# Patient Record
Sex: Female | Born: 2000 | Hispanic: Yes | Marital: Married | Smoking: Never smoker
Health system: Southern US, Community
[De-identification: ages and names within clinical notes are randomized; demographics above are authoritative.]

## PROBLEM LIST (undated history)

## (undated) DIAGNOSIS — H814 Vertigo of central origin: Secondary | ICD-10-CM

## (undated) DIAGNOSIS — J45909 Unspecified asthma, uncomplicated: Secondary | ICD-10-CM

## (undated) DIAGNOSIS — R55 Syncope and collapse: Secondary | ICD-10-CM

## (undated) DIAGNOSIS — F419 Anxiety disorder, unspecified: Secondary | ICD-10-CM

## (undated) DIAGNOSIS — J329 Chronic sinusitis, unspecified: Secondary | ICD-10-CM

## (undated) DIAGNOSIS — G8929 Other chronic pain: Secondary | ICD-10-CM

## (undated) DIAGNOSIS — G43829 Menstrual migraine, not intractable, without status migrainosus: Secondary | ICD-10-CM

## (undated) DIAGNOSIS — F32A Depression, unspecified: Secondary | ICD-10-CM

## (undated) DIAGNOSIS — G4730 Sleep apnea, unspecified: Secondary | ICD-10-CM

## (undated) DIAGNOSIS — A64 Unspecified sexually transmitted disease: Secondary | ICD-10-CM

## (undated) DIAGNOSIS — M5430 Sciatica, unspecified side: Secondary | ICD-10-CM

## (undated) DIAGNOSIS — G43839 Menstrual migraine, intractable, without status migrainosus: Secondary | ICD-10-CM

## (undated) DIAGNOSIS — R519 Headache, unspecified: Secondary | ICD-10-CM

## (undated) DIAGNOSIS — F410 Panic disorder [episodic paroxysmal anxiety] without agoraphobia: Secondary | ICD-10-CM

## (undated) DIAGNOSIS — K2950 Unspecified chronic gastritis without bleeding: Secondary | ICD-10-CM

## (undated) DIAGNOSIS — T7840XA Allergy, unspecified, initial encounter: Secondary | ICD-10-CM

## (undated) HISTORY — DX: Menstrual migraine, intractable, without status migrainosus: G43.839

## (undated) HISTORY — DX: Unspecified sexually transmitted disease: A64

## (undated) HISTORY — DX: Sleep apnea, unspecified: G47.30

## (undated) HISTORY — DX: Anxiety disorder, unspecified: F41.9

## (undated) HISTORY — DX: Depression, unspecified: F32.A

## (undated) HISTORY — DX: Chronic sinusitis, unspecified: J32.9

## (undated) HISTORY — DX: Vertigo of central origin: H81.4

## (undated) HISTORY — DX: Sciatica, unspecified side: M54.30

## (undated) HISTORY — DX: Allergy, unspecified, initial encounter: T78.40XA

## (undated) HISTORY — DX: Syncope and collapse: R55

---
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
## 2018-01-07 ENCOUNTER — Encounter

## 2018-01-07 ENCOUNTER — Ambulatory Visit: Payer: Medicaid Other | Admitting: Neurology

## 2018-01-08 DIAGNOSIS — R569 Unspecified convulsions: Secondary | ICD-10-CM | POA: Insufficient documentation

## 2018-06-24 ENCOUNTER — Ambulatory Visit
Admission: EM | Admit: 2018-06-24 | Discharge: 2018-06-24 | Disposition: A | Discharge status: Home / Self Care | Payer: 59 | Attending: Family Medicine | Admitting: Family Medicine

## 2018-06-24 DIAGNOSIS — R110 Nausea: Secondary | ICD-10-CM | POA: Diagnosis not present

## 2018-06-24 MED ORDER — ONDANSETRON HCL 4 MG PO TABS: 4.0000 mg | ORAL_TABLET | Freq: Four times a day (QID) | ORAL | 0 refills | Status: DC

## 2018-06-24 NOTE — ED Provider Notes (Signed)
[facility]    CSN: [id number] Arrival date & time: 06/24/18  1623     History   Chief Complaint Chief Complaint  Patient presents with  . Emesis    HPI Shakeria Opie is a 18 y.o. female.   HPI  Patient states that she woke up and had breakfast this morning.  She felt well.  As the day went on she started having severe nausea.  She has not had any vomiting but does not feel like she could eat today.  She has been keeping down some water.  No fever chills.  No runny nose.  No cough.  No body aches or flu symptoms.  Past Medical History:  Diagnosis Date  . Asthma   . Menstrual migraine, intractable, without status migrainosus   . Panic attack   . Sinusitis   . Syncope and collapse   . Vertigo of central origin, unspecified ear     Patient Active Problem List   Diagnosis Date Noted  . 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 Asthma 10/27/2016    History reviewed. No pertinent surgical history.  OB History   No obstetric history on file.      Home Medications    Prior to Admission medications   Not on File    Family History Family History  Problem Relation Age of Onset  . Hypertension Mother   . Liver disease Mother   . Other Mother        GENETIC (INHERITED) DISORDER  . Hypertension Father   . Diabetes Father   . Cancer Paternal Grandmother   . Diabetes Paternal Grandmother   . Hypertension Paternal Grandmother   . High Cholesterol Paternal Grandmother   . Cancer Paternal Grandfather   . Diabetes Paternal Grandfather   . Hypertension Paternal Grandfather   . High Cholesterol Paternal Grandfather     Social History Social History   Tobacco Use  . Smoking status: Never Smoker  . Smokeless tobacco: Never Used  Substance Use Topics  . Alcohol use: No    Frequency: Never  . Drug use: No     Allergies   Patient has no known allergies.   Review of Systems Review of Systems  Constitutional: Negative for chills and  fever.  HENT: Negative for ear pain and sore throat.   Eyes: Negative for pain and visual disturbance.  Respiratory: Negative for cough and shortness of breath.   Cardiovascular: Negative for chest pain and palpitations.  Gastrointestinal: Positive for nausea. Negative for abdominal pain and vomiting.  Genitourinary: Negative for dysuria and hematuria.  Musculoskeletal: Negative for arthralgias and back pain.  Skin: Negative for color change and rash.  Neurological: Negative for seizures and syncope.  All other systems reviewed and are negative.    Physical Exam Triage Vital Signs ED Triage Vitals [06/24/18 1632]  Enc Vitals Group     BP 120/78     Pulse Rate 76     Resp 18     Temp 98.2 F (36.8 C)     Temp Source Oral     SpO2 98 %     Weight      Height      Head Circumference      Peak Flow      Pain Score 9     Pain Loc      Pain Edu?      Excl. in GC?    No data found.  Updated Vital Signs BP 120/78 (BP  Location: Right Arm)   Pulse 76   Temp 98.2 F (36.8 C) (Oral)   Resp 18   LMP 06/03/2018   SpO2 98%   Visual Acuity Right Eye Distance:   Left Eye Distance:   Bilateral Distance:    Right Eye Near:   Left Eye Near:    Bilateral Near:     Physical Exam Constitutional:      General: She is not in acute distress.    Appearance: She is well-developed.  HENT:     Head: Normocephalic and atraumatic.     Right Ear: Tympanic membrane and ear canal normal.     Left Ear: Tympanic membrane and ear canal normal.     Nose: Nose normal.     Mouth/Throat:     Mouth: Mucous membranes are moist.     Pharynx: No posterior oropharyngeal erythema.  Eyes:     Conjunctiva/sclera: Conjunctivae normal.     Pupils: Pupils are equal, round, and reactive to light.  Neck:     Musculoskeletal: Normal range of motion.  Cardiovascular:     Rate and Rhythm: Normal rate and regular rhythm.     Heart sounds: Normal heart sounds.  Pulmonary:     Effort: Pulmonary effort  is normal. No respiratory distress.     Breath sounds: Normal breath sounds.  Abdominal:     General: There is no distension.     Palpations: Abdomen is soft.  Musculoskeletal: Normal range of motion.  Skin:    General: Skin is warm and dry.  Neurological:     Mental Status: She is alert. Mental status is at baseline.      UC Treatments / Results  Labs (all labs ordered are listed, but only abnormal results are displayed) Labs Reviewed - No data to display  EKG None  Radiology No results found.  Procedures Procedures (including critical care time)  Medications Ordered in UC Medications - No data to display  Initial Impression / Assessment and Plan / UC Course  I have reviewed the triage vital signs and the nursing notes.  Pertinent labs & imaging results that were available during my care of the patient were reviewed by me and considered in my medical decision making (see chart for details).     I was described to the patient I believe she has a viral illness.  Reviewed clear liquids.  Advance to bland diet.  At that point she asked for a blood pregnancy test.  Apparently she had some unprotected sex in the last couple of weeks.  Her next period is not even due for 2 weeks.  I told her it was too soon to get a pregnancy test.  Recommended safe sex.  Pregnancy testing of her.  Does not come as expected Final Clinical Impressions(s) / UC Diagnoses   Final diagnoses:  Nausea without vomiting     Discharge Instructions     Drink plenty of fluids Take zofran as needed nausea Return if not better in a few days or call your PCP    ED Prescriptions    None     Controlled Substance Prescriptions [place] Controlled Substance Registry consulted? Not Applicable   Eustace MooreNelson, Chareese Sergent Sue, MD 06/24/18 [id number]

## 2018-06-24 NOTE — Discharge Instructions (Addendum)
Drink plenty of fluids Take zofran as needed nausea Return if not better in a few days or call your PCP

## 2018-06-24 NOTE — ED Triage Notes (Signed)
Pt c/o n/v x10hrs, able to keep only water down. C/o chest tightness when coughing

## 2018-07-21 ENCOUNTER — Encounter (HOSPITAL_COMMUNITY): Payer: Self-pay

## 2018-07-21 ENCOUNTER — Other Ambulatory Visit: Payer: Self-pay

## 2018-07-21 ENCOUNTER — Ambulatory Visit (HOSPITAL_COMMUNITY)
Admission: EM | Admit: 2018-07-21 | Discharge: 2018-07-21 | Disposition: A | Discharge status: Home / Self Care | Payer: 59 | Attending: Family Medicine | Admitting: Family Medicine

## 2018-07-21 DIAGNOSIS — R05 Cough: Secondary | ICD-10-CM

## 2018-07-21 DIAGNOSIS — Z3202 Encounter for pregnancy test, result negative: Secondary | ICD-10-CM | POA: Diagnosis not present

## 2018-07-21 DIAGNOSIS — B9789 Other viral agents as the cause of diseases classified elsewhere: Secondary | ICD-10-CM | POA: Diagnosis not present

## 2018-07-21 DIAGNOSIS — J029 Acute pharyngitis, unspecified: Secondary | ICD-10-CM | POA: Diagnosis not present

## 2018-07-21 DIAGNOSIS — R1030 Lower abdominal pain, unspecified: Secondary | ICD-10-CM | POA: Diagnosis not present

## 2018-07-21 DIAGNOSIS — R112 Nausea with vomiting, unspecified: Secondary | ICD-10-CM | POA: Insufficient documentation

## 2018-07-21 DIAGNOSIS — J069 Acute upper respiratory infection, unspecified: Secondary | ICD-10-CM | POA: Diagnosis not present

## 2018-07-21 LAB — POCT URINALYSIS DIP (DEVICE)
BILIRUBIN URINE: NEGATIVE
Glucose, UA: NEGATIVE mg/dL
HGB URINE DIPSTICK: NEGATIVE
KETONES UR: NEGATIVE mg/dL
Leukocytes,Ua: NEGATIVE
NITRITE: NEGATIVE
PH: 5.5 (ref 5.0–8.0)
Protein, ur: NEGATIVE mg/dL
SPECIFIC GRAVITY, URINE: 1.025 (ref 1.005–1.030)
Urobilinogen, UA: 0.2 mg/dL (ref 0.0–1.0)

## 2018-07-21 LAB — POCT PREGNANCY, URINE: Preg Test, Ur: NEGATIVE

## 2018-07-21 LAB — POCT RAPID STREP A: Streptococcus, Group A Screen (Direct): NEGATIVE

## 2018-07-21 MED ORDER — ONDANSETRON 4 MG PO TBDP: 4.0000 mg | ORAL_TABLET | Freq: Three times a day (TID) | ORAL | 0 refills | Status: DC | PRN

## 2018-07-21 MED ORDER — BENZONATATE 200 MG PO CAPS: 200.0000 mg | ORAL_CAPSULE | Freq: Three times a day (TID) | ORAL | 0 refills | Status: AC | PRN

## 2018-07-21 MED ORDER — CETIRIZINE HCL 10 MG PO CAPS: 10.0000 mg | ORAL_CAPSULE | Freq: Every day | ORAL | 0 refills | Status: DC

## 2018-07-21 NOTE — ED Triage Notes (Signed)
Pt presents to [facility] for cough and abdominal pain since yesterday 07/20/2018, pt also complains of pain in kidney area, nausea, vomiting and loss of appetite

## 2018-07-21 NOTE — Discharge Instructions (Signed)
Your urine was normal; strep test negative Your symptoms are most likely from a viral illness, symptoms should resolve on their own and next 5 to 7 days Please use Zofran, dissolved underneath time, use as needed for nausea and vomiting For your cough you may use Tessalon or over-the-counter Delsym, Robitussin, Mucinex DM Begin daily cetirizine to help with nasal congestion and drainage that may be contributing to sore throat Please push fluids, may use Pedialyte or Gatorade to supplement if not eating a lot of solid foods Please continue to monitor your abdominal pain, follow-up if pain worsening; follow-up with developing persistent vomiting, unable to eat or drink

## 2018-07-22 NOTE — ED Provider Notes (Signed)
[facility]    CSN: [id number] Arrival date & time: 07/21/18  1644     History   Chief Complaint Chief Complaint  Patient presents with  . Cough  . Abdominal Pain    HPI Rebecca Rocha is a 18 y.o. female history of asthma, presenting today for evaluation of cough, abdominal pain and nausea and vomiting.  Patient states that symptoms began yesterday.  She has had cough with mild rhinorrhea, does also endorse sore throat.  She has also had associated abdominal discomfort with nausea and vomiting.  One episode of vomiting yesterday, denies vomiting today.  She has had associated headaches.  Denies any urinary symptoms of increased frequency had one episode of dysuria yesterday but this is not been persistent.  Denies hematuria.  Denies history of kidney stones but does have family history of kidney stones.  Denies any vaginal discharge or pelvic pain.  Last menstrual period 2/27.  She is not currently on any form of birth control as she is transitioning from OCPs and wanting to have the Nexplanon.  She notes that she did have a fever of 100.8 earlier, but has not taken anything for the fever.  She has not taken any thing for symptoms.  HPI  Past Medical History:  Diagnosis Date  . Asthma   . Menstrual migraine, intractable, without status migrainosus   . Panic attack   . Sinusitis   . Syncope and collapse   . Vertigo of central origin, unspecified ear     Patient Active Problem List   Diagnosis Date Noted  . 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 Asthma 10/27/2016    History reviewed. No pertinent surgical history.  OB History   No obstetric history on file.      Home Medications    Prior to Admission medications   Medication Sig Start Date End Date Taking? Authorizing Provider  ondansetron (ZOFRAN) 4 MG tablet Take 1 tablet (4 mg total) by mouth every 6 (six) hours. 06/24/18  Yes Eustace Moore, MD  benzonatate (TESSALON) 200 MG capsule Take 1  capsule (200 mg total) by mouth 3 (three) times daily as needed for up to 7 days for cough. 07/21/18 07/28/18  Jordin Vicencio C, PA-C  Cetirizine HCl 10 MG CAPS Take 1 capsule (10 mg total) by mouth daily for 10 days. 07/21/18 07/31/18  Niley Helbig C, PA-C  ondansetron (ZOFRAN ODT) 4 MG disintegrating tablet Take 1 tablet (4 mg total) by mouth every 8 (eight) hours as needed for nausea or vomiting. 07/21/18   Baneen Wieseler, Junius Creamer, PA-C    Family History Family History  Problem Relation Age of Onset  . Hypertension Mother   . Liver disease Mother   . Other Mother        GENETIC (INHERITED) DISORDER  . Hypertension Father   . Diabetes Father   . Cancer Paternal Grandmother   . Diabetes Paternal Grandmother   . Hypertension Paternal Grandmother   . High Cholesterol Paternal Grandmother   . Cancer Paternal Grandfather   . Diabetes Paternal Grandfather   . Hypertension Paternal Grandfather   . High Cholesterol Paternal Grandfather     Social History Social History   Tobacco Use  . Smoking status: Never Smoker  . Smokeless tobacco: Never Used  Substance Use Topics  . Alcohol use: No    Frequency: Never  . Drug use: No     Allergies   Patient has no known allergies.   Review of Systems  Review of Systems  Constitutional: Positive for appetite change, fatigue and fever. Negative for activity change and chills.  HENT: Positive for congestion, rhinorrhea and sore throat. Negative for ear pain, sinus pressure and trouble swallowing.   Eyes: Negative for discharge and redness.  Respiratory: Positive for cough. Negative for chest tightness and shortness of breath.   Cardiovascular: Negative for chest pain.  Gastrointestinal: Positive for abdominal pain, nausea and vomiting. Negative for diarrhea.  Musculoskeletal: Negative for myalgias.  Skin: Negative for rash.  Neurological: Negative for dizziness, light-headedness and headaches.     Physical Exam Triage Vital Signs ED Triage  Vitals  Enc Vitals Group     BP 07/21/18 1731 113/72     Pulse Rate 07/21/18 1731 79     Resp 07/21/18 1731 17     Temp 07/21/18 1731 97.9 F (36.6 C)     Temp Source 07/21/18 1731 Oral     SpO2 07/21/18 1731 100 %     Weight --      Height --      Head Circumference --      Peak Flow --      Pain Score 07/21/18 1733 9     Pain Loc --      Pain Edu? --      Excl. in GC? --    No data found.  Updated Vital Signs BP 113/72 (BP Location: Left Arm)   Pulse 79   Temp 97.9 F (36.6 C) (Oral)   Resp 17   LMP 07/11/2018 (Exact Date)   SpO2 100%   Visual Acuity Right Eye Distance:   Left Eye Distance:   Bilateral Distance:    Right Eye Near:   Left Eye Near:    Bilateral Near:     Physical Exam Vitals signs and nursing note reviewed.  Constitutional:      General: She is not in acute distress.    Appearance: She is well-developed.  HENT:     Head: Normocephalic and atraumatic.     Ears:     Comments: Bilateral ears without tenderness to palpation of external auricle, tragus and mastoid, EAC's without erythema or swelling, TM's with good bony landmarks and cone of light. Non erythematous.     Mouth/Throat:     Comments: Oral mucosa pink and moist, no tonsillar enlargement or exudate. Posterior pharynx patent and nonerythematous, no uvula deviation or swelling. Normal phonation. Eyes:     Conjunctiva/sclera: Conjunctivae normal.  Neck:     Musculoskeletal: Neck supple.  Cardiovascular:     Rate and Rhythm: Normal rate and regular rhythm.     Heart sounds: No murmur.  Pulmonary:     Effort: Pulmonary effort is normal. No respiratory distress.     Breath sounds: Normal breath sounds.     Comments: Breathing comfortably at rest, CTABL, no wheezing, rales or other adventitious sounds auscultated Abdominal:     Palpations: Abdomen is soft.     Tenderness: There is abdominal tenderness.     Comments: Mild tenderness to bilateral lower quadrants of abdomen, negative  rebound, negative McBurney's, negative Rovsing  Skin:    General: Skin is warm and dry.  Neurological:     Mental Status: She is alert.      UC Treatments / Results  Labs (all labs ordered are listed, but only abnormal results are displayed) Labs Reviewed  CULTURE, GROUP A STREP [facility])  POCT URINALYSIS DIP (DEVICE)  POCT PREGNANCY, URINE  POCT RAPID STREP A  EKG None  Radiology No results found.  Procedures Procedures (including critical care time)  Medications Ordered in UC Medications - No data to display  Initial Impression / Assessment and Plan / UC Course  I have reviewed the triage vital signs and the nursing notes.  Pertinent labs & imaging results that were available during my care of the patient were reviewed by me and considered in my medical decision making (see chart for details).     UA negative.  Most likely viral URI, viral gastroenteritis.  She has not had persistent vomiting or diarrhea.  Possible influenza with constellation of symptoms but no fever in clinic today.  Abdominal exam negative for peritoneal signs.  Lungs clear.  Strep test negative.  At this time will treat symptomatically and supportively for likely viral illness.  Recommendations below, provided Zofran to use as needed for nausea, push fluids.Discussed strict return precautions. Patient verbalized understanding and is agreeable with plan.  Final Clinical Impressions(s) / UC Diagnoses   Final diagnoses:  Non-intractable vomiting with nausea, unspecified vomiting type  Lower abdominal pain  Viral URI with cough     Discharge Instructions     Your urine was normal; strep test negative Your symptoms are most likely from a viral illness, symptoms should resolve on their own and next 5 to 7 days Please use Zofran, dissolved underneath time, use as needed for nausea and vomiting For your cough you may use Tessalon or over-the-counter Delsym, Robitussin, Mucinex DM Begin daily  cetirizine to help with nasal congestion and drainage that may be contributing to sore throat Please push fluids, may use Pedialyte or Gatorade to supplement if not eating a lot of solid foods Please continue to monitor your abdominal pain, follow-up if pain worsening; follow-up with developing persistent vomiting, unable to eat or drink   ED Prescriptions    Medication Sig Dispense Auth. Provider   benzonatate (TESSALON) 200 MG capsule Take 1 capsule (200 mg total) by mouth 3 (three) times daily as needed for up to 7 days for cough. 28 capsule Brayden Betters C, PA-C   Cetirizine HCl 10 MG CAPS Take 1 capsule (10 mg total) by mouth daily for 10 days. 10 capsule Mirayah Wren C, PA-C   ondansetron (ZOFRAN ODT) 4 MG disintegrating tablet Take 1 tablet (4 mg total) by mouth every 8 (eight) hours as needed for nausea or vomiting. 20 tablet Nayellie Sanseverino, EddystoneHallie C, PA-C     Controlled Substance Prescriptions [place] Controlled Substance Registry consulted? Not Applicable   Lew DawesWieters, Suman Trivedi C, [place]PA-C 07/22/18 1051

## 2018-07-23 LAB — CULTURE, GROUP A STREP (THRC)

## 2018-07-24 ENCOUNTER — Telehealth (HOSPITAL_COMMUNITY): Payer: Self-pay | Admitting: Emergency Medicine

## 2018-07-24 NOTE — Telephone Encounter (Signed)
Culture is positive for non group A Strep germ.  This is a finding of uncertain significance; not the typical 'strep throat' germ.  Attempted to reach patient. No answer at this time.  

## 2018-08-13 ENCOUNTER — Ambulatory Visit: Payer: Self-pay | Admitting: Obstetrics & Gynecology

## 2018-08-13 ENCOUNTER — Other Ambulatory Visit: Payer: Self-pay

## 2018-08-15 ENCOUNTER — Ambulatory Visit (INDEPENDENT_AMBULATORY_CARE_PROVIDER_SITE_OTHER): Payer: 59 | Admitting: Obstetrics & Gynecology

## 2018-08-15 ENCOUNTER — Encounter: Payer: Self-pay | Admitting: Obstetrics & Gynecology

## 2018-08-15 ENCOUNTER — Other Ambulatory Visit: Payer: Self-pay

## 2018-08-15 VITALS — BP 112/70 | Ht 60.75 in | Wt 142.0 lb

## 2018-08-15 DIAGNOSIS — Z3009 Encounter for other general counseling and advice on contraception: Secondary | ICD-10-CM | POA: Diagnosis not present

## 2018-08-15 DIAGNOSIS — Z113 Encounter for screening for infections with a predominantly sexual mode of transmission: Secondary | ICD-10-CM

## 2018-08-15 DIAGNOSIS — Z01419 Encounter for gynecological examination (general) (routine) without abnormal findings: Secondary | ICD-10-CM

## 2018-08-15 NOTE — Progress Notes (Signed)
Rebecca Rocha 07-Aug-2000 [id number]   History:    18 y.o. G0 Single.  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  RP:  New patient presenting for annual gyn exam   HPI: Normal menstrual periods every 3 to 5 weeks with normal flow.  No breakthrough bleeding.  No pelvic pain.  No pain with intercourse.  Strict condom use.  Would like to have contraception, but difficult compliance with birth control pills in the past, thinking of Nexplanon.  Breast normal.  Urine and bowel movements normal.  Body mass index 27.05.  Physically active.  Past medical history,surgical history, family history and social history were all reviewed and documented in the EPIC chart.  Gynecologic History Patient's last menstrual period was 07/28/2018. Contraception: condoms Last Pap: Never Last mammogram: Never Bone Density: Never Colonoscopy: Never  Obstetric History OB History  Gravida Para Term Preterm AB Living  0 0 0 0 0 0  SAB TAB Ectopic Multiple Live Births  0 0 0 0 0     ROS: A ROS was performed and pertinent positives and negatives are included in the history.  GENERAL: No fevers or chills. HEENT: No change in vision, no earache, sore throat or sinus congestion. NECK: No pain or stiffness. CARDIOVASCULAR: No chest pain or pressure. No palpitations. PULMONARY: No shortness of breath, cough or wheeze. GASTROINTESTINAL: No abdominal pain, nausea, vomiting or diarrhea, melena or bright red blood per rectum. GENITOURINARY: No urinary frequency, urgency, hesitancy or dysuria. MUSCULOSKELETAL: No joint or muscle pain, no back pain, no recent trauma. DERMATOLOGIC: No rash, no itching, no lesions. ENDOCRINE: No polyuria, polydipsia, no heat or cold intolerance. No recent change in weight. HEMATOLOGICAL: No anemia or easy bruising or bleeding. NEUROLOGIC: No headache, seizures, numbness, tingling or weakness. PSYCHIATRIC: No depression, no loss of interest in normal activity or change in sleep  pattern.     Exam:   BP 112/70   Ht 5' 0.75" (1.543 m)   Wt 142 lb (64.4 kg)   LMP 07/28/2018   BMI 27.05 kg/m   Body mass index is 27.05 kg/m.  General appearance : Well developed well nourished female. No acute distress HEENT: Eyes: no retinal hemorrhage or exudates,  Neck supple, trachea midline, no carotid bruits, no thyroidmegaly Lungs: Clear to auscultation, no rhonchi or wheezes, or rib retractions  Heart: Regular rate and rhythm, no murmurs or gallops Breast:Examined in sitting and supine position were symmetrical in appearance, no palpable masses or tenderness,  no skin retraction, no nipple inversion, no nipple discharge, no skin discoloration, no axillary or supraclavicular lymphadenopathy Abdomen: no palpable masses or tenderness, no rebound or guarding Extremities: no edema or skin discoloration or tenderness  Pelvic: Vulva: Normal             Vagina: No gross lesions or discharge  Cervix: No gross lesions or discharge.  Gono-Chlam done.  Uterus  AV, normal size, shape and consistency, non-tender and mobile  Adnexa  Without masses or tenderness  Anus: Normal   Assessment/Plan:  18 y.o. female for annual exam   1. Well female exam with routine gynecological exam Normal gynecologic exam.  Will start Pap test at age 58.  Breast exam normal.  HPV immunization discussed and pamphlet given, if not done previously patient will follow-up for first injection.  2. Encounter for other general counseling or advice on contraception Contraceptive counseling done.  Decision to proceed with Nexplanon.  Strict condom use until then and patient will  follow-up on next menstrual period.  Usage, insertion, risks and benefits of Nexplanon reviewed.  Nexplanon pamphlet given.  3. Screen for STD (sexually transmitted disease) Strict condom use recommended. - HIV antibody (with reflex) - RPR - Hepatitis C Antibody - Hepatitis B Surface AntiGEN - C. trachomatis/N. gonorrhoeae RNA   [name] MD, 9:35 AM 08/15/2018

## 2018-08-15 NOTE — Patient Instructions (Signed)
1. Well female exam with routine gynecological exam Normal gynecologic exam.  Will start Pap test at age 18.  Breast exam normal.  HPV immunization discussed and pamphlet given, if not done previously patient will follow-up for first injection.  2. Encounter for other general counseling or advice on contraception Contraceptive counseling done.  Decision to proceed with Nexplanon.  Strict condom use until then and patient will follow-up on next menstrual period.  Usage, insertion, risks and benefits of Nexplanon reviewed.  Nexplanon pamphlet given.  3. Screen for STD (sexually transmitted disease) Strict condom use recommended. - HIV antibody (with reflex) - RPR - Hepatitis C Antibody - Hepatitis B Surface AntiGEN - C. trachomatis/N. gonorrhoeae RNA  Rebecca Rocha, it was a pleasure seeing you today!  I will inform you of your results as soon as they are available.

## 2018-08-16 LAB — RPR: RPR Ser Ql: NONREACTIVE

## 2018-08-16 LAB — C. TRACHOMATIS/N. GONORRHOEAE RNA
C. trachomatis RNA, TMA: NOT DETECTED
N. gonorrhoeae RNA, TMA: NOT DETECTED

## 2018-08-16 LAB — HEPATITIS C ANTIBODY
Hepatitis C Ab: NONREACTIVE
SIGNAL TO CUT-OFF: 0.02 (ref <1.00)

## 2018-08-16 LAB — HIV ANTIBODY (ROUTINE TESTING W REFLEX): HIV 1&2 Ab, 4th Generation: NONREACTIVE

## 2018-08-16 LAB — HEPATITIS B SURFACE ANTIGEN: Hepatitis B Surface Ag: NONREACTIVE

## 2018-08-28 ENCOUNTER — Other Ambulatory Visit: Payer: Self-pay

## 2018-08-30 ENCOUNTER — Other Ambulatory Visit: Payer: Self-pay

## 2018-08-30 ENCOUNTER — Encounter: Payer: Self-pay | Admitting: Obstetrics & Gynecology

## 2018-08-30 ENCOUNTER — Ambulatory Visit (INDEPENDENT_AMBULATORY_CARE_PROVIDER_SITE_OTHER): Payer: 59 | Admitting: Obstetrics & Gynecology

## 2018-08-30 DIAGNOSIS — Z30017 Encounter for initial prescription of implantable subdermal contraceptive: Secondary | ICD-10-CM | POA: Diagnosis not present

## 2018-08-30 NOTE — Patient Instructions (Signed)
1. Nexplanon insertion Contraception with Nexplanon decided with counseling at previous visit.  Patient on her normal menstrual period currently.  Strict condom use.  Nexplanon insertion procedure reviewed with patient.  Easy insertion of Nexplanon without complication.  Well-tolerated by patient.  Post Nexplanon insertion precautions discussed.  Rebecca Rocha, it was a pleasure seeing you today!

## 2018-08-30 NOTE — Progress Notes (Signed)
° ° °  Rebecca Rocha 2000/06/11 [id number]        18 y.o.  G0 Single.  Boyfriend x 1 year  RP: Nexplanon insertion  HPI: LMP 08/26/2018, heavier flow x yesterday.  No pelvic pain.  No vaginal discharge.  No change x Annual/Gyn exam 08/15/2018.   OB History  Gravida Para Term Preterm AB Living  0 0 0 0 0 0  SAB TAB Ectopic Multiple Live Births  0 0 0 0 0    Past medical history,surgical history, problem list, medications, allergies, family history and social history were all reviewed and documented in the EPIC chart.   Directed ROS with pertinent positives and negatives documented in the history of present illness/assessment and plan.  Exam:  There were no vitals filed for this visit. General appearance:  Normal                                              Nexplanon Procedure Note (insertion)   The patient was laying on her back with her nondominant arm flexed at the elbow and externally rotated. The insertion site was identified as the underside of the nondominant upper arm approximately 8 cm from the medial epicondyle of the humerus.  A mark was made with a sterile marker at the spot where the Nexplanon implant will be inserted. The area was cleansed with Betadine solution. The area was anesthetized with 1% lidocaine  (1 cc)  at the area the injection site and underneath the skin along the planned insertion tunnel. The preloaded disposable Nexplanon was removed from its sterile casing.  The applicator was held above the needle at the textured surface area. The transparent protector was removed. With a freehand, the skin was stretched around the insertion site with a thumb and index finger. The skin was then punctured with the tip of the needle angled at 30. The Nexplanon applicator was lowered to a horizontal position. While lifting the skin with the tip of the needle the needle was then slid to its full length. The applicator was kept in this position with the needle inserted to  its full length. The purple slider was unlocked by pushing it slightly downward. The slider was fully moved back until it stopped. This allowed the implant to be in the final subdermal position and the needle to be locked inside the body of the applicator. The applicator was then removed. 3 Steri-Strips were applied over the incision, a band-aid and a bandage was placed which the patient is to remove tomorrow. No complications, the patient tolerated procedure well and was released home with instructions.   Assessment/Plan:  18 y.o. G0  1. Nexplanon insertion Contraception with Nexplanon decided with counseling at previous visit.  Patient on her normal menstrual period currently.  Strict condom use.  Nexplanon insertion procedure reviewed with patient.  Easy insertion of Nexplanon without complication.  Well-tolerated by patient.  Post Nexplanon insertion precautions discussed.  [name] MD, 12:26 PM 08/30/2018

## 2018-09-02 ENCOUNTER — Encounter: Payer: Self-pay | Admitting: Anesthesiology

## 2018-09-16 ENCOUNTER — Other Ambulatory Visit: Payer: Self-pay

## 2018-09-16 ENCOUNTER — Emergency Department (HOSPITAL_COMMUNITY): Payer: 59

## 2018-09-16 ENCOUNTER — Encounter (HOSPITAL_COMMUNITY): Payer: Self-pay | Admitting: *Deleted

## 2018-09-16 ENCOUNTER — Emergency Department (HOSPITAL_COMMUNITY)
Admission: EM | Admit: 2018-09-16 | Discharge: 2018-09-16 | Disposition: A | Discharge status: Home / Self Care | Payer: 59 | Attending: Emergency Medicine | Admitting: Emergency Medicine

## 2018-09-16 DIAGNOSIS — J069 Acute upper respiratory infection, unspecified: Secondary | ICD-10-CM

## 2018-09-16 DIAGNOSIS — J45909 Unspecified asthma, uncomplicated: Secondary | ICD-10-CM | POA: Insufficient documentation

## 2018-09-16 DIAGNOSIS — R05 Cough: Secondary | ICD-10-CM | POA: Diagnosis present

## 2018-09-16 NOTE — Discharge Instructions (Addendum)
Your chest x-ray is normal. Home to quarantine for 7 days after symptoms onset and at least 72 hours fever free.  Tylenol as needed as directed for fever.  Recheck with your doctor, return to ER for new or worsening symptoms.

## 2018-09-16 NOTE — ED Provider Notes (Signed)
[facility] Note   CSN: [id number] Arrival date & time: 09/16/18  1641    History   Chief Complaint No chief complaint on file.   HPI Rebecca Rocha is a 18 y.o. female.     18 year old female presents with complaint of nonproductive cough x2 days, patient states that her chest feels sore when she coughs.  Patient has a history of asthma, is a non-smoker.  Patient reports fever of 101 today, taking Motrin.  No known sick contacts, denies wheezing or SHOB. No other complaints or concerns.  Rebecca Rocha was evaluated in Emergency Department on 09/16/2018 for the symptoms described in the history of present illness. She was evaluated in the context of the global COVID-19 pandemic, which necessitated consideration that the patient might be at risk for infection with the SARS-CoV-2 virus that causes COVID-19. Institutional protocols and algorithms that pertain to the evaluation of patients at risk for COVID-19 are in a state of rapid change based on information released by regulatory bodies including the CDC and federal and state organizations. These policies and algorithms were followed during the patient's care in the ED.      Past Medical History:  Diagnosis Date  . Asthma   . Menstrual migraine, intractable, without status migrainosus   . Panic attack   . Sinusitis   . Syncope and collapse   . Vertigo of central origin, unspecified ear     Patient Active Problem List   Diagnosis Date Noted  . 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 Asthma 10/27/2016    History reviewed. No pertinent surgical history.   OB History    Gravida  0   Para  0   Term  0   Preterm  0   AB  0   Living  0     SAB  0   TAB  0   Ectopic  0   Multiple  0   Live Births  0            Home Medications    Prior to Admission medications   Not on File    Family History Family History  Problem  Relation Age of Onset  . Hypertension Mother   . Liver disease Mother   . Other Mother        GENETIC (INHERITED) DISORDER  . Hypertension Father   . Diabetes Father   . Cancer Paternal Grandmother   . Diabetes Paternal Grandmother   . Hypertension Paternal Grandmother   . High Cholesterol Paternal Grandmother   . Cancer Paternal Grandfather   . Diabetes Paternal Grandfather   . Hypertension Paternal Grandfather   . High Cholesterol Paternal Grandfather     Social History Social History   Tobacco Use  . Smoking status: Never Smoker  . Smokeless tobacco: Never Used  Substance Use Topics  . Alcohol use: No    Frequency: Never  . Drug use: No     Allergies   Patient has no known allergies.   Review of Systems Review of Systems  Constitutional: Positive for fever.  HENT: Negative for congestion, sneezing and sore throat.   Respiratory: Positive for cough. Negative for shortness of breath and wheezing.   Cardiovascular: Positive for chest pain.  Gastrointestinal: Negative for nausea and vomiting.  Musculoskeletal: Negative for arthralgias and myalgias.  Skin: Negative for rash and wound.  Allergic/Immunologic: Negative for immunocompromised state.  Hematological: Negative for adenopathy.  Psychiatric/Behavioral: Negative for confusion.  All other systems reviewed and are negative.    Physical Exam Updated Vital Signs BP 106/72 (BP Location: Right Arm)   Pulse 84   Temp 99.6 F (37.6 C) (Oral)   Resp 18   LMP 08/17/2018   SpO2 100%   Physical Exam Vitals signs and nursing note reviewed.  Constitutional:      General: She is not in acute distress.    Appearance: She is well-developed. She is not diaphoretic.  HENT:     Head: Normocephalic and atraumatic.  Cardiovascular:     Rate and Rhythm: Normal rate and regular rhythm.     Pulses: Normal pulses.     Heart sounds: Normal heart sounds.  Pulmonary:     Effort: Pulmonary effort is normal.     Breath  sounds: Normal breath sounds. No wheezing.  Chest:     Chest wall: Tenderness present. No crepitus.    Skin:    General: Skin is warm and dry.  Neurological:     Mental Status: She is alert and oriented to person, place, and time.  Psychiatric:        Behavior: Behavior normal.      ED Treatments / Results  Labs (all labs ordered are listed, but only abnormal results are displayed) Labs Reviewed - No data to display  EKG None  Radiology Dg Chest [facility] 1 View  Result Date: 09/16/2018 CLINICAL DATA:  Cough, fever EXAM: PORTABLE CHEST 1 VIEW COMPARISON:  09/08/2017 FINDINGS: Heart and mediastinal contours are within normal limits. No focal opacities or effusions. No acute bony abnormality. IMPRESSION: No active disease. Electronically Signed   By: Charlett Nose M.D.   On: 09/16/2018 22:35    Procedures Procedures (including critical care time)  Medications Ordered in ED Medications - No data to display   Initial Impression / Assessment and Plan / ED Course  I have reviewed the triage vital signs and the nursing notes.  Pertinent labs & imaging results that were available during my care of the patient were reviewed by me and considered in my medical decision making (see chart for details).  Clinical Course as of Sep 16 2310  Mon Sep 16, 2018  2633 18 year old female presents with complaint of nonproductive cough with fever x2 days.  Patient is well-appearing with normal vital signs, lung sounds clear.  Chest x-ray is unremarkable.  Patient advised to go home and quarantine until fever free x72 hours and symptoms x1 week.  Recheck with PCP as needed, return to ER for worsening or concerning symptoms.   [LM]    Clinical Course User Index [LM] Jeannie Fend, PA-C      Final Clinical Impressions(s) / ED Diagnoses   Final diagnoses:  Upper respiratory infection, viral    ED Discharge Orders    None       Alden Hipp 09/16/18 2312    Gerhard Munch,  MD 09/17/18 0002

## 2018-09-16 NOTE — ED Triage Notes (Signed)
Pt in c/o migraine with hx of migraine, onset x 2 days, pt reports productive cough with white sputum, pt A&O x4, afebrile in triage

## 2019-06-10 ENCOUNTER — Other Ambulatory Visit: Payer: Self-pay | Admitting: Family Medicine

## 2019-06-10 DIAGNOSIS — N6002 Solitary cyst of left breast: Secondary | ICD-10-CM

## 2019-06-10 DIAGNOSIS — N6310 Unspecified lump in the right breast, unspecified quadrant: Secondary | ICD-10-CM

## 2019-06-12 ENCOUNTER — Other Ambulatory Visit: Payer: Self-pay

## 2019-06-12 ENCOUNTER — Ambulatory Visit
Admission: RE | Admit: 2019-06-12 | Discharge: 2019-06-12 | Disposition: A | Discharge status: Home / Self Care | Payer: 59 | Source: Ambulatory Visit | Attending: Family Medicine | Admitting: Family Medicine

## 2019-06-12 ENCOUNTER — Other Ambulatory Visit: Payer: Self-pay | Admitting: Family Medicine

## 2019-06-12 DIAGNOSIS — N6310 Unspecified lump in the right breast, unspecified quadrant: Secondary | ICD-10-CM

## 2019-06-12 DIAGNOSIS — N6002 Solitary cyst of left breast: Secondary | ICD-10-CM

## 2019-06-17 ENCOUNTER — Other Ambulatory Visit: Payer: Self-pay

## 2019-06-17 ENCOUNTER — Ambulatory Visit
Admission: EM | Admit: 2019-06-17 | Discharge: 2019-06-17 | Disposition: A | Discharge status: Home / Self Care | Payer: No Typology Code available for payment source | Attending: Physician Assistant | Admitting: Physician Assistant

## 2019-06-17 DIAGNOSIS — L03113 Cellulitis of right upper limb: Secondary | ICD-10-CM | POA: Diagnosis not present

## 2019-06-17 DIAGNOSIS — L089 Local infection of the skin and subcutaneous tissue, unspecified: Secondary | ICD-10-CM | POA: Diagnosis not present

## 2019-06-17 DIAGNOSIS — B9689 Other specified bacterial agents as the cause of diseases classified elsewhere: Secondary | ICD-10-CM | POA: Diagnosis not present

## 2019-06-17 DIAGNOSIS — T148XXA Other injury of unspecified body region, initial encounter: Secondary | ICD-10-CM

## 2019-06-17 MED ORDER — TETANUS-DIPHTH-ACELL PERTUSSIS 5-2.5-18.5 LF-MCG/0.5 IM SUSP
0.5000 mL | Freq: Once | INTRAMUSCULAR | Status: AC
  Administered 2019-06-17: 0.5 mL via INTRAMUSCULAR

## 2019-06-17 MED ORDER — CEPHALEXIN 500 MG PO CAPS: 500.0000 mg | ORAL_CAPSULE | Freq: Four times a day (QID) | ORAL | 0 refills | Status: DC

## 2019-06-17 MED ORDER — MUPIROCIN 2 % EX OINT: 1.0000 "application " | TOPICAL_OINTMENT | Freq: Two times a day (BID) | CUTANEOUS | 0 refills | Status: DC

## 2019-06-17 NOTE — ED Triage Notes (Signed)
Pt states cut her rt wrist with a mirror on Sunday and is still having bleeding. States has changed the bandage 5 times since Sunday d/t bleeding. States has a small abrasion to rt posterior forearm.

## 2019-06-17 NOTE — ED Provider Notes (Signed)
[facility]    CSN: [id number] Arrival date & time: 06/17/19  1623      History   Chief Complaint Chief Complaint  Patient presents with  . Laceration    HPI Rebecca Rocha is a 19 y.o. female.   19 year old female comes in for right wrist laceration she sustained 3 days ago. States was cut by a mirror. She cleaned area with water and has been dressing with adhesive bandages. She changes the bandage every 8 hours, more frequently if wound is bleeding. She cleans with water between dressing. Given wound is overlaying wrist joint, she notices bleeding will restart with flexion or extension of the wrist. She denies any drainage to the wound. Has noticed surrounding redness that is spreading. Denies fever, chills, body aches. Unknown last tetanus.     Past Medical History:  Diagnosis Date  . Asthma   . Menstrual migraine, intractable, without status migrainosus   . Panic attack   . Sinusitis   . Syncope and collapse   . Vertigo of central origin, unspecified ear     Patient Active Problem List   Diagnosis Date Noted  . 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 Asthma 10/27/2016    History reviewed. No pertinent surgical history.  OB History    Gravida  0   Para  0   Term  0   Preterm  0   AB  0   Living  0     SAB  0   TAB  0   Ectopic  0   Multiple  0   Live Births  0            Home Medications    Prior to Admission medications   Medication Sig Start Date End Date Taking? Authorizing Provider  cephALEXin (KEFLEX) 500 MG capsule Take 1 capsule (500 mg total) by mouth 4 (four) times daily. 06/17/19   Cathie Hoops, Andrew Soria V, PA-C  mupirocin ointment (BACTROBAN) 2 % Apply 1 application topically 2 (two) times daily. 06/17/19   Belinda Fisher, PA-C    Family History Family History  Problem Relation Age of Onset  . Hypertension Mother   . Liver disease Mother   . Other Mother        GENETIC (INHERITED) DISORDER  . Hypertension  Father   . Diabetes Father   . Cancer Paternal Grandmother   . Diabetes Paternal Grandmother   . Hypertension Paternal Grandmother   . High Cholesterol Paternal Grandmother   . Cancer Paternal Grandfather   . Diabetes Paternal Grandfather   . Hypertension Paternal Grandfather   . High Cholesterol Paternal Grandfather     Social History Social History   Tobacco Use  . Smoking status: Never Smoker  . Smokeless tobacco: Never Used  Substance Use Topics  . Alcohol use: No  . Drug use: No     Allergies   Patient has no known allergies.   Review of Systems Review of Systems  Reason unable to perform ROS: See HPI as above.     Physical Exam Triage Vital Signs ED Triage Vitals [06/17/19 1635]  Enc Vitals Group     BP 114/73     Pulse Rate 71     Resp 16     Temp 98.8 F (37.1 C)     Temp Source Oral     SpO2 98 %     Weight      Height      Head  Circumference      Peak Flow      Pain Score 7     Pain Loc      Pain Edu?      Excl. in [place]?    No data found.  Updated Vital Signs BP 114/73 (BP Location: Left Arm)   Pulse 71   Temp 98.8 F (37.1 C) (Oral)   Resp 16   SpO2 98%   Physical Exam Constitutional:      General: She is not in acute distress.    Appearance: Normal appearance. She is well-developed. She is not toxic-appearing or diaphoretic.  HENT:     Head: Normocephalic and atraumatic.  Eyes:     Conjunctiva/sclera: Conjunctivae normal.     Pupils: Pupils are equal, round, and reactive to light.  Pulmonary:     Effort: Pulmonary effort is normal. No respiratory distress.     Comments: Speaking in full sentences without difficulty Musculoskeletal:     Cervical back: Normal range of motion and neck supple.     Comments: See picture below. approx 1cm laceration to the right flexor wrist. Bleeding controlled. Surrounding erythema with streaking up the arm. Tenderness to palpation without obvious warmth. Full ROM of the wrist. NVI  Skin:     General: Skin is warm and dry.  Neurological:     Mental Status: She is alert and oriented to person, place, and time.         UC Treatments / Results  Labs (all labs ordered are listed, but only abnormal results are displayed) Labs Reviewed - No data to display  EKG   Radiology No results found.  Procedures Procedures (including critical care time)  Medications Ordered in UC Medications  Tdap (BOOSTRIX) injection 0.5 mL (has no administration in time range)    Initial Impression / Assessment and Plan / UC Course  I have reviewed the triage vital signs and the nursing notes.  Pertinent labs & imaging results that were available during my care of the patient were reviewed by me and considered in my medical decision making (see chart for details).    Tetanus updated. Start keflex for cellulitis with streaking. Wound care instructions given. Will have patient dress wound using ace wrap to prevent restarting bleeding. Return precautions given. Patient expresses understanding and agrees to plan.   Final Clinical Impressions(s) / UC Diagnoses   Final diagnoses:  Wound infection  Cellulitis of right upper extremity   ED Prescriptions    Medication Sig Dispense Auth. Provider   cephALEXin (KEFLEX) 500 MG capsule Take 1 capsule (500 mg total) by mouth 4 (four) times daily. 28 capsule Jamori Biggar V, PA-C   mupirocin ointment (BACTROBAN) 2 % Apply 1 application topically 2 (two) times daily. 22 g Ok Edwards, PA-C     PDMP not reviewed this encounter.   Ok Edwards, PA-C 06/17/19 1702

## 2019-06-17 NOTE — Discharge Instructions (Addendum)
Tetanus updated. Start keflex as directed. You can remove current dressing in 24 hours. Keep wound clean and dry. Clean gently with soap and water. Please change your dressing at least once a day. If wound looks moist (skin is white and wrinkled), please allow some times without dressing to let the wound "breath". Use acre wrap/coban to prevent any tape/adhesive on the skin due to current skin infection. Do not soak area in water. Monitor for spreading redness, increased warmth, increased swelling, fever, follow up for reevaluation needed.

## 2019-07-12 ENCOUNTER — Other Ambulatory Visit: Payer: Self-pay

## 2019-07-12 ENCOUNTER — Ambulatory Visit
Admission: EM | Admit: 2019-07-12 | Discharge: 2019-07-12 | Disposition: A | Discharge status: Home / Self Care | Payer: 59 | Attending: Emergency Medicine | Admitting: Emergency Medicine

## 2019-07-12 DIAGNOSIS — Z20822 Contact with and (suspected) exposure to covid-19: Secondary | ICD-10-CM

## 2019-07-12 DIAGNOSIS — M791 Myalgia, unspecified site: Secondary | ICD-10-CM

## 2019-07-12 DIAGNOSIS — G43109 Migraine with aura, not intractable, without status migrainosus: Secondary | ICD-10-CM

## 2019-07-12 LAB — POCT INFLUENZA A/B
Influenza A, POC: NEGATIVE
Influenza B, POC: NEGATIVE

## 2019-07-12 MED ORDER — DEXAMETHASONE SODIUM PHOSPHATE 10 MG/ML IJ SOLN
10.0000 mg | Freq: Once | INTRAMUSCULAR | Status: AC
  Administered 2019-07-12: 10 mg via INTRAMUSCULAR

## 2019-07-12 MED ORDER — KETOROLAC TROMETHAMINE 60 MG/2ML IM SOLN
60.0000 mg | Freq: Once | INTRAMUSCULAR | Status: AC
  Administered 2019-07-12: 60 mg via INTRAMUSCULAR

## 2019-07-12 MED ORDER — ONDANSETRON 4 MG PO TBDP
4.0000 mg | ORAL_TABLET | Freq: Once | ORAL | Status: AC
  Administered 2019-07-12: 4 mg via ORAL

## 2019-07-12 NOTE — ED Triage Notes (Addendum)
Patient reports having a headache and body aches upon waking this morning. She took dual action Advil/Tylenol at 0800 without relief.  She denies cold symptoms or known COVID exposure.  Denies nausea or vomiting, light or sound sensitivities.

## 2019-07-12 NOTE — ED Provider Notes (Signed)
[facility]    CSN: [id number] Arrival date & time: 07/12/19  1057      History   Chief Complaint Chief Complaint  Patient presents with  . Migraine    HPI Rebecca Rocha is a 19 y.o. female history of migraine, asthma presenting for migraine and myalgias upon waking this morning.  Patient states current symptoms are consistent with a severe migraine which she has not had "for years ".  Patient denies known sick exposure, fever, cough, shortness of breath, chest pain, palpitations, lightheadedness, dizziness, photophobia, photophobia, nausea, vomiting, abdominal pain.  No thunderclap headache, personal or family history of aneurysm.  Patient does endorse aura: Like a black spots prior to headache which is consistent for her migraines.  Tried taking Tylenol and Advil at 8:00 this morning without significant relief.  States his medications usually help.   Past Medical History:  Diagnosis Date  . Asthma   . Menstrual migraine, intractable, without status migrainosus   . Panic attack   . Sinusitis   . Syncope and collapse   . Vertigo of central origin, unspecified ear     Patient Active Problem List   Diagnosis Date Noted  . 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 Asthma 10/27/2016    History reviewed. No pertinent surgical history.  OB History    Gravida  0   Para  0   Term  0   Preterm  0   AB  0   Living  0     SAB  0   TAB  0   Ectopic  0   Multiple  0   Live Births  0            Home Medications    Prior to Admission medications   Not on File    Family History Family History  Problem Relation Age of Onset  . Hypertension Mother   . Liver disease Mother   . Other Mother        GENETIC (INHERITED) DISORDER  . Hypertension Father   . Diabetes Father   . Cancer Paternal Grandmother   . Diabetes Paternal Grandmother   . Hypertension Paternal Grandmother   . High Cholesterol Paternal Grandmother   .  Cancer Paternal Grandfather   . Diabetes Paternal Grandfather   . Hypertension Paternal Grandfather   . High Cholesterol Paternal Grandfather     Social History Social History   Tobacco Use  . Smoking status: Never Smoker  . Smokeless tobacco: Never Used  Substance Use Topics  . Alcohol use: No  . Drug use: No     Allergies   Patient has no known allergies.   Review of Systems As per HPI  Physical Exam Triage Vital Signs ED Triage Vitals  Enc Vitals Group     BP      Pulse      Resp      Temp      Temp src      SpO2      Weight      Height      Head Circumference      Peak Flow      Pain Score      Pain Loc      Pain Edu?      Excl. in GC?    No data found.  Updated Vital Signs BP 132/79   Pulse 74   Temp 98.2 F (36.8 C) (Oral)   Resp 16  SpO2 98%   Visual Acuity Right Eye Distance:   Left Eye Distance:   Bilateral Distance:    Right Eye Near:   Left Eye Near:    Bilateral Near:     Physical Exam Constitutional:      General: She is not in acute distress.    Appearance: She is not toxic-appearing or diaphoretic.  HENT:     Head: Normocephalic and atraumatic.     Right Ear: Tympanic membrane, ear canal and external ear normal.     Left Ear: Tympanic membrane, ear canal and external ear normal.     Mouth/Throat:     Mouth: Mucous membranes are moist.     Pharynx: Oropharynx is clear.  Eyes:     General: No scleral icterus.    Extraocular Movements: Extraocular movements intact.     Conjunctiva/sclera: Conjunctivae normal.     Pupils: Pupils are equal, round, and reactive to light.  Cardiovascular:     Rate and Rhythm: Normal rate and regular rhythm.     Heart sounds: No murmur. No gallop.   Pulmonary:     Effort: Pulmonary effort is normal. No respiratory distress.     Breath sounds: No wheezing or rales.  Musculoskeletal:        General: No deformity. Normal range of motion.     Cervical back: Normal range of motion. No rigidity  or tenderness.  Lymphadenopathy:     Cervical: No cervical adenopathy.  Skin:    General: Skin is warm.     Capillary Refill: Capillary refill takes less than 2 seconds.     Coloration: Skin is not jaundiced.     Findings: No bruising or rash.  Neurological:     Mental Status: She is alert.     Cranial Nerves: Cranial nerves are intact.     Sensory: Sensation is intact.     Motor: Motor function is intact.     Coordination: Coordination is intact.     Gait: Gait is intact.  Psychiatric:        Mood and Affect: Mood normal.        Behavior: Behavior normal.      UC Treatments / Results  Labs (all labs ordered are listed, but only abnormal results are displayed) Labs Reviewed  POCT INFLUENZA A/B - Normal  NOVEL CORONAVIRUS, NAA    EKG   Radiology No results found.  Procedures Procedures (including critical care time)  Medications Ordered in UC Medications  ketorolac (TORADOL) injection 60 mg (60 mg Intramuscular Given 07/12/19 1145)  dexamethasone (DECADRON) injection 10 mg (10 mg Intramuscular Given 07/12/19 1145)  ondansetron (ZOFRAN-ODT) disintegrating tablet 4 mg (4 mg Oral Given 07/12/19 1145)    Initial Impression / Assessment and Plan / UC Course  I have reviewed the triage vital signs and the nursing notes.  Pertinent labs & imaging results that were available during my care of the patient were reviewed by me and considered in my medical decision making (see chart for details).     Patient afebrile, nontoxic in office today.  No neurocognitive deficits on exam.  Rapid flu performed: Negative.  Covid PCR pending.  Patient given headache cocktail in office which he tolerated well.  Reporting improvement of migraine at time of discharge.  Will have patient monitor symptoms, treat supportively, and follow-up with PCP Monday.  ER return precautions discussed, patient verbalized understanding and is agreeable to plan. Final Clinical Impressions(s) / UC Diagnoses    Final diagnoses:  Myalgia  Migraine with aura and without status migrainosus, not intractable     Discharge Instructions     Flu test negative in office today. Covid test pending: Important to stay home until your test results are back. Given medications for migraine: Please monitor symptoms closely, keep a log and follow-up with your PCP Monday. Go to ER for worsening head pain, change in vision or hearing, numbness or tingling, difficulty speaking or breathing.    ED Prescriptions    None     PDMP not reviewed this encounter.   Hall[place]Potvin, [place], [place] 07/12/19 1158

## 2019-07-12 NOTE — Discharge Instructions (Signed)
Flu test negative in office today. Covid test pending: Important to stay home until your test results are back. Given medications for migraine: Please monitor symptoms closely, keep a log and follow-up with your PCP Monday. Go to ER for worsening head pain, change in vision or hearing, numbness or tingling, difficulty speaking or breathing.

## 2019-07-13 LAB — NOVEL CORONAVIRUS, NAA: SARS-CoV-2, NAA: NOT DETECTED

## 2019-08-18 ENCOUNTER — Encounter: Payer: 59 | Admitting: Obstetrics & Gynecology

## 2019-09-17 ENCOUNTER — Other Ambulatory Visit: Payer: Self-pay

## 2019-09-18 ENCOUNTER — Encounter: Payer: Self-pay | Admitting: Nurse Practitioner

## 2019-09-18 ENCOUNTER — Ambulatory Visit (INDEPENDENT_AMBULATORY_CARE_PROVIDER_SITE_OTHER): Payer: 59 | Admitting: Nurse Practitioner

## 2019-09-18 ENCOUNTER — Ambulatory Visit: Payer: 59 | Admitting: Nurse Practitioner

## 2019-09-18 VITALS — BP 110/60 | HR 75 | Temp 97.1°F | Ht 61.0 in | Wt 142.4 lb

## 2019-09-18 DIAGNOSIS — L738 Other specified follicular disorders: Secondary | ICD-10-CM | POA: Diagnosis not present

## 2019-09-18 DIAGNOSIS — N62 Hypertrophy of breast: Secondary | ICD-10-CM

## 2019-09-18 DIAGNOSIS — M549 Dorsalgia, unspecified: Secondary | ICD-10-CM

## 2019-09-18 DIAGNOSIS — G8929 Other chronic pain: Secondary | ICD-10-CM | POA: Insufficient documentation

## 2019-09-18 DIAGNOSIS — R569 Unspecified convulsions: Secondary | ICD-10-CM | POA: Diagnosis not present

## 2019-09-18 MED ORDER — MUPIROCIN 2 % EX OINT: 1.0000 "application " | TOPICAL_OINTMENT | Freq: Two times a day (BID) | CUTANEOUS | 0 refills | Status: AC

## 2019-09-18 MED ORDER — CEPHALEXIN 500 MG PO CAPS: 500.0000 mg | ORAL_CAPSULE | Freq: Three times a day (TID) | ORAL | 0 refills | Status: DC

## 2019-09-18 NOTE — Assessment & Plan Note (Signed)
Chronic, worsening, due to large breasts No improvement with changing bras size, use of back brace and posture exercise and completion PT 71months ago Minimal improvement with tylenol or NSAIDs Current bras size 32EE. Denies any UE muscle weakness or paresthesia or headaches BMI of 26 Wt Readings from Last 3 Encounters:  09/18/19 142 lb 6.4 oz (64.6 kg)  08/15/18 142 lb (64.4 kg) (71 %, Z= 0.57)*  11/20/17 138 lb (62.6 kg) (69 %, Z= 0.48)*   * Growth percentiles are based on CDC (Girls, 2-20 Years) data.   She is contemplating breast reduction, but has not found plastic surgery.

## 2019-09-18 NOTE — Assessment & Plan Note (Signed)
Reviewed records from neurology with [facility]: Dr. Malvin Johns, last OV 01/2018 MRI done 09/2017: normal EEG done 12/2017: normal  HEADACHES Patient states that she has been having headaches for 4 years. Headaches are constant. Located whole head. Describes pain as pressure, ache, and sharp. Patient notes that severity of headache episodes vary. Patient has taken Excedrin migraine in the past for symptoms. Taking fioricet as rescue medication with no relief. Positive for photophobia. No nausea or vomiting. States she has floaters and double vision.  DIZZINESS Patient states she has been having dizziness for 3-4 months. Occurs at random. Feels like lightheadedness and a sense of movement. Taking meclizine and that doesn't help. Patient notes that she has 2 or more episodes per week. Patient notes that she has appointment for cardiac monitor/echogram to evaluate heart.  Use of palmelor and effexor in past. Today she denies any reoccurrence of headache or dizziness

## 2019-09-18 NOTE — Progress Notes (Signed)
Subjective:  Patient ID: Rebecca Rocha, female    DOB: June 19, 2000  Age: 19 y.o. MRN: [id number]  CC: Establish Care (new pt-rash on stomach//back pain-pt stated upper back pain maybe because of new bra)  Back Pain This is a chronic problem. The current episode started more than 1 year ago. The problem occurs constantly. The problem has been gradually worsening since onset. The pain is present in the thoracic spine. The quality of the pain is described as aching. The pain does not radiate. The pain is the same all the time. Exacerbated by: increase in breast size. Pertinent negatives include no chest pain, fever, headaches, numbness, paresis, paresthesias, tingling or weight loss. Risk factors include poor posture. She has tried home exercises and NSAIDs for the symptoms. The treatment provided mild relief.  Rash This is a new problem. The current episode started in the past 7 days. The problem is unchanged. The affected locations include the abdomen. The rash is characterized by itchiness and redness. Associated with: shaving with blade. Pertinent negatives include no fatigue or fever. Past treatments include moisturizer. The treatment provided no relief.  changed bras size, completed PT 39months ago Current bras size 32EE She is contemplating breast reduction, but has not found plastic surgery.  Reviewed past Medical, Social and Family history today.  Outpatient Medications Prior to Visit  Medication Sig Dispense Refill  . ALBUTEROL SULFATE HFA IN     . meclizine (ANTIVERT) 25 MG tablet Take by mouth.    . nortriptyline (PAMELOR) 10 MG capsule Take by mouth.     No facility-administered medications prior to visit.    ROS See HPI  Objective:  BP 110/60   Pulse 75   Temp (!) 97.1 F (36.2 C) (Tympanic)   Ht 5\' 1"  (1.549 m)   Wt 142 lb 6.4 oz (64.6 kg)   SpO2 99%   BMI 26.91 kg/m   BP Readings from Last 3 Encounters:  09/18/19 110/60  07/12/19 132/79  06/17/19  114/73    Wt Readings from Last 3 Encounters:  09/18/19 142 lb 6.4 oz (64.6 kg)  08/15/18 142 lb (64.4 kg) (71 %, Z= 0.57)*  11/20/17 138 lb (62.6 kg) (69 %, Z= 0.48)*   * Growth percentiles are based on CDC (Girls, 2-20 Years) data.   Physical Exam Vitals reviewed.  Neck:     Thyroid: No thyroid mass, thyromegaly or thyroid tenderness.  Cardiovascular:     Rate and Rhythm: Normal rate and regular rhythm.     Pulses: Normal pulses.     Heart sounds: Normal heart sounds.  Pulmonary:     Effort: Pulmonary effort is normal.     Breath sounds: Normal breath sounds.  Musculoskeletal:        General: Tenderness present. No swelling or deformity. Normal range of motion.     Right shoulder: Normal.     Left shoulder: Normal.     Cervical back: Normal.     Thoracic back: Tenderness present. No deformity, spasms or bony tenderness. Normal range of motion. No scoliosis.     Lumbar back: Normal.       Back:     Right lower leg: No edema.     Left lower leg: No edema.     Comments: No indentation or hyperpigmentation noted on shoulders  Lymphadenopathy:     Cervical: No cervical adenopathy.  Skin:    General: Skin is warm and dry.     Findings: Erythema and rash present.  Neurological:  Mental Status: She is alert and oriented to person, place, and time.  Psychiatric:        Mood and Affect: Mood normal.        Behavior: Behavior normal.        Thought Content: Thought content normal.     Lab Results  Component Value Date   WBC 10.9 (H) 10/04/2017   HGB 12.6 10/04/2017   HCT 40.3 10/04/2017   PLT 411 (H) 10/04/2017   GLUCOSE 88 10/04/2017   ALT 16 11/22/2014   AST 22 11/22/2014   NA 138 10/04/2017   K 3.6 10/04/2017   CL 105 10/04/2017   CREATININE 0.79 10/04/2017   BUN 8 10/04/2017   CO2 25 10/04/2017    No results found.  Assessment & Plan:  This visit occurred during the SARS-CoV-2 public health emergency.  Safety protocols were in place, including  screening questions prior to the visit, additional usage of staff PPE, and extensive cleaning of exam room while observing appropriate contact time as indicated for disinfecting solutions.   Antasia was seen today for establish care.  Diagnoses and all orders for this visit:  Gigantomastia  Folliculitis barbae -     Discontinue: cephALEXin (KEFLEX) 500 MG capsule; Take 1 capsule (500 mg total) by mouth 3 (three) times daily. -     mupirocin ointment (BACTROBAN) 2 %; Place 1 application into the nose 2 (two) times daily for 4 days. -     cephALEXin (KEFLEX) 500 MG capsule; Take 1 capsule (500 mg total) by mouth 3 (three) times daily.  Chronic upper back pain   I am having Toniyah Tolentino-Gonzalez start on mupirocin ointment. I am also having her maintain her ALBUTEROL SULFATE HFA IN, meclizine, nortriptyline, and cephALEXin.  Meds ordered this encounter  Medications  . DISCONTD: cephALEXin (KEFLEX) 500 MG capsule    Sig: Take 1 capsule (500 mg total) by mouth 3 (three) times daily.    Dispense:  21 capsule    Refill:  0    Order Specific Question:   Supervising Provider    Answer:   Ronnald Nian [[id number]]  . mupirocin ointment (BACTROBAN) 2 %    Sig: Place 1 application into the nose 2 (two) times daily for 4 days.    Dispense:  8 g    Refill:  0    Order Specific Question:   Supervising Provider    Answer:   Ronnald Nian [[id number]]  . cephALEXin (KEFLEX) 500 MG capsule    Sig: Take 1 capsule (500 mg total) by mouth 3 (three) times daily.    Dispense:  21 capsule    Refill:  0    Order Specific Question:   Supervising Provider    Answer:   Ronnald Nian [[id number]]    Problem List Items Addressed This Visit      Other   Chronic upper back pain   Relevant Medications   nortriptyline (PAMELOR) 10 MG capsule   Gigantomastia - Primary    Other Visit Diagnoses    Folliculitis barbae       Relevant Medications   mupirocin ointment (BACTROBAN) 2 %    cephALEXin (KEFLEX) 500 MG capsule      Follow-up: Return if symptoms worsen or fail to improve.  Wilfred Lacy, NP

## 2019-09-18 NOTE — Assessment & Plan Note (Signed)
Chronic, worsening, due to large breasts No improvement with changing bras size, use of back brace and posture exercise and completion PT 58months ago Minimal improvement with tylenol or NSAIDs Current bras size 32EE. Denies any UE muscle weakness or paresthesia or headaches BMI of 26 Wt Readings from Last 3 Encounters:  09/18/19 142 lb 6.4 oz (64.6 kg)  08/15/18 142 lb (64.4 kg) (71 %, Z= 0.57)*  11/20/17 138 lb (62.6 kg) (69 %, Z= 0.48)*   * Growth percentiles are based on CDC (Girls, 2-20 Years) data.

## 2019-09-18 NOTE — Patient Instructions (Addendum)
Thank you for choosing [facility] for your health needs.  Sign medical release to get records from previous pcp. Will also need records from previous PT sessions completed.  Continue use of tylenol or ibuprofen for back pain Use bras with wide strap to provide adequate support.  Folliculitis  Folliculitis is inflammation of the hair follicles. Folliculitis most commonly occurs on the scalp, thighs, legs, back, and buttocks. However, it can occur anywhere on the body. What are the causes? This condition may be caused by:  A bacterial infection (common).  A fungal infection.  A viral infection.  Contact with certain chemicals, especially oils and tars.  Shaving or waxing.  Greasy ointments or creams applied to the skin. Long-lasting folliculitis and folliculitis that keeps coming back may be caused by bacteria. This bacteria can live anywhere on your skin and is often found in the nostrils. What increases the risk? You are more likely to develop this condition if you have:  A weakened immune system.  Diabetes.  Obesity. What are the signs or symptoms? Symptoms of this condition include:  Redness.  Soreness.  Swelling.  Itching.  Small white or yellow, pus-filled, itchy spots (pustules) that appear over a reddened area. If there is an infection that goes deep into the follicle, these may develop into a boil (furuncle).  A group of closely packed boils (carbuncle). These tend to form in hairy, sweaty areas of the body. How is this diagnosed? This condition is diagnosed with a skin exam. To find what is causing the condition, your health care provider may take a sample of one of the pustules or boils for testing in a lab. How is this treated? This condition may be treated by:  Applying warm compresses to the affected areas.  Taking an antibiotic medicine or applying an antibiotic medicine to the skin.  Applying or bathing with an antiseptic  solution.  Taking an over-the-counter medicine to help with itching.  Having a procedure to drain any pustules or boils. This may be done if a pustule or boil contains a lot of pus or fluid.  Having laser hair removal. This may be done to treat long-lasting folliculitis. Follow these instructions at home: Managing pain and swelling   If directed, apply heat to the affected area as often as told by your health care provider. Use the heat source that your health care provider recommends, such as a moist heat pack or a heating pad. ? Place a towel between your skin and the heat source. ? Leave the heat on for 20-30 minutes. ? Remove the heat if your skin turns bright red. This is especially important if you are unable to feel pain, heat, or cold. You may have a greater risk of getting burned. General instructions  If you were prescribed an antibiotic medicine, take it or apply it as told by your health care provider. Do not stop using the antibiotic even if your condition improves.  Check the irritated area every day for signs of infection. Check for: ? Redness, swelling, or pain. ? Fluid or blood. ? Warmth. ? Pus or a bad smell.  Do not shave irritated skin.  Take over-the-counter and prescription medicines only as told by your health care provider.  Keep all follow-up visits as told by your health care provider. This is important. Get help right away if:  You have more redness, swelling, or pain in the affected area.  Red streaks are spreading from the affected area.  You  have a fever. Summary  Folliculitis is inflammation of the hair follicles. Folliculitis most commonly occurs on the scalp, thighs, legs, back, and buttocks.  This condition may be treated by taking an antibiotic medicine or applying an antibiotic medicine to the skin, and applying or bathing with an antiseptic solution.  If you were prescribed an antibiotic medicine, take it or apply it as told by your  health care provider. Do not stop using the antibiotic even if your condition improves.  Get help right away if you have new or worsening symptoms.  Keep all follow-up visits as told by your health care provider. This is important. This information is not intended to replace advice given to you by your health care provider. Make sure you discuss any questions you have with your health care provider. Document Revised: 12/08/2017 Document Reviewed: 12/08/2017 Elsevier Patient Education  2020 [employer].

## 2019-09-19 ENCOUNTER — Encounter: Payer: Self-pay | Admitting: Nurse Practitioner

## 2019-09-29 ENCOUNTER — Other Ambulatory Visit: Payer: Self-pay

## 2019-09-29 ENCOUNTER — Encounter: Payer: Self-pay | Admitting: Emergency Medicine

## 2019-09-29 ENCOUNTER — Ambulatory Visit
Admission: EM | Admit: 2019-09-29 | Discharge: 2019-09-29 | Disposition: A | Discharge status: Home / Self Care | Payer: 59 | Attending: Emergency Medicine | Admitting: Emergency Medicine

## 2019-09-29 DIAGNOSIS — Z3202 Encounter for pregnancy test, result negative: Secondary | ICD-10-CM

## 2019-09-29 LAB — POCT URINALYSIS DIP (MANUAL ENTRY)
Bilirubin, UA: NEGATIVE
Blood, UA: NEGATIVE
Glucose, UA: NEGATIVE mg/dL
Ketones, POC UA: NEGATIVE mg/dL
Leukocytes, UA: NEGATIVE
Nitrite, UA: NEGATIVE
Protein Ur, POC: NEGATIVE mg/dL
Spec Grav, UA: 1.02 (ref 1.010–1.025)
Urobilinogen, UA: 0.2 E.U./dL
pH, UA: 7 (ref 5.0–8.0)

## 2019-09-29 LAB — POCT URINE PREGNANCY: Preg Test, Ur: NEGATIVE

## 2019-09-29 NOTE — ED Triage Notes (Signed)
Se by provider

## 2019-09-29 NOTE — ED Provider Notes (Signed)
[facility]    CSN: [id number] Arrival date & time: 09/29/19  1555      History   Chief Complaint Chief Complaint  Patient presents with  . Possible Pregnancy    HPI Rebecca Rocha is a 19 y.o. female presenting for pregnancy test.  LMP 2 years ago: When Nexplanon was placed.  Currently sexually active with 1 female partner, not routinely using condoms.  Patient does endorse week long course of urinary frequency: Denying urgency, dysuria, hematuria.  Patient does have some lower abdominal cramping.  No change in bowel habit, history of pyelonephritis, renal calculi.  No fever.   Past Medical History:  Diagnosis Date  . Asthma   . Menstrual migraine, intractable, without status migrainosus   . Panic attack   . Sinusitis   . Syncope and collapse   . Vertigo of central origin, unspecified ear     Patient Active Problem List   Diagnosis Date Noted  . 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 Chronic upper back pain 09/18/2019  . Seizure-like activity (HCC) 01/08/2018  . 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 Asthma 10/27/2016    History reviewed. No pertinent surgical history.  OB History    Gravida  0   Para  0   Term  0   Preterm  0   AB  0   Living  0     SAB  0   TAB  0   Ectopic  0   Multiple  0   Live Births  0            Home Medications    Prior to Admission medications   Medication Sig Start Date End Date Taking? Authorizing Provider  ALBUTEROL SULFATE HFA IN  09/17/16   [provider]  cephALEXin (KEFLEX) 500 MG capsule Take 1 capsule (500 mg total) by mouth 3 (three) times daily. 09/18/19   Nche, Bonna Gains, NP  meclizine (ANTIVERT) 25 MG tablet Take by mouth.    [provider]  nortriptyline (PAMELOR) 10 MG capsule Take by mouth. 12/31/17   [provider]    Family History Family History  Problem Relation Age of Onset  . Hypertension Mother   . Liver disease Mother   . Other  Mother        GENETIC (INHERITED) DISORDER  . Cancer - Other Mother        breast mass-possible cancer?  Marland Kitchen Hypertension Father   . Diabetes Father   . Cancer Paternal Grandmother   . Diabetes Paternal Grandmother   . Hypertension Paternal Grandmother   . High Cholesterol Paternal Grandmother   . Cancer Paternal Grandfather   . Diabetes Paternal Grandfather   . Hypertension Paternal Grandfather   . High Cholesterol Paternal Grandfather   . Anxiety disorder Sister   . Cancer Maternal Aunt     Social History Social History   Tobacco Use  . Smoking status: Never Smoker  . Smokeless tobacco: Never Used  Substance Use Topics  . Alcohol use: No  . Drug use: No     Allergies   Patient has no known allergies.   Review of Systems As per HPI   Physical Exam Triage Vital Signs ED Triage Vitals  Enc Vitals Group     BP      Pulse      Resp      Temp      Temp src      SpO2  Weight      Height      Head Circumference      Peak Flow      Pain Score      Pain Loc      Pain Edu?      Excl. in [place]?    No data found.  Updated Vital Signs BP 105/69 (BP Location: Left Arm)   Pulse 70   Temp 98.5 F (36.9 C) (Oral)   Resp 18   SpO2 98%   Visual Acuity Right Eye Distance:   Left Eye Distance:   Bilateral Distance:    Right Eye Near:   Left Eye Near:    Bilateral Near:     Physical Exam Constitutional:      General: She is not in acute distress. HENT:     Head: Normocephalic and atraumatic.  Eyes:     General: No scleral icterus.    Pupils: Pupils are equal, round, and reactive to light.  Cardiovascular:     Rate and Rhythm: Normal rate.  Pulmonary:     Effort: Pulmonary effort is normal.  Abdominal:     General: Bowel sounds are normal.     Palpations: Abdomen is soft.     Tenderness: There is no abdominal tenderness. There is no right CVA tenderness, left CVA tenderness or guarding.  Skin:    Coloration: Skin is not jaundiced or pale.    Neurological:     Mental Status: She is alert and oriented to person, place, and time.      UC Treatments / Results  Labs (all labs ordered are listed, but only abnormal results are displayed) Labs Reviewed  POCT URINE PREGNANCY - Normal  POCT URINALYSIS DIP (MANUAL ENTRY) - Normal    EKG   Radiology No results found.  Procedures Procedures (including critical care time)  Medications Ordered in UC Medications - No data to display  Initial Impression / Assessment and Plan / UC Course  I have reviewed the triage vital signs and the nursing notes.  Pertinent labs & imaging results that were available during my care of the patient were reviewed by me and considered in my medical decision making (see chart for details).     Patient appears well in office today.  Urine pregnancy negative.  Urine dipstick unremarkable.  Patient declined STI testing at this time.  Return precautions discussed, patient verbalized understanding and is agreeable to plan. Final Clinical Impressions(s) / UC Diagnoses   Final diagnoses:  Encounter for pregnancy test, result negative     Discharge Instructions     Pregnancy test today was negative. Urine does not show sign of infection. Recommend following up with your PCP in a few weeks for repeat testing if needed. Go to ER for severe lower abdominal or pelvic pain.    ED Prescriptions    None     PDMP not reviewed this encounter.   Hall-Potvin, [place], [place] 09/29/19 1648

## 2019-09-29 NOTE — Discharge Instructions (Signed)
Pregnancy test today was negative. Urine does not show sign of infection. Recommend following up with your PCP in a few weeks for repeat testing if needed. Go to ER for severe lower abdominal or pelvic pain.

## 2019-10-08 ENCOUNTER — Other Ambulatory Visit: Payer: Self-pay

## 2019-10-09 ENCOUNTER — Ambulatory Visit (INDEPENDENT_AMBULATORY_CARE_PROVIDER_SITE_OTHER): Payer: No Typology Code available for payment source | Admitting: Obstetrics & Gynecology

## 2019-10-09 ENCOUNTER — Encounter: Payer: Self-pay | Admitting: Obstetrics & Gynecology

## 2019-10-09 VITALS — BP 116/78 | Ht 60.75 in | Wt 141.0 lb

## 2019-10-09 DIAGNOSIS — Z3046 Encounter for surveillance of implantable subdermal contraceptive: Secondary | ICD-10-CM | POA: Diagnosis not present

## 2019-10-09 DIAGNOSIS — Z01419 Encounter for gynecological examination (general) (routine) without abnormal findings: Secondary | ICD-10-CM | POA: Diagnosis not present

## 2019-10-09 NOTE — Addendum Note (Signed)
Addended by: Berna Spare A on: 10/09/2019 09:08 AM   Modules accepted: Orders

## 2019-10-09 NOTE — Progress Notes (Signed)
    Rebecca Rocha November 09, 2000 [id number]   History:    19 y.o. G0 Single.  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  RP: Established patient presenting for annual gyn exam   HPI: Well on Nexplanon x 08/2018.  No breakthrough bleeding. Did a HPT for nausea which was a false positive as the sPT was negative the next day.  No pelvic pain.  No pain with intercourse. Breast normal.  Urine and bowel movements normal.  Body mass index 26.86.  Physically active.   Past medical history,surgical history, family history and social history were all reviewed and documented in the EPIC chart.  Gynecologic History No LMP recorded. Patient has had an implant.  Obstetric History OB History  Gravida Para Term Preterm AB Living  0 0 0 0 0 0  SAB TAB Ectopic Multiple Live Births  0 0 0 0 0     ROS: A ROS was performed and pertinent positives and negatives are included in the history.  GENERAL: No fevers or chills. HEENT: No change in vision, no earache, sore throat or sinus congestion. NECK: No pain or stiffness. CARDIOVASCULAR: No chest pain or pressure. No palpitations. PULMONARY: No shortness of breath, cough or wheeze. GASTROINTESTINAL: No abdominal pain, nausea, vomiting or diarrhea, melena or bright red blood per rectum. GENITOURINARY: No urinary frequency, urgency, hesitancy or dysuria. MUSCULOSKELETAL: No joint or muscle pain, no back pain, no recent trauma. DERMATOLOGIC: No rash, no itching, no lesions. ENDOCRINE: No polyuria, polydipsia, no heat or cold intolerance. No recent change in weight. HEMATOLOGICAL: No anemia or easy bruising or bleeding. NEUROLOGIC: No headache, seizures, numbness, tingling or weakness. PSYCHIATRIC: No depression, no loss of interest in normal activity or change in sleep pattern.     Exam:   BP 116/78   Ht 5' 0.75" (1.543 m)   Wt 141 lb (64 kg)   BMI 26.86 kg/m   Body mass index is 26.86 kg/m.  General appearance : Well developed  well nourished female. No acute distress HEENT: Eyes: no retinal hemorrhage or exudates,  Neck supple, trachea midline, no carotid bruits, no thyroidmegaly Lungs: Clear to auscultation, no rhonchi or wheezes, or rib retractions  Heart: Regular rate and rhythm, no murmurs or gallops Breast:Examined in sitting and supine position were symmetrical in appearance, no palpable masses or tenderness,  no skin retraction, no nipple inversion, no nipple discharge, no skin discoloration, no axillary or supraclavicular lymphadenopathy Abdomen: no palpable masses or tenderness, no rebound or guarding Extremities: no edema or skin discoloration or tenderness.  Nexplanon in good location at the left arm.  Pelvic: Vulva: Normal             Vagina: No gross lesions or discharge  Cervix: No gross lesions or discharge.  Pap reflex done.  Uterus  AV, normal size, shape and consistency, non-tender and mobile  Adnexa  Without masses or tenderness  Anus: Normal   Assessment/Plan:  19 y.o. female for annual exam   1. Encounter for routine gynecological examination with Papanicolaou smear of cervix Normal gynecologic exam.  Pap reflex done.  Breast exam normal.  Body mass index improved time last year at 26.86.  Continue with fitness and healthy nutrition.  Health labs with family physician.  2. Encounter for surveillance of implantable subdermal contraceptive Well on Nexplanon since April 2020.  [name] MD, 8:38 AM 10/09/2019

## 2019-10-09 NOTE — Patient Instructions (Signed)
1. Encounter for routine gynecological examination with Papanicolaou smear of cervix Normal gynecologic exam.  Pap reflex done.  Breast exam normal.  Body mass index improved time last year at 26.86.  Continue with fitness and healthy nutrition.  Health labs with family physician.  2. Encounter for surveillance of implantable subdermal contraceptive Well on Nexplanon since April 2020.  Rebecca Rocha, it was a pleasure seeing you today!  I will inform you of your results as soon as they are available.

## 2019-10-14 LAB — HM PAP SMEAR

## 2019-10-14 LAB — PAP IG W/ RFLX HPV ASCU

## 2019-10-29 ENCOUNTER — Ambulatory Visit
Admission: EM | Admit: 2019-10-29 | Discharge: 2019-10-29 | Disposition: A | Discharge status: Home / Self Care | Payer: No Typology Code available for payment source | Attending: Physician Assistant | Admitting: Physician Assistant

## 2019-10-29 DIAGNOSIS — Z5189 Encounter for other specified aftercare: Secondary | ICD-10-CM | POA: Diagnosis not present

## 2019-10-29 DIAGNOSIS — L559 Sunburn, unspecified: Secondary | ICD-10-CM

## 2019-10-29 MED ORDER — SILVER SULFADIAZINE 1 % EX CREA: 1.0000 "application " | TOPICAL_CREAM | Freq: Every day | CUTANEOUS | 0 refills | Status: DC

## 2019-10-29 NOTE — ED Provider Notes (Signed)
[facility]    CSN: [id number] Arrival date & time: 10/29/19  1526      History   Chief Complaint Chief Complaint  Patient presents with  . Sunburn    HPI Rebecca Rocha is a 19 y.o. female.   19 year old female comes in for facial wound/blisters after sunburn 2 days ago. States started having blistering yesterday, and due to work, was wearing a mask, and mask stuck to the face, erupting the blisters. Denies purulent drainage. Denies spreading erythema, warmth. Has been using topical medications without relief.      Past Medical History:  Diagnosis Date  . Asthma   . Menstrual migraine, intractable, without status migrainosus   . Panic attack   . Sinusitis   . Syncope and collapse   . Vertigo of central origin, unspecified ear     Patient Active Problem List   Diagnosis Date Noted  . 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 Chronic upper back pain 09/18/2019  . Seizure-like activity (HCC) 01/08/2018  . 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 Asthma 10/27/2016    History reviewed. No pertinent surgical history.  OB History    Gravida  0   Para  0   Term  0   Preterm  0   AB  0   Living  0     SAB  0   TAB  0   Ectopic  0   Multiple  0   Live Births  0            Home Medications    Prior to Admission medications   Medication Sig Start Date End Date Taking? Authorizing Provider  silver sulfADIAZINE (SILVADENE) 1 % cream Apply 1 application topically daily. 10/29/19   Belinda Fisher, PA-C    Family History Family History  Problem Relation Age of Onset  . Hypertension Mother   . Liver disease Mother   . Other Mother        GENETIC (INHERITED) DISORDER  . Cancer - Other Mother        breast mass-possible cancer?  Marland Kitchen Hypertension Father   . Diabetes Father   . Cancer Paternal Grandmother   . Diabetes Paternal Grandmother   . Hypertension Paternal Grandmother   . High Cholesterol Paternal Grandmother   . Cancer  Paternal Grandfather   . Diabetes Paternal Grandfather   . Hypertension Paternal Grandfather   . High Cholesterol Paternal Grandfather   . Anxiety disorder Sister   . Cancer Maternal Aunt     Social History Social History   Tobacco Use  . Smoking status: Never Smoker  . Smokeless tobacco: Never Used  Vaping Use  . Vaping Use: Never used  Substance Use Topics  . Alcohol use: Yes    Comment: RARE  . Drug use: No     Allergies   Patient has no known allergies.   Review of Systems Review of Systems  Reason unable to perform ROS: See HPI as above.     Physical Exam Triage Vital Signs ED Triage Vitals  Enc Vitals Group     BP 10/29/19 1546 129/82     Pulse Rate 10/29/19 1546 75     Resp 10/29/19 1546 18     Temp 10/29/19 1546 98.4 F (36.9 C)     Temp Source 10/29/19 1546 Oral     SpO2 10/29/19 1546 100 %     Weight --      Height --  Head Circumference --      Peak Flow --      Pain Score 10/29/19 1600 10     Pain Loc --      Pain Edu? --      Excl. in [place]? --    No data found.  Updated Vital Signs BP 129/82 (BP Location: Right Arm)   Pulse 75   Temp 98.4 F (36.9 C) (Oral)   Resp 18   SpO2 100%   Physical Exam Constitutional:      General: She is not in acute distress.    Appearance: Normal appearance. She is well-developed. She is not toxic-appearing or diaphoretic.  HENT:     Head: Normocephalic and atraumatic.  Eyes:     Conjunctiva/sclera: Conjunctivae normal.     Pupils: Pupils are equal, round, and reactive to light.  Pulmonary:     Effort: Pulmonary effort is normal. No respiratory distress.     Comments: Speaking in full sentences without difficulty Musculoskeletal:     Cervical back: Normal range of motion and neck supple.  Skin:    General: Skin is warm and dry.     Comments: Diffuse vesicular/maculopapular rash along bilateral chin, right forehead. Most vesicles have erupted without current drainage. Amedeo Plenty is clean and dry.  Surrounding erythema without warmth, purulent drainage, induration, fluctuance.   Neurological:     Mental Status: She is alert and oriented to person, place, and time.      UC Treatments / Results  Labs (all labs ordered are listed, but only abnormal results are displayed) Labs Reviewed - No data to display  EKG   Radiology No results found.  Procedures Procedures (including critical care time)  Medications Ordered in UC Medications - No data to display  Initial Impression / Assessment and Plan / UC Course  I have reviewed the triage vital signs and the nursing notes.  Pertinent labs & imaging results that were available during my care of the patient were reviewed by me and considered in my medical decision making (see chart for details).    No signs of infection at this time. Wound care instructions given. Return precautions given. Patient expresses understanding and agrees to plan.  Final Clinical Impressions(s) / UC Diagnoses   Final diagnoses:  Visit for wound check  Sunburn   ED Prescriptions    Medication Sig Dispense Auth. Provider   silver sulfADIAZINE (SILVADENE) 1 % cream Apply 1 application topically daily. 50 g Ok Edwards, PA-C     PDMP not reviewed this encounter.   Ok Edwards, PA-C 10/29/19 1620

## 2019-10-29 NOTE — ED Triage Notes (Signed)
Pt states got sunburn to face on Monday. States face blistered up last night and today after wearing her mask for 8hrs her mask was stuck to her face. Redness and blisters noted to cheek areas. States had a fever of 101.5 last night.

## 2019-10-29 NOTE — Discharge Instructions (Signed)
No signs of infection. Keep area clean and dry. Avoid heat/hot water. Dress with silvadene as directed. Monitor for spreading redness, warmth, swelling, pus like drainage, follow up for reevaluation.

## 2019-11-24 ENCOUNTER — Other Ambulatory Visit: Payer: Self-pay

## 2019-11-24 ENCOUNTER — Ambulatory Visit
Admission: RE | Admit: 2019-11-24 | Discharge: 2019-11-24 | Disposition: A | Discharge status: Home / Self Care | Payer: No Typology Code available for payment source | Source: Ambulatory Visit | Attending: Emergency Medicine | Admitting: Emergency Medicine

## 2019-11-24 VITALS — BP 127/80 | HR 66 | Temp 98.2°F | Resp 16

## 2019-11-24 DIAGNOSIS — R1084 Generalized abdominal pain: Secondary | ICD-10-CM

## 2019-11-24 DIAGNOSIS — Z3202 Encounter for pregnancy test, result negative: Secondary | ICD-10-CM | POA: Diagnosis not present

## 2019-11-24 DIAGNOSIS — G43109 Migraine with aura, not intractable, without status migrainosus: Secondary | ICD-10-CM

## 2019-11-24 LAB — POCT URINALYSIS DIP (MANUAL ENTRY)
Bilirubin, UA: NEGATIVE
Blood, UA: NEGATIVE
Glucose, UA: NEGATIVE mg/dL
Ketones, POC UA: NEGATIVE mg/dL
Leukocytes, UA: NEGATIVE
Nitrite, UA: NEGATIVE
Protein Ur, POC: NEGATIVE mg/dL
Spec Grav, UA: 1.025 (ref 1.010–1.025)
Urobilinogen, UA: 0.2 E.U./dL
pH, UA: 5.5 (ref 5.0–8.0)

## 2019-11-24 LAB — POCT URINE PREGNANCY: Preg Test, Ur: NEGATIVE

## 2019-11-24 MED ORDER — ONDANSETRON 4 MG PO TBDP
4.0000 mg | ORAL_TABLET | Freq: Once | ORAL | Status: AC
  Administered 2019-11-24: 4 mg via ORAL

## 2019-11-24 MED ORDER — KETOROLAC TROMETHAMINE 30 MG/ML IJ SOLN
30.0000 mg | Freq: Once | INTRAMUSCULAR | Status: AC
  Administered 2019-11-24: 30 mg via INTRAMUSCULAR

## 2019-11-24 MED ORDER — DEXAMETHASONE SODIUM PHOSPHATE 10 MG/ML IJ SOLN
10.0000 mg | Freq: Once | INTRAMUSCULAR | Status: AC
  Administered 2019-11-24: 10 mg via INTRAMUSCULAR

## 2019-11-24 NOTE — ED Triage Notes (Signed)
Pt presents to [facility] for assessment of abdominal pain since Friday night, generalized in nature, cramping.  Patient states she has had some urinary frequency, and also looser stools.  States her symptoms have been giving her migraines with black spots and aura.  Patient states she has not been able to go to work due to the symptoms.

## 2019-11-24 NOTE — ED Provider Notes (Signed)
[facility]    CSN: [id number] Arrival date & time: 11/24/19  0847      History   Chief Complaint Chief Complaint  Patient presents with  . Abdominal Pain  . Headache    HPI Rebecca Rocha is a 19 y.o. female with history of migraines, asthma presenting for generalized abdominal pain since Friday night.  Patient states it is not localized.  Feels cramping, nonradiating.  Did have single episode of emesis on Friday, none since.  Patient denying nausea, trauma, change in diet or medications.  Last bowel movement this morning: Looser than normal without blood or melena.  No fever, arthralgias, myalgias.  Patient states that she is also developed a migraine since.  Does endorse aura: Black spots.  No visual changes, hearing changes, worst headache of life, thunderclap headache, numbness or weakness.  Patient denies chest pain, difficulty breathing.   Past Medical History:  Diagnosis Date  . Asthma   . Menstrual migraine, intractable, without status migrainosus   . Panic attack   . Sinusitis   . Syncope and collapse   . Vertigo of central origin, unspecified ear     Patient Active Problem List   Diagnosis Date Noted  . 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 Chronic upper back pain 09/18/2019  . Seizure-like activity (HCC) 01/08/2018  . 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 Asthma 10/27/2016    History reviewed. No pertinent surgical history.  OB History    Gravida  0   Para  0   Term  0   Preterm  0   AB  0   Living  0     SAB  0   TAB  0   Ectopic  0   Multiple  0   Live Births  0            Home Medications    Prior to Admission medications   Medication Sig Start Date End Date Taking? Authorizing Provider  albuterol (VENTOLIN HFA) 108 (90 Base) MCG/ACT inhaler Inhale into the lungs every 6 (six) hours as needed for wheezing or shortness of breath.   Yes [provider]    Family History Family History    Problem Relation Age of Onset  . Hypertension Mother   . Liver disease Mother   . Other Mother        GENETIC (INHERITED) DISORDER  . Cancer - Other Mother        breast mass-possible cancer?  Marland Kitchen Hypertension Father   . Diabetes Father   . Cancer Paternal Grandmother   . Diabetes Paternal Grandmother   . Hypertension Paternal Grandmother   . High Cholesterol Paternal Grandmother   . Cancer Paternal Grandfather   . Diabetes Paternal Grandfather   . Hypertension Paternal Grandfather   . High Cholesterol Paternal Grandfather   . Anxiety disorder Sister   . Cancer Maternal Aunt     Social History Social History   Tobacco Use  . Smoking status: Never Smoker  . Smokeless tobacco: Never Used  Vaping Use  . Vaping Use: Never used  Substance Use Topics  . Alcohol use: Yes    Comment: RARE  . Drug use: No     Allergies   Patient has no known allergies.   Review of Systems As per HPI   Physical Exam Triage Vital Signs ED Triage Vitals  Enc Vitals Group     BP      Pulse      Resp  Temp      Temp src      SpO2      Weight      Height      Head Circumference      Peak Flow      Pain Score      Pain Loc      Pain Edu?      Excl. in [place]?    No data found.  Updated Vital Signs BP 127/80 (BP Location: Right Arm)   Pulse 66   Temp 98.2 F (36.8 C) (Oral)   Resp 16   SpO2 100%   Visual Acuity Right Eye Distance:   Left Eye Distance:   Bilateral Distance:    Right Eye Near:   Left Eye Near:    Bilateral Near:     Physical Exam Constitutional:      General: She is not in acute distress.    Appearance: She is not ill-appearing.     Comments: Sitting calmly  HENT:     Head: Normocephalic and atraumatic.     Right Ear: Tympanic membrane, ear canal and external ear normal.     Left Ear: Tympanic membrane, ear canal and external ear normal.     Mouth/Throat:     Mouth: Mucous membranes are moist.     Pharynx: Oropharynx is clear.  Eyes:      General: No scleral icterus.    Extraocular Movements: Extraocular movements intact.     Conjunctiva/sclera: Conjunctivae normal.     Pupils: Pupils are equal, round, and reactive to light.  Cardiovascular:     Rate and Rhythm: Normal rate.  Pulmonary:     Effort: Pulmonary effort is normal. No respiratory distress.     Breath sounds: No wheezing.  Abdominal:     General: Bowel sounds are normal. There is no distension. There are no signs of injury.     Palpations: Abdomen is soft. There is no hepatomegaly, splenomegaly or pulsatile mass.     Tenderness: There is generalized abdominal tenderness. There is no right CVA tenderness, left CVA tenderness or guarding. Negative signs include Murphy's sign, Rovsing's sign and McBurney's sign.     Hernia: No hernia is present.     Comments: mild  Musculoskeletal:        General: No deformity. Normal range of motion.     Cervical back: Normal range of motion. No rigidity or tenderness.  Lymphadenopathy:     Cervical: No cervical adenopathy.  Skin:    Capillary Refill: Capillary refill takes less than 2 seconds.     Coloration: Skin is not jaundiced or pale.     Findings: No bruising or rash.  Neurological:     Mental Status: She is alert and oriented to person, place, and time.     Cranial Nerves: Cranial nerves are intact.     Sensory: Sensation is intact.     Motor: Motor function is intact.     Coordination: Coordination is intact.     Gait: Gait is intact.  Psychiatric:        Mood and Affect: Mood normal.        Behavior: Behavior normal.      UC Treatments / Results  Labs (all labs ordered are listed, but only abnormal results are displayed) Labs Reviewed  POCT URINE PREGNANCY - Normal  POCT URINALYSIS DIP (MANUAL ENTRY) - Normal    EKG   Radiology No results found.  Procedures Procedures (including critical care time)  Medications Ordered in UC Medications  ketorolac (TORADOL) 30 MG/ML injection 30 mg (has no  administration in time range)  dexamethasone (DECADRON) injection 10 mg (has no administration in time range)  ondansetron (ZOFRAN-ODT) disintegrating tablet 4 mg (has no administration in time range)    Initial Impression / Assessment and Plan / UC Course  I have reviewed the triage vital signs and the nursing notes.  Pertinent labs & imaging results that were available during my care of the patient were reviewed by me and considered in my medical decision making (see chart for details).     Patient febrile, nontoxic in office today.  Urine dipstick and urine pregnancy both negative.  Provided headache cocktail as outlined above and will have patient monitor abdominal pain symptoms.  Patient has never had abdominopelvic surgery: Given reassuring exam and calm demeanor, low concern for acute process at this time.  Return precautions discussed, patient verbalized understanding and is agreeable to plan. Final Clinical Impressions(s) / UC Diagnoses   Final diagnoses:  Migraine with aura and without status migrainosus, not intractable  Generalized abdominal pain     Discharge Instructions     You were given medications today for your headache. Important to keep a log of your headaches: When they start, what alleviates them, pain on a scale of 1-10. Bring your headache log to your primary care for further evaluation as you may need to be on medications to help prevent headaches. Go to ER for worse headache of life, loss/change of vision, vomiting, fever, ear ringing, dizziness, weakness, facial droop/slurred speech, severe abdominal pain.    ED Prescriptions    None     PDMP not reviewed this encounter.   Hall-Potvin, [place], [place] 11/24/19 0945

## 2019-11-24 NOTE — Discharge Instructions (Addendum)
You were given medications today for your headache. Important to keep a log of your headaches: When they start, what alleviates them, pain on a scale of 1-10. Bring your headache log to your primary care for further evaluation as you may need to be on medications to help prevent headaches. Go to ER for worse headache of life, loss/change of vision, vomiting, fever, ear ringing, dizziness, weakness, facial droop/slurred speech, severe abdominal pain. 

## 2019-11-25 ENCOUNTER — Other Ambulatory Visit: Payer: Self-pay

## 2019-11-25 ENCOUNTER — Encounter (HOSPITAL_BASED_OUTPATIENT_CLINIC_OR_DEPARTMENT_OTHER): Payer: Self-pay | Admitting: *Deleted

## 2019-11-25 ENCOUNTER — Emergency Department (HOSPITAL_BASED_OUTPATIENT_CLINIC_OR_DEPARTMENT_OTHER): Payer: No Typology Code available for payment source

## 2019-11-25 ENCOUNTER — Observation Stay (HOSPITAL_BASED_OUTPATIENT_CLINIC_OR_DEPARTMENT_OTHER)
Admission: EM | Admit: 2019-11-25 | Discharge: 2019-11-27 | Disposition: A | Discharge status: Home / Self Care | Payer: No Typology Code available for payment source | Attending: Emergency Medicine | Admitting: Emergency Medicine

## 2019-11-25 ENCOUNTER — Emergency Department (HOSPITAL_COMMUNITY)
Admission: EM | Admit: 2019-11-25 | Discharge: 2019-11-25 | Disposition: A | Discharge status: Home / Self Care | Payer: No Typology Code available for payment source | Source: Home / Self Care

## 2019-11-25 ENCOUNTER — Encounter (HOSPITAL_COMMUNITY): Payer: Self-pay

## 2019-11-25 DIAGNOSIS — K8000 Calculus of gallbladder with acute cholecystitis without obstruction: Secondary | ICD-10-CM | POA: Diagnosis present

## 2019-11-25 DIAGNOSIS — G43839 Menstrual migraine, intractable, without status migrainosus: Secondary | ICD-10-CM | POA: Diagnosis not present

## 2019-11-25 DIAGNOSIS — K810 Acute cholecystitis: Principal | ICD-10-CM | POA: Diagnosis present

## 2019-11-25 DIAGNOSIS — R55 Syncope and collapse: Secondary | ICD-10-CM | POA: Insufficient documentation

## 2019-11-25 DIAGNOSIS — J45909 Unspecified asthma, uncomplicated: Secondary | ICD-10-CM | POA: Insufficient documentation

## 2019-11-25 DIAGNOSIS — Y999 Unspecified external cause status: Secondary | ICD-10-CM | POA: Insufficient documentation

## 2019-11-25 DIAGNOSIS — F410 Panic disorder [episodic paroxysmal anxiety] without agoraphobia: Secondary | ICD-10-CM | POA: Diagnosis not present

## 2019-11-25 DIAGNOSIS — W228XXA Striking against or struck by other objects, initial encounter: Secondary | ICD-10-CM | POA: Insufficient documentation

## 2019-11-25 DIAGNOSIS — N943 Premenstrual tension syndrome: Secondary | ICD-10-CM | POA: Insufficient documentation

## 2019-11-25 DIAGNOSIS — R569 Unspecified convulsions: Secondary | ICD-10-CM | POA: Insufficient documentation

## 2019-11-25 DIAGNOSIS — K819 Cholecystitis, unspecified: Secondary | ICD-10-CM | POA: Insufficient documentation

## 2019-11-25 DIAGNOSIS — G8929 Other chronic pain: Secondary | ICD-10-CM | POA: Insufficient documentation

## 2019-11-25 DIAGNOSIS — Y929 Unspecified place or not applicable: Secondary | ICD-10-CM | POA: Insufficient documentation

## 2019-11-25 DIAGNOSIS — R109 Unspecified abdominal pain: Secondary | ICD-10-CM | POA: Insufficient documentation

## 2019-11-25 DIAGNOSIS — R1030 Lower abdominal pain, unspecified: Secondary | ICD-10-CM | POA: Diagnosis present

## 2019-11-25 DIAGNOSIS — Z20822 Contact with and (suspected) exposure to covid-19: Secondary | ICD-10-CM | POA: Insufficient documentation

## 2019-11-25 DIAGNOSIS — Y939 Activity, unspecified: Secondary | ICD-10-CM | POA: Insufficient documentation

## 2019-11-25 DIAGNOSIS — M549 Dorsalgia, unspecified: Secondary | ICD-10-CM | POA: Insufficient documentation

## 2019-11-25 DIAGNOSIS — M546 Pain in thoracic spine: Secondary | ICD-10-CM | POA: Diagnosis not present

## 2019-11-25 DIAGNOSIS — R197 Diarrhea, unspecified: Secondary | ICD-10-CM | POA: Insufficient documentation

## 2019-11-25 DIAGNOSIS — Z5321 Procedure and treatment not carried out due to patient leaving prior to being seen by health care provider: Secondary | ICD-10-CM | POA: Insufficient documentation

## 2019-11-25 LAB — COMPREHENSIVE METABOLIC PANEL
ALT: 19 U/L (ref 0–44)
AST: 19 U/L (ref 15–41)
Albumin: 4.6 g/dL (ref 3.5–5.0)
Alkaline Phosphatase: 75 U/L (ref 38–126)
Anion gap: 12 (ref 5–15)
BUN: 12 mg/dL (ref 6–20)
CO2: 23 mmol/L (ref 22–32)
Calcium: 9.8 mg/dL (ref 8.9–10.3)
Chloride: 104 mmol/L (ref 98–111)
Creatinine, Ser: 0.73 mg/dL (ref 0.44–1.00)
GFR calc Af Amer: >60 mL/min (ref >60)
GFR calc non Af Amer: >60 mL/min (ref >60)
Glucose, Bld: 127 mg/dL — ABNORMAL HIGH (ref 70–99)
Potassium: 4.1 mmol/L (ref 3.5–5.1)
Sodium: 139 mmol/L (ref 135–145)
Total Bilirubin: 0.4 mg/dL (ref 0.3–1.2)
Total Protein: 8.3 g/dL — ABNORMAL HIGH (ref 6.5–8.1)

## 2019-11-25 LAB — LIPASE, BLOOD: Lipase: 23 U/L (ref 11–51)

## 2019-11-25 LAB — CBC
HCT: 38.5 % (ref 36.0–46.0)
Hemoglobin: 12.5 g/dL (ref 12.0–15.0)
MCH: 31.1 pg (ref 26.0–34.0)
MCHC: 32.5 g/dL (ref 30.0–36.0)
MCV: 95.8 fL (ref 80.0–100.0)
Platelets: 431 10*3/uL — ABNORMAL HIGH (ref 150–400)
RBC: 4.02 MIL/uL (ref 3.87–5.11)
RDW: 11.4 % — ABNORMAL LOW (ref 11.5–15.5)
WBC: 19.9 10*3/uL — ABNORMAL HIGH (ref 4.0–10.5)
nRBC: 0 % (ref 0.0–0.2)

## 2019-11-25 LAB — SARS CORONAVIRUS 2 BY RT PCR (HOSPITAL ORDER, PERFORMED IN ~~LOC~~ HOSPITAL LAB): SARS Coronavirus 2: NEGATIVE

## 2019-11-25 MED ORDER — SODIUM CHLORIDE 0.9 % IV BOLUS
1000.0000 mL | Freq: Once | INTRAVENOUS | Status: AC
  Administered 2019-11-25: 1000 mL via INTRAVENOUS

## 2019-11-25 MED ORDER — KETOROLAC TROMETHAMINE 30 MG/ML IJ SOLN
15.0000 mg | Freq: Once | INTRAMUSCULAR | Status: AC
  Administered 2019-11-25: 15 mg via INTRAVENOUS
  Filled 2019-11-25: qty 1

## 2019-11-25 MED ORDER — MORPHINE SULFATE (PF) 4 MG/ML IV SOLN
4.0000 mg | Freq: Once | INTRAVENOUS | Status: AC
  Administered 2019-11-25: 4 mg via INTRAVENOUS
  Filled 2019-11-25: qty 1

## 2019-11-25 MED ORDER — SODIUM CHLORIDE 0.9 % IV SOLN
2.0000 g | Freq: Once | INTRAVENOUS | Status: AC
  Administered 2019-11-25: 2 g via INTRAVENOUS
  Filled 2019-11-25: qty 20

## 2019-11-25 MED ORDER — IOHEXOL 300 MG/ML  SOLN
100.0000 mL | Freq: Once | INTRAMUSCULAR | Status: AC | PRN
  Administered 2019-11-25: 100 mL via INTRAVENOUS

## 2019-11-25 MED ORDER — ONDANSETRON HCL 4 MG/2ML IJ SOLN
4.0000 mg | Freq: Once | INTRAMUSCULAR | Status: AC
  Administered 2019-11-25: 4 mg via INTRAVENOUS
  Filled 2019-11-25: qty 2

## 2019-11-25 MED ORDER — SODIUM CHLORIDE 0.9% FLUSH: 3.0000 mL | Freq: Once | INTRAVENOUS | Status: DC

## 2019-11-25 NOTE — ED Provider Notes (Signed)
19 y/o F presenting from [facility] where she was seen for eval of abd pain. Labs, RUQ [place], and CT abd/pelvis completed which demonstrated acute cholecystitis.  Gen surg consulted and planned for ED to ED transfer and admission to surgery service on arrival.   7:13 PM CONSULT with Dr. Cliffton Asters with general surgery who accepts patient for admission.    Rayne Du 11/25/19 1913    Lorre Nick, MD 11/26/19 [phone]

## 2019-11-25 NOTE — ED Provider Notes (Signed)
[facility] EMERGENCY DEPARTMENT Provider Note   CSN: [id number] Arrival date & time: 11/25/19  1257     History Chief Complaint  Patient presents with  . Abdominal Pain    Rebecca Rocha is a 19 y.o. female presenting for evaluation of headache, fever, body aches, nausea, vomiting, abdominal pain, diarrhea.  Patient states her symptoms began 4 days ago.  It began with abdominal pain.  She then developed a headache and generalized body aches.  She then developed a fever, tmax 102. She states due to the abdominal pain, she got dizzy and fell, hit her head.  She did not lose consciousness.  She is not on blood thinners.  She has a history of migraines, states her headache feels similar to that.  She reports current nausea, however no vomiting since yesterday.  She reports frequent bowel movements, approximately 6 a day.  No blood in her stool.  She denies sick contacts.  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  She denies nasal congestion, sore throat, chest pain, shortness of breath.  Abdominal pain is periumbilical/epigastric.  Has been taking Pepto-Bismol and ibuprofen without improvement of symptoms.  She has not had a period for 2 years due to birth control implant.  She was seen yesterday urgent care, treated for migraine headache without improvement of symptoms.  He was at [facility] earlier today, but left without being seen.  Additional history obtained from chart review.  Reviewed urgent care note from yesterday as well as lab work obtained at [facility] today.  HPI     Past Medical History:  Diagnosis Date  . Asthma   . Menstrual migraine, intractable, without status migrainosus   . Panic attack   . Sinusitis   . Syncope and collapse   . Vertigo of central origin, unspecified ear     Patient Active Problem List   Diagnosis Date Noted  . 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 Chronic upper back pain 09/18/2019  .  Seizure-like activity (HCC) 01/08/2018  . 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 Asthma 10/27/2016    History reviewed. No pertinent surgical history.   OB History    Gravida  0   Para  0   Term  0   Preterm  0   AB  0   Living  0     SAB  0   TAB  0   Ectopic  0   Multiple  0   Live Births  0           Family History  Problem Relation Age of Onset  . Hypertension Mother   . Liver disease Mother   . Other Mother        GENETIC (INHERITED) DISORDER  . Cancer - Other Mother        breast mass-possible cancer?  Marland Kitchen Hypertension Father   . Diabetes Father   . Cancer Paternal Grandmother   . Diabetes Paternal Grandmother   . Hypertension Paternal Grandmother   . High Cholesterol Paternal Grandmother   . Cancer Paternal Grandfather   . Diabetes Paternal Grandfather   . Hypertension Paternal Grandfather   . High Cholesterol Paternal Grandfather   . Anxiety disorder Sister   . Cancer Maternal Aunt     Social History   Tobacco Use  . Smoking status: Never Smoker  . Smokeless tobacco: Never Used  Vaping Use  . Vaping Use: Never used  Substance Use Topics  .  Alcohol use: Yes    Comment: RARE  . Drug use: No    Home Medications Prior to Admission medications   Medication Sig Start Date End Date Taking? Authorizing Provider  albuterol (VENTOLIN HFA) 108 (90 Base) MCG/ACT inhaler Inhale into the lungs every 6 (six) hours as needed for wheezing or shortness of breath.    [provider]    Allergies    Patient has no known allergies.  Review of Systems   Review of Systems  Constitutional: Positive for fever.  Gastrointestinal: Positive for abdominal pain, diarrhea, nausea and vomiting.  Musculoskeletal: Positive for myalgias.  Neurological: Positive for headaches.  All other systems reviewed and are negative.   Physical Exam Updated Vital Signs BP 125/70   Pulse 84   Temp 98.2 F (36.8 C) (Oral)   Resp 16   Ht 5\' 1"   (1.549 m)   Wt 65.8 kg   SpO2 100%   BMI 27.41 kg/m   Physical Exam Vitals and nursing note reviewed.  Constitutional:      General: She is not in acute distress.    Appearance: She is well-developed.     Comments: Appears nontoxic  HENT:     Head: Normocephalic and atraumatic.     Comments: OP mildly erythematous.  TMs nonerythematous nonbulging bilaterally.  No nasal mucosal edema or congestion. Eyes:     Extraocular Movements: Extraocular movements intact.     Conjunctiva/sclera: Conjunctivae normal.     Pupils: Pupils are equal, round, and reactive to light.  Neck:     Comments: No neck stiffness or signs of meningismus Cardiovascular:     Rate and Rhythm: Normal rate and regular rhythm.     Pulses: Normal pulses.  Pulmonary:     Effort: Pulmonary effort is normal. No respiratory distress.     Breath sounds: Normal breath sounds. No wheezing.     Comments: Clear lung sounds in all fields Abdominal:     General: There is no distension.     Palpations: Abdomen is soft. There is no mass.     Tenderness: There is abdominal tenderness. There is no guarding or rebound.     Comments: ttp of epigastric abd left lower quadrant abdomen. No ttp elsewhere in the abd. rigidity, guarding, distention.  Negative rebound.  No signs of peritonitis.  Musculoskeletal:        General: Normal range of motion.     Cervical back: Normal range of motion and neck supple. No rigidity.  Skin:    General: Skin is warm and dry.     Capillary Refill: Capillary refill takes less than 2 seconds.  Neurological:     Mental Status: She is alert and oriented to person, place, and time.     ED Results / Procedures / Treatments   Labs (all labs ordered are listed, but only abnormal results are displayed) Labs Reviewed  SARS CORONAVIRUS 2 BY RT PCR (HOSPITAL ORDER[facility])    EKG None  Radiology CT ABDOMEN PELVIS W CONTRAST  Result Date: 11/25/2019 CLINICAL DATA:   Nausea and vomiting.  Back pain. EXAM: CT ABDOMEN AND PELVIS WITH CONTRAST TECHNIQUE: Multidetector CT imaging of the abdomen and pelvis was performed using the standard protocol following bolus administration of intravenous contrast. CONTRAST:  100mL OMNIPAQUE IOHEXOL 300 MG/ML  SOLN COMPARISON:  None. FINDINGS: Lower chest: Unremarkable Hepatobiliary: Mild gallbladder wall thickening. No biliary dilatation. Heterogeneous hepatic enhancement likely related to early contrast bolus, the hepatic  veins are not opacified. No focal liver lesion. Pancreas: Unremarkable Spleen: Heterogeneous enhancement attributed to arterial phase of contrast. Adrenals/Urinary Tract: Unremarkable Stomach/Bowel: Prominent stool throughout the colon favors constipation. The terminal ileum and appendix appear unremarkable. No dilated small bowel. Vascular/Lymphatic: Unremarkable Reproductive: Unremarkable Other: No supplemental non-categorized findings. Musculoskeletal: Unremarkable IMPRESSION: 1. Prominent stool throughout the colon favors constipation. 2. Mild gallbladder wall thickening, nonspecific, but cholecystitis is not excluded. Correlate with any localized tenderness in the right upper quadrant. 3. Electronically Signed   By: Gaylyn Rong M.D.   On: 11/25/2019 15:30   US Abdomen Limited RUQ  Result Date: 11/25/2019 CLINICAL DATA:  Abdominal pain for 5 days EXAM: ULTRASOUND ABDOMEN LIMITED RIGHT UPPER QUADRANT COMPARISON:  None. FINDINGS: Gallbladder: There is diffuse edematous hypervascular gallbladder wall thickening measuring up to 11.6 mm. There is a positive sonographic Murphy sign. However no definite layering echogenic stones are seen. Common bile duct: Diameter: 2.8 mm Liver: No focal lesion identified. Within normal limits in parenchymal echogenicity. Portal vein is patent on color Doppler imaging with normal direction of blood flow towards the liver. Other: None. IMPRESSION: Findings which could be suggestive  acute acalculous cholecystitis. Electronically Signed   By: Jonna Clark M.D.   On: 11/25/2019 16:43    Procedures Procedures (including critical care time)  Medications Ordered in ED Medications  cefTRIAXone (ROCEPHIN) 2 g in sodium chloride 0.9 % 100 mL IVPB (has no administration in time range)  sodium chloride 0.9 % bolus 1,000 mL (1,000 mLs Intravenous New Bag/Given 11/25/19 1427)  ondansetron (ZOFRAN) injection 4 mg (4 mg Intravenous Given 11/25/19 1428)  ketorolac (TORADOL) 30 MG/ML injection 15 mg (15 mg Intravenous Given 11/25/19 1428)  iohexol (OMNIPAQUE) 300 MG/ML solution 100 mL (100 mLs Intravenous Contrast Given 11/25/19 1501)  morphine 4 MG/ML injection 4 mg (4 mg Intravenous Given 11/25/19 1636)    ED Course  I have reviewed the triage vital signs and the nursing notes.  Pertinent labs & imaging results that were available during my care of the patient were reviewed by me and considered in my medical decision making (see chart for details).    MDM Rules/Calculators/A&P                          Patient presenting for evaluation of headache, fever, body aches, and abdominal symptoms.  On exam, patient appears nontoxic.  She does have epigastric and left lower quadrant abdominal pain.  Considering multisystem involvement, likely viral illness.  Consider Covid.  However also consider diverticulitis with left lower quadrant abdominal pain.  Less likely appendicitis or pancreatitis, especially in the setting of a normal lipase at [facility] earlier today.  Patient does have leukocytosis of 19, this may be due to her recent steroid shot.  However in the setting of fever and other infectious symptoms, will obtain CT abdomen to ensure no intra-abdominal infection.  She had negative pregnancy test yesterday, we will not repeat.  She is not having any cough or respiratory symptoms, will hold on respiratory imaging.  Will treat symptomatically with fluids, toradol, and Zofran and  reassess.  On reassessment, patient reports improvement of headache, however she still has both of abdominal pain.  Covid test is negative.  CT abdomen pelvis shows thickened gallbladder wall consistent with possible cholecystitis.  Will obtain ultrasound for further evaluation.  Additionally, CT does show moderate stool retention consistent with constipation.  Discussed findings with patient plan for ultrasound, she is agreeable.  Ultrasound consistent with a calculus cholecystitis.  Patient continues to have upper abdominal tenderness, in the setting of elevated white count and fever, she will likely need surgery.  Will consult with general surgery.  Discussed with Dr. Sheliah Hatch from general surgery who recommends transfer to [facility] ED.  Requests general surgery team be notified upon patient's arrival.  General surgery to admit. Case discussed with attending, Dr. Silverio Lay evaluated the pt.   Discussed with Dr. Rhunette Croft from the ED at [facility], who accepts pt for transfer.   Final Clinical Impression(s) / ED Diagnoses Final diagnoses:  Abdominal pain  Acalculous cholecystitis    Rx / DC Orders ED Discharge Orders    None       Alveria Apley, PA-C 11/25/19 1704    Charlynne Pander, MD 11/28/19 1555

## 2019-11-25 NOTE — H&P (Signed)
CC/Reason for consult: Acute cholecystitis  Requesting provider: Leonia Corona, PA-C  HPI: Rebecca Rocha is an 19 y.o. female with hx of panic attacks, vertigo, intractable menstrual migraines and syncope presented to ED first here then [facility] with RUQ pain that began 7/9. Also radiates to MEG. States pain has been unremitting.  The pain is described as sharp and severe.  She reports associated chills.  She also has had nausea.  She denies any changes in her bowel habits.  She reports that she has had intermittent symptoms in her right upper quadrant with regards to pains that have been going on for the last 5 months.  She first noticed these during her pregnancy.  This episode however has been the most severe.  PSH: Denies any prior abdominal or pelvic surgery  SHx: Denies tobacco; occasional EtOH use; denies drug use. She is a Software engineer at [employer].  Past Medical History:  Diagnosis Date  . Asthma   . Menstrual migraine, intractable, without status migrainosus   . Panic attack   . Sinusitis   . Syncope and collapse   . Vertigo of central origin, unspecified ear     History reviewed. No pertinent surgical history.  Family History  Problem Relation Age of Onset  . Hypertension Mother   . Liver disease Mother   . Other Mother        GENETIC (INHERITED) DISORDER  . Cancer - Other Mother        breast mass-possible cancer?  Marland Kitchen Hypertension Father   . Diabetes Father   . Cancer Paternal Grandmother   . Diabetes Paternal Grandmother   . Hypertension Paternal Grandmother   . High Cholesterol Paternal Grandmother   . Cancer Paternal Grandfather   . Diabetes Paternal Grandfather   . Hypertension Paternal Grandfather   . High Cholesterol Paternal Grandfather   . Anxiety disorder Sister   . Cancer Maternal Aunt     Social:  reports that she has never smoked. She has never used smokeless tobacco. She reports current alcohol use.  She reports that she does not use drugs.  Allergies: No Known Allergies  Medications: I have reviewed the patient's current medications.  Results for orders placed or performed during the hospital encounter of 11/25/19 (from the past 48 hour(s))  SARS Coronavirus 2 by RT PCR (hospital order, performed in [facility] hospital lab) Nasopharyngeal Nasopharyngeal Swab     Status: None   Collection Time: 11/25/19  2:32 PM   Specimen: Nasopharyngeal Swab  Result Value Ref Range   SARS Coronavirus 2 NEGATIVE NEGATIVE    Comment: (NOTE) SARS-CoV-2 target nucleic acids are NOT DETECTED.  The SARS-CoV-2 RNA is generally detectable in upper and lower respiratory specimens during the acute phase of infection. The lowest concentration of SARS-CoV-2 viral copies this assay can detect is 250 copies / mL. A negative result does not preclude SARS-CoV-2 infection and should not be used as the sole basis for treatment or other patient management decisions.  A negative result may occur with improper specimen collection / handling, submission of specimen other than nasopharyngeal swab, presence of viral mutation(s) within the areas targeted by this assay, and inadequate number of viral copies (<250 copies / mL). A negative result must be combined with clinical observations, patient history, and epidemiological information.  Fact Sheet for Patients:   BoilerBrush.com.cy  Fact Sheet for Healthcare Providers: https://pope.com/  This test is not yet approved or  cleared by the [place] FDA  and has been authorized for detection and/or diagnosis of SARS-CoV-2 by FDA under an Emergency Use Authorization (EUA).  This EUA will remain in effect (meaning this test can be used) for the duration of the COVID-19 declaration under Section 564(b)(1) of the Act, 21 U.S.C. section 360bbb-3(b)(1), unless the authorization is terminated or revoked  sooner.  Performed at [facility] [place], [address] [place], [place] [address]     CT ABDOMEN PELVIS W CONTRAST  Result Date: 11/25/2019 CLINICAL DATA:  Nausea and vomiting.  Back pain. EXAM: CT ABDOMEN AND PELVIS WITH CONTRAST TECHNIQUE: Multidetector CT imaging of the abdomen and pelvis was performed using the standard protocol following bolus administration of intravenous contrast. CONTRAST:  100mL OMNIPAQUE IOHEXOL 300 MG/ML  SOLN COMPARISON:  None. FINDINGS: Lower chest: Unremarkable Hepatobiliary: Mild gallbladder wall thickening. No biliary dilatation. Heterogeneous hepatic enhancement likely related to early contrast bolus, the hepatic veins are not opacified. No focal liver lesion. Pancreas: Unremarkable Spleen: Heterogeneous enhancement attributed to arterial phase of contrast. Adrenals/Urinary Tract: Unremarkable Stomach/Bowel: Prominent stool throughout the colon favors constipation. The terminal ileum and appendix appear unremarkable. No dilated small bowel. Vascular/Lymphatic: Unremarkable Reproductive: Unremarkable Other: No supplemental non-categorized findings. Musculoskeletal: Unremarkable IMPRESSION: 1. Prominent stool throughout the colon favors constipation. 2. Mild gallbladder wall thickening, nonspecific, but cholecystitis is not excluded. Correlate with any localized tenderness in the right upper quadrant. 3. Electronically Signed   By: Gaylyn RongWalter  Liebkemann M.D.   On: 11/25/2019 15:30   US Abdomen Limited RUQ  Result Date: 11/25/2019 CLINICAL DATA:  Abdominal pain for 5 days EXAM: ULTRASOUND ABDOMEN LIMITED RIGHT UPPER QUADRANT COMPARISON:  None. FINDINGS: Gallbladder: There is diffuse edematous hypervascular gallbladder wall thickening measuring up to 11.6 mm. There is a positive sonographic Murphy sign. However no definite layering echogenic stones are seen. Common bile duct: Diameter: 2.8 mm Liver: No focal lesion identified. Within normal limits in parenchymal  echogenicity. Portal vein is patent on color Doppler imaging with normal direction of blood flow towards the liver. Other: None. IMPRESSION: Findings which could be suggestive acute acalculous cholecystitis. Electronically Signed   By: Jonna ClarkBindu  Avutu M.D.   On: 11/25/2019 16:43    ROS - all of the below systems have been reviewed with the patient and positives are indicated with bold text General: chills, fever or night sweats Eyes: blurry vision or double vision ENT: epistaxis or sore throat Allergy/Immunology: itchy/watery eyes or nasal congestion Hematologic/Lymphatic: bleeding problems, blood clots or swollen lymph nodes Endocrine: temperature intolerance or unexpected weight changes Breast: new or changing breast lumps or nipple discharge Resp: cough, shortness of breath, or wheezing CV: chest pain or dyspnea on exertion GI: as per HPI GU: dysuria, trouble voiding, or hematuria MSK: joint pain or joint stiffness Neuro: TIA or stroke symptoms Derm: pruritus and skin lesion changes Psych: anxiety and depression  PE Blood pressure 132/73, pulse 68, temperature 98.3 F (36.8 C), temperature source Oral, resp. rate 18, height 5\' 1"  (1.549 m), weight 65.8 kg, SpO2 100 %. Constitutional: Uncomfortable, standing in room; conversant; no deformities Eyes: Moist conjunctiva; no lid lag; anicteric; pupils equal and round Neck: Trachea midline; no thyromegaly Lungs: Normal respiratory effort; no tactile fremitus CV: RRR; no palpable thrills; no pitting edema GI: Abd soft, focally ttp in RUQ; nondistended; no palpable hepatosplenomegaly; supraumbilical piercing. MSK: Normal range of motion of extremities; no clubbing/cyanosis. Tattoo L leg Psychiatric: Appropriate affect; alert and oriented x3 Lymphatic: No palpable cervical or axillary lymphadenopathy  Results for orders placed  or performed during the hospital encounter of 11/25/19 (from the past 48 hour(s))  SARS Coronavirus 2 by RT PCR  (hospital order, performed in [facility] hospital lab) Nasopharyngeal Nasopharyngeal Swab     Status: None   Collection Time: 11/25/19  2:32 PM   Specimen: Nasopharyngeal Swab  Result Value Ref Range   SARS Coronavirus 2 NEGATIVE NEGATIVE    Comment: (NOTE) SARS-CoV-2 target nucleic acids are NOT DETECTED.  The SARS-CoV-2 RNA is generally detectable in upper and lower respiratory specimens during the acute phase of infection. The lowest concentration of SARS-CoV-2 viral copies this assay can detect is 250 copies / mL. A negative result does not preclude SARS-CoV-2 infection and should not be used as the sole basis for treatment or other patient management decisions.  A negative result may occur with improper specimen collection / handling, submission of specimen other than nasopharyngeal swab, presence of viral mutation(s) within the areas targeted by this assay, and inadequate number of viral copies (<250 copies / mL). A negative result must be combined with clinical observations, patient history, and epidemiological information.  Fact Sheet for Patients:   BoilerBrush.com.cy  Fact Sheet for Healthcare Providers: https://pope.com/  This test is not yet approved or  cleared by the [place] FDA and has been authorized for detection and/or diagnosis of SARS-CoV-2 by FDA under an Emergency Use Authorization (EUA).  This EUA will remain in effect (meaning this test can be used) for the duration of the COVID-19 declaration under Section 564(b)(1) of the Act, 21 U.S.C. section 360bbb-3(b)(1), unless the authorization is terminated or revoked sooner.  Performed at [facility], [address] [place], [place] [address]     CT ABDOMEN PELVIS W CONTRAST  Result Date: 11/25/2019 CLINICAL DATA:  Nausea and vomiting.  Back pain. EXAM: CT ABDOMEN AND PELVIS WITH CONTRAST TECHNIQUE: Multidetector CT imaging of the abdomen and  pelvis was performed using the standard protocol following bolus administration of intravenous contrast. CONTRAST:  OMNIPAQUE IOHEXOL 300 MG/ML  SOLN COMPARISON:  None. FINDINGS: Lower chest: Unremarkable Hepatobiliary: Mild gallbladder wall thickening. No biliary dilatation. Heterogeneous hepatic enhancement likely related to early contrast bolus, the hepatic veins are not opacified. No focal liver lesion. Pancreas: Unremarkable Spleen: Heterogeneous enhancement attributed to arterial phase of contrast. Adrenals/Urinary Tract: Unremarkable Stomach/Bowel: Prominent stool throughout the colon favors constipation. The terminal ileum and appendix appear unremarkable. No dilated small bowel. Vascular/Lymphatic: Unremarkable Reproductive: Unremarkable Other: No supplemental non-categorized findings. Musculoskeletal: Unremarkable IMPRESSION: 1. Prominent stool throughout the colon favors constipation. 2. Mild gallbladder wall thickening, nonspecific, but cholecystitis is not excluded. Correlate with any localized tenderness in the right upper quadrant. 3. Electronically Signed   By: Gaylyn Rong M.D.   On: 11/25/2019 15:30   US Abdomen Limited RUQ  Result Date: 11/25/2019 CLINICAL DATA:  Abdominal pain for 5 days EXAM: ULTRASOUND ABDOMEN LIMITED RIGHT UPPER QUADRANT COMPARISON:  None. FINDINGS: Gallbladder: There is diffuse edematous hypervascular gallbladder wall thickening measuring up to 11.6 mm. There is a positive sonographic Murphy sign. However no definite layering echogenic stones are seen. Common bile duct: Diameter: 2.8 mm Liver: No focal lesion identified. Within normal limits in parenchymal echogenicity. Portal vein is patent on color Doppler imaging with normal direction of blood flow towards the liver. Other: None. IMPRESSION: Findings which could be suggestive acute acalculous cholecystitis. Electronically Signed   By: Jonna Clark M.D.   On: 11/25/2019 16:43     A/P: Rebecca  Rocha is an 19  y.o. female with acute cholecystitis  -Admit to surgery -NPO after midnight -MIVF -IV Abx -The anatomy and physiology of the hepatobiliary system was discussed at length with the patient. The pathophysiology of gallbladder disease was discussed at length as well. -The options for treatment were discussed including antibiotics, percutaneous drains and surgery - cholecystectomy - laparoscopic and potential open techniques with this being the most definitive option. -Cholecystectomy with possible intraoperative cholangiogram was reviewed, material risks (including, but not limited to, pain, bleeding, infection, scarring, need for blood transfusion, damage to surrounding structures- blood vessels/nerves/viscus/organs, damage to bile duct, bile leak, need for additional procedures, hernia, worsening of pre-existing medical conditions, failure to resolve all abdominal symptoms, pancreatitis, pneumonia, heart attack, stroke, death) benefits and alternatives to surgery were discussed at length. I noted a good probability that the procedure would help improve her symptoms. The patient's questions were answered to her satisfaction, she voiced understanding and after considering her options, has elected to proceed with surgery. Additionally, we discussed typical postoperative expectations and the recovery process. -We have reivewed the procedure would be subject to OR availability tomorrow and scenarios where surgery may be delayed for more urgent procedures. She is aware this will be likely done with my partner, Dr. Sheliah Hatch, and will meet him ahead of time as well  Stephanie Coup. Cliffton Asters, M.D. [facility] Use [email or web address] to contact on call provider

## 2019-11-25 NOTE — ED Triage Notes (Addendum)
She went to [facility] this am for abdominal pain, headache, diarrhea and back pain. She left before being seen. She did have lab work but does not have the results. Here today stating symptoms are no better.

## 2019-11-25 NOTE — ED Notes (Signed)
Pt to go POV to [facility] ED. Pt made aware of directions and to report immediately without making any stops. Pt made aware to remain NPO and not to tamper with IV. Pt verbalizes understanding.

## 2019-11-25 NOTE — ED Notes (Signed)
Patient given sandwich, italian ice, water, and cheese stick.

## 2019-11-25 NOTE — Discharge Instructions (Addendum)
[facility] LAPAROSCOPIC SURGERY: POST OP INSTRUCTIONS Always review your discharge instruction sheet given to you by the facility where your surgery was performed. IF YOU HAVE DISABILITY OR FAMILY LEAVE FORMS, YOU MUST BRING THEM TO THE OFFICE FOR PROCESSING.   DO NOT GIVE THEM TO YOUR DOCTOR.  PAIN CONTROL  1. First take acetaminophen (Tylenol) AND/or ibuprofen (Advil) to control your pain after surgery.  Follow directions on package.  Taking acetaminophen (Tylenol) and/or ibuprofen (Advil) regularly after surgery will help to control your pain and lower the amount of prescription pain medication you may need.  You should not take more than 3,000 mg (3 grams) of acetaminophen (Tylenol) in 24 hours.  You should not take ibuprofen (Advil), aleve, motrin, naprosyn or other NSAIDS if you have a history of stomach ulcers or chronic kidney disease.  2. A prescription for pain medication may be given to you upon discharge.  Take your pain medication as prescribed, if you still have uncontrolled pain after taking acetaminophen (Tylenol) or ibuprofen (Advil). 3. Use ice packs to help control pain. 4. If you need a refill on your pain medication, please contact your pharmacy.  They will contact our office to request authorization. Prescriptions will not be filled after 5pm or on week-ends.  HOME MEDICATIONS 5. Take your usually prescribed medications unless otherwise directed.  DIET 6. You should follow a light diet the first few days after arrival home.  Be sure to include lots of fluids daily. Avoid fatty, fried foods.   CONSTIPATION 7. It is common to experience some constipation after surgery and if you are taking pain medication.  Increasing fluid intake and taking a stool softener (such as Colace) will usually help or prevent this problem from occurring.  A mild laxative (Milk of Magnesia or Miralax) should be taken according to package instructions if there are no bowel  movements after 48 hours.  WOUND/INCISION CARE 8. Most patients will experience some swelling and bruising in the area of the incisions.  Ice packs will help.  Swelling and bruising can take several days to resolve.  9. Unless discharge instructions indicate otherwise, follow guidelines below  a. STERI-STRIPS - you may remove your outer bandages 48 hours after surgery, and you may shower at that time.  You have steri-strips (small skin tapes) in place directly over the incision.  These strips should be left on the skin for 7-10 days.   b. DERMABOND/SKIN GLUE - you may shower in 24 hours.  The glue will flake off over the next 2-3 weeks. 10. Any sutures or staples will be removed at the office during your follow-up visit.  ACTIVITIES 11. You may resume regular (light) daily activities beginning the next day--such as daily self-care, walking, climbing stairs--gradually increasing activities as tolerated.  You may have sexual intercourse when it is comfortable.  Refrain from any heavy lifting or straining until approved by your doctor. a. You may drive when you are no longer taking prescription pain medication, you can comfortably wear a seatbelt, and you can safely maneuver your car and apply brakes.  FOLLOW-UP 12. You should see your doctor in the office for a follow-up appointment approximately 2-3 weeks after your surgery.  You should have been given your post-op/follow-up appointment when your surgery was scheduled.  If you did not receive a post-op/follow-up appointment, make sure that you call for this appointment within a day or two after you arrive home to insure a convenient appointment time.  WHEN   TO CALL YOUR DOCTOR: 1. Fever over 101.0 2. Inability to urinate 3. Continued bleeding from incision. 4. Increased pain, redness, or drainage from the incision. 5. Increasing abdominal pain  The clinic staff is available to answer your questions during regular business hours.  Please don't  hesitate to call and ask to speak to one of the nurses for clinical concerns.  If you have a medical emergency, go to the nearest emergency room or call 911.  A surgeon from [facility] is always on call at the hospital. [address], [address], [place], [place]  [address] ? [address] [address], [place], [place]   [address] [phone] ? [phone] ? FAX [phone] Web site: [email or web address]  

## 2019-11-25 NOTE — ED Triage Notes (Addendum)
Arrived POV from home. Patient seen at [facility] urgent Care yesterday for same (abdominal pain, diarrhea, and back pain). Patient reports she was told to follow up in ED if symptoms got worse. Patient reports these symptoms started on Friday. Patient also reports 2 episodes of loss of consciousness on Friday night and that she hit her head on the concrete. Patient reports pregnancy test was negative and negative for UTI at urgent care yesterday

## 2019-11-26 ENCOUNTER — Inpatient Hospital Stay (HOSPITAL_COMMUNITY): Payer: No Typology Code available for payment source | Admitting: Anesthesiology

## 2019-11-26 ENCOUNTER — Encounter (HOSPITAL_COMMUNITY): Admission: EM | Disposition: A | Discharge status: Home / Self Care | Payer: Self-pay | Source: Home / Self Care

## 2019-11-26 DIAGNOSIS — K8000 Calculus of gallbladder with acute cholecystitis without obstruction: Secondary | ICD-10-CM

## 2019-11-26 DIAGNOSIS — K810 Acute cholecystitis: Secondary | ICD-10-CM | POA: Diagnosis present

## 2019-11-26 HISTORY — DX: Calculus of gallbladder with acute cholecystitis without obstruction: K80.00

## 2019-11-26 HISTORY — PX: CHOLECYSTECTOMY: SHX55

## 2019-11-26 LAB — CBC
HCT: 33.5 % — ABNORMAL LOW (ref 36.0–46.0)
Hemoglobin: 10.5 g/dL — ABNORMAL LOW (ref 12.0–15.0)
MCH: 30.5 pg (ref 26.0–34.0)
MCHC: 31.3 g/dL (ref 30.0–36.0)
MCV: 97.4 fL (ref 80.0–100.0)
Platelets: 322 10*3/uL (ref 150–400)
RBC: 3.44 MIL/uL — ABNORMAL LOW (ref 3.87–5.11)
RDW: 11.9 % (ref 11.5–15.5)
WBC: 11.9 10*3/uL — ABNORMAL HIGH (ref 4.0–10.5)
nRBC: 0 % (ref 0.0–0.2)

## 2019-11-26 LAB — COMPREHENSIVE METABOLIC PANEL
ALT: 19 U/L (ref 0–44)
AST: 17 U/L (ref 15–41)
Albumin: 3.4 g/dL — ABNORMAL LOW (ref 3.5–5.0)
Alkaline Phosphatase: 54 U/L (ref 38–126)
Anion gap: 7 (ref 5–15)
BUN: 16 mg/dL (ref 6–20)
CO2: 24 mmol/L (ref 22–32)
Calcium: 8.6 mg/dL — ABNORMAL LOW (ref 8.9–10.3)
Chloride: 105 mmol/L (ref 98–111)
Creatinine, Ser: 0.83 mg/dL (ref 0.44–1.00)
GFR calc Af Amer: >60 mL/min (ref >60)
GFR calc non Af Amer: >60 mL/min (ref >60)
Glucose, Bld: 95 mg/dL (ref 70–99)
Potassium: 3.9 mmol/L (ref 3.5–5.1)
Sodium: 136 mmol/L (ref 135–145)
Total Bilirubin: 0.7 mg/dL (ref 0.3–1.2)
Total Protein: 6.5 g/dL (ref 6.5–8.1)

## 2019-11-26 LAB — PREGNANCY, URINE: Preg Test, Ur: NEGATIVE

## 2019-11-26 LAB — HIV ANTIBODY (ROUTINE TESTING W REFLEX): HIV Screen 4th Generation wRfx: NONREACTIVE

## 2019-11-26 SURGERY — LAPAROSCOPIC CHOLECYSTECTOMY WITH INTRAOPERATIVE CHOLANGIOGRAM
Anesthesia: General

## 2019-11-26 MED ORDER — HEPARIN SODIUM (PORCINE) 5000 UNIT/ML IJ SOLN
5000.0000 [IU] | Freq: Three times a day (TID) | INTRAMUSCULAR | Status: DC
  Administered 2019-11-26: 5000 [IU] via SUBCUTANEOUS
  Filled 2019-11-26: qty 1

## 2019-11-26 MED ORDER — DIPHENHYDRAMINE HCL 50 MG/ML IJ SOLN: 12.5000 mg | Freq: Four times a day (QID) | INTRAMUSCULAR | Status: DC | PRN

## 2019-11-26 MED ORDER — ACETAMINOPHEN 500 MG PO TABS
1000.0000 mg | ORAL_TABLET | Freq: Four times a day (QID) | ORAL | Status: DC
  Administered 2019-11-26 (×2): 1000 mg via ORAL
  Filled 2019-11-26 (×3): qty 2

## 2019-11-26 MED ORDER — ROCURONIUM BROMIDE 10 MG/ML (PF) SYRINGE
PREFILLED_SYRINGE | INTRAVENOUS | Status: AC
  Filled 2019-11-26: qty 10

## 2019-11-26 MED ORDER — MIDAZOLAM HCL 5 MG/5ML IJ SOLN
INTRAMUSCULAR | Status: DC | PRN
  Administered 2019-11-26: 2 mg via INTRAVENOUS

## 2019-11-26 MED ORDER — SCOPOLAMINE 1 MG/3DAYS TD PT72
1.0000 | MEDICATED_PATCH | TRANSDERMAL | Status: DC
  Administered 2019-11-26: 1.5 mg via TRANSDERMAL
  Filled 2019-11-26: qty 1

## 2019-11-26 MED ORDER — ONDANSETRON HCL 4 MG/2ML IJ SOLN
INTRAMUSCULAR | Status: AC
  Filled 2019-11-26: qty 2

## 2019-11-26 MED ORDER — FENTANYL CITRATE (PF) 100 MCG/2ML IJ SOLN
INTRAMUSCULAR | Status: DC | PRN
  Administered 2019-11-26 (×2): 100 ug via INTRAVENOUS
  Administered 2019-11-26: 50 ug via INTRAVENOUS

## 2019-11-26 MED ORDER — SUGAMMADEX SODIUM 200 MG/2ML IV SOLN
INTRAVENOUS | Status: DC | PRN
  Administered 2019-11-26: 200 mg via INTRAVENOUS

## 2019-11-26 MED ORDER — TRAMADOL HCL 50 MG PO TABS
50.0000 mg | ORAL_TABLET | Freq: Four times a day (QID) | ORAL | Status: DC | PRN
  Administered 2019-11-26: 50 mg via ORAL
  Filled 2019-11-26: qty 1

## 2019-11-26 MED ORDER — BUPIVACAINE-EPINEPHRINE 0.25% -1:200000 IJ SOLN
INTRAMUSCULAR | Status: DC | PRN
  Administered 2019-11-26: 30 mL

## 2019-11-26 MED ORDER — METHOCARBAMOL 500 MG PO TABS: 500.0000 mg | ORAL_TABLET | Freq: Four times a day (QID) | ORAL | Status: DC | PRN

## 2019-11-26 MED ORDER — RINGERS IRRIGATION IR SOLN
Status: DC | PRN
  Administered 2019-11-26: 1000 mL

## 2019-11-26 MED ORDER — OXYCODONE HCL 5 MG PO TABS
5.0000 mg | ORAL_TABLET | ORAL | Status: DC | PRN
  Administered 2019-11-27: 5 mg via ORAL
  Filled 2019-11-26: qty 1

## 2019-11-26 MED ORDER — DEXAMETHASONE SODIUM PHOSPHATE 10 MG/ML IJ SOLN
INTRAMUSCULAR | Status: AC
  Filled 2019-11-26: qty 1

## 2019-11-26 MED ORDER — HYDROMORPHONE HCL 1 MG/ML IJ SOLN
0.5000 mg | INTRAMUSCULAR | Status: DC | PRN
  Administered 2019-11-26: 0.5 mg via INTRAVENOUS
  Filled 2019-11-26: qty 0.5

## 2019-11-26 MED ORDER — ONDANSETRON HCL 4 MG/2ML IJ SOLN
4.0000 mg | Freq: Four times a day (QID) | INTRAMUSCULAR | Status: DC | PRN
  Administered 2019-11-26: 4 mg via INTRAVENOUS
  Filled 2019-11-26: qty 2

## 2019-11-26 MED ORDER — HYDROMORPHONE HCL 1 MG/ML IJ SOLN: 1.0000 mg | INTRAMUSCULAR | Status: DC | PRN

## 2019-11-26 MED ORDER — MIDAZOLAM HCL 2 MG/2ML IJ SOLN
INTRAMUSCULAR | Status: AC
  Filled 2019-11-26: qty 2

## 2019-11-26 MED ORDER — FENTANYL CITRATE (PF) 250 MCG/5ML IJ SOLN
INTRAMUSCULAR | Status: AC
  Filled 2019-11-26: qty 5

## 2019-11-26 MED ORDER — PHENYLEPHRINE 40 MCG/ML (10ML) SYRINGE FOR IV PUSH (FOR BLOOD PRESSURE SUPPORT)
PREFILLED_SYRINGE | INTRAVENOUS | Status: AC
  Filled 2019-11-26: qty 20

## 2019-11-26 MED ORDER — PROPOFOL 10 MG/ML IV BOLUS
INTRAVENOUS | Status: AC
  Filled 2019-11-26: qty 20

## 2019-11-26 MED ORDER — ROCURONIUM BROMIDE 10 MG/ML (PF) SYRINGE
PREFILLED_SYRINGE | INTRAVENOUS | Status: DC | PRN
  Administered 2019-11-26: 40 mg via INTRAVENOUS

## 2019-11-26 MED ORDER — LIDOCAINE 2% (20 MG/ML) 5 ML SYRINGE
INTRAMUSCULAR | Status: AC
  Filled 2019-11-26: qty 5

## 2019-11-26 MED ORDER — IBUPROFEN 200 MG PO TABS: 600.0000 mg | ORAL_TABLET | Freq: Four times a day (QID) | ORAL | Status: DC | PRN

## 2019-11-26 MED ORDER — ONDANSETRON HCL 4 MG/2ML IJ SOLN
INTRAMUSCULAR | Status: DC | PRN
  Administered 2019-11-26: 4 mg via INTRAVENOUS

## 2019-11-26 MED ORDER — LIDOCAINE 2% (20 MG/ML) 5 ML SYRINGE
INTRAMUSCULAR | Status: DC | PRN
  Administered 2019-11-26: 60 mg via INTRAVENOUS

## 2019-11-26 MED ORDER — LACTATED RINGERS IV SOLN: INTRAVENOUS | Status: DC

## 2019-11-26 MED ORDER — PROMETHAZINE HCL 25 MG/ML IJ SOLN
12.5000 mg | Freq: Four times a day (QID) | INTRAMUSCULAR | Status: DC | PRN
  Administered 2019-11-26: 12.5 mg via INTRAVENOUS
  Filled 2019-11-26 (×2): qty 1

## 2019-11-26 MED ORDER — ONDANSETRON 4 MG PO TBDP: 4.0000 mg | ORAL_TABLET | Freq: Four times a day (QID) | ORAL | Status: DC | PRN

## 2019-11-26 MED ORDER — DIPHENHYDRAMINE HCL 12.5 MG/5ML PO ELIX: 12.5000 mg | ORAL_SOLUTION | Freq: Four times a day (QID) | ORAL | Status: DC | PRN

## 2019-11-26 MED ORDER — DEXTROSE-NACL 5-0.45 % IV SOLN: INTRAVENOUS | Status: DC

## 2019-11-26 MED ORDER — BUPIVACAINE-EPINEPHRINE (PF) 0.25% -1:200000 IJ SOLN
INTRAMUSCULAR | Status: AC
  Filled 2019-11-26: qty 30

## 2019-11-26 MED ORDER — DEXAMETHASONE SODIUM PHOSPHATE 10 MG/ML IJ SOLN
INTRAMUSCULAR | Status: DC | PRN
  Administered 2019-11-26: 10 mg via INTRAVENOUS

## 2019-11-26 MED ORDER — ONDANSETRON HCL 4 MG/2ML IJ SOLN
4.0000 mg | Freq: Four times a day (QID) | INTRAMUSCULAR | Status: DC | PRN
  Administered 2019-11-26 – 2019-11-27 (×2): 4 mg via INTRAVENOUS
  Filled 2019-11-26 (×2): qty 2

## 2019-11-26 MED ORDER — MORPHINE SULFATE (PF) 2 MG/ML IV SOLN
2.0000 mg | INTRAVENOUS | Status: DC | PRN
  Administered 2019-11-26: 2 mg via INTRAVENOUS
  Filled 2019-11-26: qty 1

## 2019-11-26 MED ORDER — PIPERACILLIN-TAZOBACTAM 3.375 G IVPB
3.3750 g | Freq: Three times a day (TID) | INTRAVENOUS | Status: DC
  Administered 2019-11-26 (×3): 3.375 g via INTRAVENOUS
  Filled 2019-11-26 (×5): qty 50

## 2019-11-26 MED ORDER — SIMETHICONE 80 MG PO CHEW
40.0000 mg | CHEWABLE_TABLET | Freq: Four times a day (QID) | ORAL | Status: DC | PRN
  Filled 2019-11-26: qty 1

## 2019-11-26 MED ORDER — CHLORHEXIDINE GLUCONATE 0.12 % MT SOLN
15.0000 mL | Freq: Once | OROMUCOSAL | Status: AC
  Administered 2019-11-26: 15 mL via OROMUCOSAL

## 2019-11-26 MED ORDER — 0.9 % SODIUM CHLORIDE (POUR BTL) OPTIME
TOPICAL | Status: DC | PRN
  Administered 2019-11-26: 1000 mL

## 2019-11-26 MED ORDER — PROPOFOL 10 MG/ML IV BOLUS
INTRAVENOUS | Status: DC | PRN
  Administered 2019-11-26: 150 mg via INTRAVENOUS

## 2019-11-26 SURGICAL SUPPLY — 40 items
APPLIER CLIP ROT 10 11.4 M/L (STAPLE)
BENZOIN TINCTURE PRP APPL 2/3 (GAUZE/BANDAGES/DRESSINGS) ×2 IMPLANT
BNDG ADH 1X3 SHEER STRL LF (GAUZE/BANDAGES/DRESSINGS) ×8 IMPLANT
CABLE HIGH FREQUENCY MONO STRZ (ELECTRODE) ×2 IMPLANT
CATH CHOLANG 76X19 KUMAR (CATHETERS) ×2 IMPLANT
CHLORAPREP W/TINT 26 (MISCELLANEOUS) ×2 IMPLANT
CLIP APPLIE ROT 10 11.4 M/L (STAPLE) IMPLANT
CLIP VESOLOCK LG 6/CT PURPLE (CLIP) IMPLANT
CLIP VESOLOCK MED LG 6/CT (CLIP) ×4 IMPLANT
COVER MAYO STAND STRL (DRAPES) ×2 IMPLANT
COVER SURGICAL LIGHT HANDLE (MISCELLANEOUS) ×2 IMPLANT
COVER WAND RF STERILE (DRAPES) IMPLANT
DECANTER SPIKE VIAL GLASS SM (MISCELLANEOUS) ×2 IMPLANT
DRAIN CHANNEL 19F RND (DRAIN) IMPLANT
DRAPE C-ARM 42X120 X-RAY (DRAPES) IMPLANT
EVACUATOR SILICONE 100CC (DRAIN) IMPLANT
GLOVE BIOGEL PI IND STRL 7.0 (GLOVE) ×1 IMPLANT
GLOVE BIOGEL PI INDICATOR 7.0 (GLOVE) ×1
GLOVE SURG SS PI 7.0 STRL IVOR (GLOVE) ×2 IMPLANT
GOWN STRL REUS W/TWL LRG LVL3 (GOWN DISPOSABLE) ×2 IMPLANT
GOWN STRL REUS W/TWL XL LVL3 (GOWN DISPOSABLE) ×4 IMPLANT
GRASPER SUT TROCAR 14GX15 (MISCELLANEOUS) ×2 IMPLANT
KIT BASIN OR (CUSTOM PROCEDURE TRAY) ×2 IMPLANT
KIT TURNOVER KIT A (KITS) IMPLANT
POUCH RETRIEVAL ECOSAC 10 (ENDOMECHANICALS) ×1 IMPLANT
POUCH RETRIEVAL ECOSAC 10MM (ENDOMECHANICALS) ×1
SCISSORS LAP 5X35 DISP (ENDOMECHANICALS) ×2 IMPLANT
SET IRRIG TUBING LAPAROSCOPIC (IRRIGATION / IRRIGATOR) ×2 IMPLANT
SET TUBE SMOKE EVAC HIGH FLOW (TUBING) ×2 IMPLANT
SLEEVE XCEL OPT CAN 5 100 (ENDOMECHANICALS) ×4 IMPLANT
STOPCOCK 4 WAY LG BORE MALE ST (IV SETS) IMPLANT
STRIP CLOSURE SKIN 1/2X4 (GAUZE/BANDAGES/DRESSINGS) ×2 IMPLANT
SUT ETHILON 2 0 PS N (SUTURE) IMPLANT
SUT MNCRL AB 4-0 PS2 18 (SUTURE) ×2 IMPLANT
SUT VICRYL 0 ENDOLOOP (SUTURE) IMPLANT
TOWEL OR 17X26 10 PK STRL BLUE (TOWEL DISPOSABLE) ×2 IMPLANT
TOWEL OR NON WOVEN STRL DISP B (DISPOSABLE) IMPLANT
TRAY LAPAROSCOPIC (CUSTOM PROCEDURE TRAY) ×2 IMPLANT
TROCAR BLADELESS OPT 5 100 (ENDOMECHANICALS) ×2 IMPLANT
TROCAR XCEL NON-BLD 11X100MML (ENDOMECHANICALS) ×2 IMPLANT

## 2019-11-26 NOTE — Progress Notes (Signed)
Pt arrived to unit via wheelchair. Pt mom was following behind. Pt does not appear to be in distress at this time. Pt has been introduced to unit and call bell is within reach. Pt denies any request at this time. Bed in lowest position and locked. Will continue to monitor.

## 2019-11-26 NOTE — Progress Notes (Signed)
Paged MD Angelena Form  for IV pain medication. Pt is NPO MD stated patient could have oral medication after reiterating that PT is NPO. Will continue to monitor.

## 2019-11-26 NOTE — Op Note (Signed)
PATIENT:  Rebecca Rocha  19 y.o. female  PRE-OPERATIVE DIAGNOSIS:  ACUTE CHOLECYSTITIS  POST-OPERATIVE DIAGNOSIS:  ACUTE CHOLECYSTITIS  PROCEDURE:  Procedure(s): LAPAROSCOPIC CHOLECYSTECTOMY   SURGEON:  Surgeon(s): Deajah Erkkila, De Blanch, MD  ASSISTANT: none  ANESTHESIA:   local and general  Indications for procedure: Rebecca Rocha is a 19 y.o. female with symptoms of Abdominal pain and Nausea and vomiting consistent with gallbladder disease, Confirmed by Ultrasound.  Description of procedure: The patient was brought into the operative suite, placed supine. Anesthesia was administered with endotracheal tube. Patient was strapped in place and foot board was secured. All pressure points were offloaded by foam padding. The patient was prepped and draped in the usual sterile fashion.  A small incision was made to the right of the umbilicus. A 44mm trocar was inserted into the peritoneal cavity with optical entry. Pneumoperitoneum was applied with high flow low pressure. 2 54mm trocars were placed in the RUQ. A 83mm trocar was placed in the subxiphoid space. Marcaine was infused to the subxiphoid space and lateral upper right abdomen in the transversus abdominis plane. Next the patient was placed in reverse trendelenberg. The gallbladder was edematous and inflamed.  The gallbladder was retracted cephalad and lateral. The peritoneum was reflected off the infundibulum working lateral to medial. The cystic duct and cystic artery were identified and further dissection revealed a critical view. The cystic duct and cystic artery were doubly clipped and ligated.   The gallbladder was removed off the liver bed with cautery. The Gallbladder was placed in a specimen bag. The gallbladder fossa was irrigated and hemostasis was applied with cautery. The gallbladder was removed via the 46mm trocar. The fascial defect was closed with interrupted 0 vicryl suture via laparoscopic  trans-fascial suture passer. Pneumoperitoneum was removed, all trocar were removed. All incisions were closed with 4-0 monocryl subcuticular stitch. The patient woke from anesthesia and was brought to PACU in stable condition. All counts were correct  Findings: cholecystitis  Specimen: gallbladder  Blood loss: 20 ml  Local anesthesia: 30 ml marcaine  Complications: none  PLAN OF CARE: Admit for overnight observation  PATIENT DISPOSITION:  PACU - hemodynamically stable.  Images:     Feliciana Rossetti, M.D. General, Bariatric, & Minimally Invasive Surgery [facility]

## 2019-11-26 NOTE — Progress Notes (Signed)
Per ED Jeanice Lim, RN. AC stated that it is ok for mom to stay with patient due to traveling from home to be with patient. Pt and Mom educated on visiting hour policy.

## 2019-11-26 NOTE — Anesthesia Procedure Notes (Signed)
Procedure Name: Intubation Date/Time: 11/26/2019 4:05 PM Performed by: Gerald Leitz, CRNA Pre-anesthesia Checklist: Patient identified, Patient being monitored, Timeout performed, Emergency Drugs available and Suction available Patient Re-evaluated:Patient Re-evaluated prior to induction Oxygen Delivery Method: Circle system utilized Preoxygenation: Pre-oxygenation with 100% oxygen Induction Type: IV induction Ventilation: Mask ventilation without difficulty Laryngoscope Size: Mac and 3 Grade View: Grade I Tube type: Oral Tube size: 7.0 mm Number of attempts: 1 Placement Confirmation: ETT inserted through vocal cords under direct vision,  positive ETCO2 and breath sounds checked- equal and bilateral Secured at: 21 cm Tube secured with: Tape Dental Injury: Teeth and Oropharynx as per pre-operative assessment

## 2019-11-26 NOTE — Anesthesia Postprocedure Evaluation (Signed)
Anesthesia Post Note  Patient: Rebecca Rocha  Procedure(s) Performed: LAPAROSCOPIC CHOLECYSTECTOMY (N/A )     Patient location during evaluation: PACU Anesthesia Type: General Level of consciousness: awake and alert Pain management: pain level controlled Vital Signs Assessment: post-procedure vital signs reviewed and stable Respiratory status: spontaneous breathing, nonlabored ventilation, respiratory function stable and patient connected to nasal cannula oxygen Cardiovascular status: blood pressure returned to baseline and stable Postop Assessment: no apparent nausea or vomiting Anesthetic complications: no   No complications documented.  Last Vitals:  Vitals:   11/26/19 1815 11/26/19 1836  BP: 116/75 127/90  Pulse: 66 (!) 59  Resp: 13 16  Temp: 37 C (!) 36.3 C  SpO2: 96% 98%    Last Pain:  Vitals:   11/26/19 1836  TempSrc:   PainSc: 6                  Rankin Coolman P Kristyn Obyrne

## 2019-11-26 NOTE — Transfer of Care (Signed)
Immediate Anesthesia Transfer of Care Note  Patient: Rebecca Rocha  Procedure(s) Performed: Procedure(s): LAPAROSCOPIC CHOLECYSTECTOMY (N/A)  Patient Location: PACU  Anesthesia Type:General  Level of Consciousness: Alert, Awake, Oriented  Airway & Oxygen Therapy: Patient Spontanous Breathing  Post-op Assessment: Report given to RN  Post vital signs: Reviewed and stable  Last Vitals:  Vitals:   11/26/19 1143 11/26/19 1450  BP: (!) 90/43 104/64  Pulse: (!) 56 61  Resp: 14 18  Temp: 36.8 C 36.8 C  SpO2: 99% 100%    Complications: No apparent anesthesia complications

## 2019-11-26 NOTE — Progress Notes (Signed)
Pre Procedure note for inpatients:   Rebecca Rocha has been scheduled for Procedure(s): LAPAROSCOPIC CHOLECYSTECTOMY POSSIBLE INTRAOPERATIVE CHOLANGIOGRAM (N/A) today. The various methods of treatment have been discussed with the patient. After consideration of the risks, benefits and treatment options the patient has consented to the planned procedure.   The patient has been seen and labs reviewed. There are no changes in the patient's condition to prevent proceeding with the planned procedure today.  Recent labs:  Lab Results  Component Value Date   WBC 11.9 (H) 11/26/2019   HGB 10.5 (L) 11/26/2019   HCT 33.5 (L) 11/26/2019   PLT 322 11/26/2019   GLUCOSE 95 11/26/2019   ALT 19 11/26/2019   AST 17 11/26/2019   NA 136 11/26/2019   K 3.9 11/26/2019   CL 105 11/26/2019   CREATININE 0.83 11/26/2019   BUN 16 11/26/2019   CO2 24 11/26/2019    Rodman Pickle, MD 11/26/2019 9:55 AM

## 2019-11-26 NOTE — Anesthesia Preprocedure Evaluation (Addendum)
Anesthesia Evaluation  Patient identified by MRN, date of birth, ID band Patient awake    Reviewed: Allergy & Precautions, NPO status , Patient's Chart, lab work & pertinent test results  Airway Mallampati: I  TM Distance: >3 FB Neck ROM: Full    Dental no notable dental hx.    Pulmonary asthma (controlled) ,    Pulmonary exam normal breath sounds clear to auscultation       Cardiovascular negative cardio ROS Normal cardiovascular exam Rhythm:Regular Rate:Normal     Neuro/Psych  Headaches, Anxiety Vertigo     GI/Hepatic Neg liver ROS, N/V   Endo/Other  negative endocrine ROS  Renal/GU negative Renal ROS     Musculoskeletal negative musculoskeletal ROS (+)   Abdominal   Peds  Hematology  (+) anemia ,   Anesthesia Other Findings ACUTE CHOLECYSTITIS  Reproductive/Obstetrics hcg negative                            Anesthesia Physical Anesthesia Plan  ASA: II  Anesthesia Plan: General   Post-op Pain Management:    Induction: Intravenous and Rapid sequence  PONV Risk Score and Plan: 4 or greater and Scopolamine patch - Pre-op, Midazolam, Dexamethasone, Ondansetron and Treatment may vary due to age or medical condition  Airway Management Planned: Oral ETT  Additional Equipment:   Intra-op Plan:   Post-operative Plan: Extubation in OR  Informed Consent: I have reviewed the patients History and Physical, chart, labs and discussed the procedure including the risks, benefits and alternatives for the proposed anesthesia with the patient or authorized representative who has indicated his/her understanding and acceptance.     Dental advisory given  Plan Discussed with: CRNA  Anesthesia Plan Comments:        Anesthesia Quick Evaluation

## 2019-11-27 ENCOUNTER — Encounter (HOSPITAL_COMMUNITY): Payer: Self-pay | Admitting: General Surgery

## 2019-11-27 LAB — CBC
HCT: 39 % (ref 36.0–46.0)
Hemoglobin: 12.5 g/dL (ref 12.0–15.0)
MCH: 30.3 pg (ref 26.0–34.0)
MCHC: 32.1 g/dL (ref 30.0–36.0)
MCV: 94.4 fL (ref 80.0–100.0)
Platelets: 322 10*3/uL (ref 150–400)
RBC: 4.13 MIL/uL (ref 3.87–5.11)
RDW: 11.4 % — ABNORMAL LOW (ref 11.5–15.5)
WBC: 13 10*3/uL — ABNORMAL HIGH (ref 4.0–10.5)
nRBC: 0 % (ref 0.0–0.2)

## 2019-11-27 MED ORDER — IBUPROFEN 200 MG PO TABS: 200.0000 mg | ORAL_TABLET | Freq: Three times a day (TID) | ORAL | Status: DC | PRN

## 2019-11-27 MED ORDER — ACETAMINOPHEN 500 MG PO TABS: 500.0000 mg | ORAL_TABLET | Freq: Four times a day (QID) | ORAL | 2 refills | Status: DC | PRN

## 2019-11-27 MED ORDER — ONDANSETRON 4 MG PO TBDP: 4.0000 mg | ORAL_TABLET | Freq: Four times a day (QID) | ORAL | 0 refills | Status: DC | PRN

## 2019-11-27 MED ORDER — OXYCODONE HCL 5 MG PO TABS: 5.0000 mg | ORAL_TABLET | Freq: Four times a day (QID) | ORAL | 0 refills | Status: DC | PRN

## 2019-11-27 NOTE — Discharge Summary (Signed)
[facility] Discharge Summary   Patient ID: Rebecca Rocha MRN: [id number] DOB/AGE: [birth year] 19 y.o.  Admit date: 11/25/2019 Discharge date: 11/27/2019   Discharge Diagnosis Patient Active Problem List   Diagnosis Date Noted  . Acute cholecystitis 11/26/2019  . Acute calculous cholecystitis 11/26/2019  . 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 Chronic upper back pain 09/18/2019  . Seizure-like activity (HCC) 01/08/2018  . 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 Asthma 10/27/2016    Imaging: CT ABDOMEN PELVIS W CONTRAST  Result Date: 11/25/2019 CLINICAL DATA:  Nausea and vomiting.  Back pain. EXAM: CT ABDOMEN AND PELVIS WITH CONTRAST TECHNIQUE: Multidetector CT imaging of the abdomen and pelvis was performed using the standard protocol following bolus administration of intravenous contrast. CONTRAST:  OMNIPAQUE IOHEXOL 300 MG/ML  SOLN COMPARISON:  None. FINDINGS: Lower chest: Unremarkable Hepatobiliary: Mild gallbladder wall thickening. No biliary dilatation. Heterogeneous hepatic enhancement likely related to early contrast bolus, the hepatic veins are not opacified. No focal liver lesion. Pancreas: Unremarkable Spleen: Heterogeneous enhancement attributed to arterial phase of contrast. Adrenals/Urinary Tract: Unremarkable Stomach/Bowel: Prominent stool throughout the colon favors constipation. The terminal ileum and appendix appear unremarkable. No dilated small bowel. Vascular/Lymphatic: Unremarkable Reproductive: Unremarkable Other: No supplemental non-categorized findings. Musculoskeletal: Unremarkable IMPRESSION: 1. Prominent stool throughout the colon favors constipation. 2. Mild gallbladder wall thickening, nonspecific, but cholecystitis is not excluded. Correlate with any localized tenderness in the right upper quadrant. 3. Electronically Signed   By: Gaylyn Rong M.D.   On: 11/25/2019 15:30   US Abdomen Limited RUQ  Result Date: 11/25/2019 CLINICAL  DATA:  Abdominal pain for 5 days EXAM: ULTRASOUND ABDOMEN LIMITED RIGHT UPPER QUADRANT COMPARISON:  None. FINDINGS: Gallbladder: There is diffuse edematous hypervascular gallbladder wall thickening measuring up to 11.6 mm. There is a positive sonographic Murphy sign. However no definite layering echogenic stones are seen. Common bile duct: Diameter: 2.8 mm Liver: No focal lesion identified. Within normal limits in parenchymal echogenicity. Portal vein is patent on color Doppler imaging with normal direction of blood flow towards the liver. Other: None. IMPRESSION: Findings which could be suggestive acute acalculous cholecystitis. Electronically Signed   By: Jonna Clark M.D.   On: 11/25/2019 16:43    Procedures Dr. Franky Macho Kinsinger (11/26/19) - Laparoscopic Cholecystectomy   Hospital Course:  19 y/o F who presented to [facility] with RUQ and epigastric pain associated with nausea.  Workup showed acute cholecystitis.  Patient was admitted and underwent procedure listed above.  Tolerated procedure well and was transferred to the floor.  Diet was advanced as tolerated.  On POD#1, the patient was voiding well, tolerating diet, ambulating well, pain well controlled, vital signs stable, incisions c/d/i and felt stable for discharge home.  Patient will follow up in our office in 2 weeks and knows to call with questions or concerns. She will call to confirm appointment date/time.    Physical Exam: General:  Alert, NAD, pleasant, comfortable Abd:  Soft, ND, mild tenderness, incisions C/D/I with steri-strips and band-aids in place.  Allergies as of 11/27/2019   No Known Allergies     Medication List    TAKE these medications   acetaminophen 500 MG tablet Commonly known as: TYLENOL Take 1-2 tablets (500-1,000 mg total) by mouth every 6 (six) hours as needed for mild pain or moderate pain.   albuterol 108 (90 Base) MCG/ACT inhaler Commonly known as: VENTOLIN HFA Inhale into the lungs every 6 (six) hours as  needed for wheezing or shortness of breath.   ibuprofen 200  MG tablet Commonly known as: ADVIL Take 1-3 tablets (200-600 mg total) by mouth every 8 (eight) hours as needed for mild pain or moderate pain.   ondansetron 4 MG disintegrating tablet Commonly known as: ZOFRAN-ODT Take 1 tablet (4 mg total) by mouth every 6 (six) hours as needed for nausea.   oxyCODONE 5 MG immediate release tablet Commonly known as: Oxy IR/ROXICODONE Take 1 tablet (5 mg total) by mouth every 6 (six) hours as needed for moderate pain (no releived by tylenol or ibuprofen).         Follow-up Information    Go to  [facility].   Contact information: [address] [place] [phone]       [facility], [place] Follow up.   Specialty: General Surgery Why: our office is scheduling you for post-operative follow up. please call to confirm appointment date/time. Contact information: [address] [place] [address] [phone]              Signed: Hosie Spangle, [facility] 11/27/2019, 8:52 AM

## 2019-11-28 LAB — SURGICAL PATHOLOGY

## 2019-11-29 LAB — NASOPHARYNGEAL CULTURE: Special Requests: NORMAL

## 2019-12-08 ENCOUNTER — Ambulatory Visit (INDEPENDENT_AMBULATORY_CARE_PROVIDER_SITE_OTHER): Payer: No Typology Code available for payment source

## 2019-12-08 ENCOUNTER — Other Ambulatory Visit: Payer: Self-pay

## 2019-12-08 ENCOUNTER — Encounter: Payer: Self-pay | Admitting: Nurse Practitioner

## 2019-12-08 ENCOUNTER — Ambulatory Visit (INDEPENDENT_AMBULATORY_CARE_PROVIDER_SITE_OTHER): Payer: No Typology Code available for payment source | Admitting: Nurse Practitioner

## 2019-12-08 VITALS — BP 108/64 | HR 73 | Temp 98.6°F | Ht 61.0 in | Wt 140.6 lb

## 2019-12-08 DIAGNOSIS — G43909 Migraine, unspecified, not intractable, without status migrainosus: Secondary | ICD-10-CM | POA: Insufficient documentation

## 2019-12-08 DIAGNOSIS — R0602 Shortness of breath: Secondary | ICD-10-CM

## 2019-12-08 DIAGNOSIS — G44229 Chronic tension-type headache, not intractable: Secondary | ICD-10-CM | POA: Diagnosis not present

## 2019-12-08 DIAGNOSIS — R1011 Right upper quadrant pain: Secondary | ICD-10-CM | POA: Diagnosis not present

## 2019-12-08 DIAGNOSIS — J4521 Mild intermittent asthma with (acute) exacerbation: Secondary | ICD-10-CM

## 2019-12-08 DIAGNOSIS — Z9049 Acquired absence of other specified parts of digestive tract: Secondary | ICD-10-CM | POA: Diagnosis not present

## 2019-12-08 LAB — CBC WITH DIFFERENTIAL/PLATELET
Basophils Absolute: 0.1 10*3/uL (ref 0.0–0.1)
Basophils Relative: 0.6 % (ref 0.0–3.0)
Eosinophils Absolute: 0.4 10*3/uL (ref 0.0–0.7)
Eosinophils Relative: 3.5 % (ref 0.0–5.0)
HCT: 37.9 % (ref 36.0–46.0)
Hemoglobin: 12.4 g/dL (ref 12.0–15.0)
Lymphocytes Relative: 27 % (ref 12.0–46.0)
Lymphs Abs: 3.5 10*3/uL (ref 0.7–4.0)
MCHC: 32.7 g/dL (ref 30.0–36.0)
MCV: 93.6 fl (ref 78.0–100.0)
Monocytes Absolute: 0.7 10*3/uL (ref 0.1–1.0)
Monocytes Relative: 5.1 % (ref 3.0–12.0)
Neutro Abs: 8.3 10*3/uL — ABNORMAL HIGH (ref 1.4–7.7)
Neutrophils Relative %: 63.8 % (ref 43.0–77.0)
Platelets: 340 10*3/uL (ref 150.0–400.0)
RBC: 4.05 Mil/uL (ref 3.87–5.11)
RDW: 12.1 % (ref 11.5–15.5)
WBC: 13 10*3/uL — ABNORMAL HIGH (ref 4.0–10.5)

## 2019-12-08 LAB — COMPREHENSIVE METABOLIC PANEL
ALT: 20 U/L (ref 0–35)
AST: 20 U/L (ref 0–37)
Albumin: 4.3 g/dL (ref 3.5–5.2)
Alkaline Phosphatase: 69 U/L (ref 39–117)
BUN: 12 mg/dL (ref 6–23)
CO2: 25 mEq/L (ref 19–32)
Calcium: 9.6 mg/dL (ref 8.4–10.5)
Chloride: 105 mEq/L (ref 96–112)
Creatinine, Ser: 0.66 mg/dL (ref 0.40–1.20)
GFR: 112.82 mL/min (ref >60.00)
Glucose, Bld: 84 mg/dL (ref 70–99)
Potassium: 3.8 mEq/L (ref 3.5–5.1)
Sodium: 138 mEq/L (ref 135–145)
Total Bilirubin: 0.4 mg/dL (ref 0.2–1.2)
Total Protein: 7.9 g/dL (ref 6.0–8.3)

## 2019-12-08 LAB — LIPASE: Lipase: 28 U/L (ref 11.0–59.0)

## 2019-12-08 MED ORDER — ALBUTEROL SULFATE HFA 108 (90 BASE) MCG/ACT IN AERS: 1.0000 | INHALATION_SPRAY | Freq: Four times a day (QID) | RESPIRATORY_TRACT | 0 refills | Status: DC | PRN

## 2019-12-08 MED ORDER — RIZATRIPTAN BENZOATE 5 MG PO TABS: 5.0000 mg | ORAL_TABLET | ORAL | 0 refills | Status: DC | PRN

## 2019-12-08 NOTE — Progress Notes (Signed)
Subjective:  Patient ID: Rebecca Rocha, female    DOB: 2000-10-26  Age: 19 y.o. MRN: [id number]  CC: Follow-up (refill on  Albuterol, patient C/O migraines that will not go away, pain under right breast patient would like to know if she coud take something other than oxycodone because that makes her to sleepy. )  Headache  This is a chronic problem. The current episode started more than 1 year ago. The problem occurs constantly. The problem has been gradually worsening. The pain is located in the frontal region. Radiates to: occipital region. The pain quality is similar to prior headaches. The quality of the pain is described as aching, band-like and dull. The pain is moderate. Associated symptoms include abdominal pain. Pertinent negatives include no abnormal behavior, anorexia, blurred vision, dizziness, eye pain, eye redness, eye watering, fever, hearing loss, insomnia, loss of balance, neck pain, numbness, phonophobia, photophobia, rhinorrhea, scalp tenderness, seizures, sinus pressure, tingling, tinnitus, visual change or weakness. The symptoms are aggravated by emotional stress. She has tried NSAIDs for the symptoms. The treatment provided mild relief. There is no history of cluster headaches, hypertension, immunosuppression, migraines in the family, obesity, pseudotumor cerebri, recent head traumas, sinus disease or TMJ.  Shortness of Breath This is a new problem. The current episode started in the past 7 days. The problem occurs daily. The problem has been waxing and waning. Associated symptoms include abdominal pain, headaches and wheezing. Pertinent negatives include no chest pain, claudication, fever, neck pain, orthopnea, PND, rhinorrhea, sputum production or syncope. The symptoms are aggravated by any activity. She has tried beta agonist inhalers for the symptoms. The treatment provided significant relief. Her past medical history is significant for asthma.  SOB with exertion and  increase use of albuterol since hospital discharge.  S/p lap cholecystectomy, post op day #12, has upcoming appt with surgeon 12/15/2019, she is able to tolerate small frequent meals but accompanied by nausea and RUQ pain. Has some relief with use of zofran, reports normal BM (last BM yesterday)..  Reviewed past Medical, Social and Family history today.  Outpatient Medications Prior to Visit  Medication Sig Dispense Refill  . acetaminophen (TYLENOL) 500 MG tablet Take 1-2 tablets (500-1,000 mg total) by mouth every 6 (six) hours as needed for mild pain or moderate pain. 100 tablet 2  . ondansetron (ZOFRAN-ODT) 4 MG disintegrating tablet Take 1 tablet (4 mg total) by mouth every 6 (six) hours as needed for nausea. 20 tablet 0  . albuterol (VENTOLIN HFA) 108 (90 Base) MCG/ACT inhaler Inhale into the lungs every 6 (six) hours as needed for wheezing or shortness of breath.    Marland Kitchen ibuprofen (ADVIL) 200 MG tablet Take 1-3 tablets (200-600 mg total) by mouth every 8 (eight) hours as needed for mild pain or moderate pain.    Marland Kitchen oxyCODONE (OXY IR/ROXICODONE) 5 MG immediate release tablet Take 1 tablet (5 mg total) by mouth every 6 (six) hours as needed for moderate pain (no releived by tylenol or ibuprofen). (Patient not taking: Reported on 12/08/2019) 15 tablet 0   No facility-administered medications prior to visit.    ROS See HPI  Objective:  BP (!) 108/64   Pulse 73   Temp 98.6 F (37 C) (Tympanic)   Ht 5\' 1"  (1.549 m)   Wt 140 lb 9.6 oz (63.8 kg)   SpO2 97%   BMI 26.57 kg/m   Physical Exam Vitals reviewed.  Eyes:     General: No scleral icterus.    Extraocular Movements:  Extraocular movements intact.     Conjunctiva/sclera: Conjunctivae normal.     Pupils: Pupils are equal, round, and reactive to light.  Cardiovascular:     Rate and Rhythm: Normal rate and regular rhythm.     Pulses: Normal pulses.     Heart sounds: Normal heart sounds.  Pulmonary:     Effort: Pulmonary effort is  normal.     Breath sounds: No stridor. No wheezing, rhonchi or rales.     Comments: Diminished lungs sounds in bilateral bases Chest:     Chest wall: No tenderness.  Abdominal:     General: Bowel sounds are normal. There is no distension.     Palpations: Abdomen is soft.     Tenderness: There is abdominal tenderness in the right upper quadrant. There is guarding.    Musculoskeletal:     Cervical back: Normal range of motion and neck supple.  Lymphadenopathy:     Cervical: No cervical adenopathy.  Skin:    General: Skin is warm and dry.     Coloration: Skin is not jaundiced.  Neurological:     Mental Status: She is alert and oriented to person, place, and time.  Psychiatric:        Mood and Affect: Mood normal.        Behavior: Behavior normal.    Assessment & Plan:  This visit occurred during the SARS-CoV-2 public health emergency.  Safety protocols were in place, including screening questions prior to the visit, additional usage of staff PPE, and extensive cleaning of exam room while observing appropriate contact time as indicated for disinfecting solutions.   Vasilia was seen today for follow-up.  Diagnoses and all orders for this visit:  Mild intermittent asthma with acute exacerbation -     albuterol (VENTOLIN HFA) 108 (90 Base) MCG/ACT inhaler; Inhale 1-2 puffs into the lungs every 6 (six) hours as needed for wheezing or shortness of breath.  SOB (shortness of breath) on exertion -     DG Chest 2 View -     CBC w/Diff  Chronic tension-type headache, not intractable -     Discontinue: rizatriptan (MAXALT) 5 MG tablet; Take 1 tablet (5 mg total) by mouth as needed for migraine. May repeat in 2 hours if needed -     rizatriptan (MAXALT) 5 MG tablet; Take 1 tablet (5 mg total) by mouth as needed for migraine. May repeat in 2 hours if needed. No more than 30mg  in 24hrs  S/P laparoscopic cholecystectomy -     Lipase -     Comprehensive metabolic panel  Postprandial RUQ  pain -     Lipase -     Comprehensive metabolic panel -     CBC w/Diff    Problem List Items Addressed This Visit      Respiratory   Asthma - Primary   Relevant Medications   albuterol (VENTOLIN HFA) 108 (90 Base) MCG/ACT inhaler     Nervous and Auditory   Chronic tension-type headache, not intractable   Relevant Medications   rizatriptan (MAXALT) 5 MG tablet     Other   S/P laparoscopic cholecystectomy   Relevant Orders   Lipase   Comprehensive metabolic panel    Other Visit Diagnoses    SOB (shortness of breath) on exertion       Relevant Orders   DG Chest 2 View (Completed)   CBC w/Diff   Postprandial RUQ pain       Relevant Orders   Lipase  Comprehensive metabolic panel   CBC w/Diff      I have spent with this patient regarding history taking, documentation, review of recent hospitalization (labs, radiology, specialty note), formulating plan and discussing treatment options with patient.  Follow-up: No follow-ups on file.  Alysia Penna, NP

## 2019-12-08 NOTE — Patient Instructions (Addendum)
Use tylenol 500mg  every 6hrs as needed for RUQ ABD pain Avoid ibuprofen due to nauses post surgery. Use Maxalt for headache as prescribed  Go to lab for CXR and blood draw.  Maintain upcoming appt with surgeon 12/16/2019   Tension Headache, Adult A tension headache is pain, pressure, or aching in your head. Tension headaches can last from 30 minutes to several days. Follow these instructions at home: Managing pain  Take over-the-counter and prescription medicines only as told by your doctor.  When you have a headache, lie down in a dark, quiet room.  If told, put ice on your head and neck: ? Put ice in a plastic bag. ? Place a towel between your skin and the bag. ? Leave the ice on for 20 minutes, 2-3 times a day.  If told, put heat on the back of your neck. Do this as often as your doctor tells you to. Use the kind of heat that your doctor recommends, such as a moist heat pack or a heating pad. ? Place a towel between your skin and the heat. ? Leave the heat on for 20-30 minutes. ? Remove the heat if your skin turns bright red. Eating and drinking  Eat meals on a regular schedule.  Watch how much alcohol you drink: ? If you are a woman and are not pregnant, do not drink more than 1 drink a day. ? If you are a man, do not drink more than 2 drinks a day.  Drink enough fluid to keep your pee (urine) pale yellow.  Do not use a lot of caffeine, or stop using caffeine. Lifestyle  Get enough sleep. Get 7-9 hours of sleep each night. Or get the amount of sleep that your doctor tells you to.  At bedtime, remove all electronic devices from your room. Examples of electronic devices are computers, phones, and tablets.  Find ways to lessen your stress. Some things that can lessen stress are: ? Exercise. ? Deep breathing. ? Yoga. ? Music. ? Positive thoughts.  Sit up straight. Do not tighten (tense) your muscles.  Do not use any products that have nicotine or tobacco in them,  such as cigarettes and e-cigarettes. If you need help quitting, ask your doctor. General instructions   Keep all follow-up visits as told by your doctor. This is important.  Avoid things that can bring on headaches. Keep a journal to find out if certain things bring on headaches. For example, write down: ? What you eat and drink. ? How much sleep you get. ? Any change to your diet or medicines. Contact a doctor if:  Your headache does not get better.  Your headache comes back.  You have a headache and sounds, light, or smells bother you.  You feel sick to your stomach (nauseous) or you throw up (vomit).  Your stomach hurts. Get help right away if:  You suddenly get a very bad headache along with any of these: ? A stiff neck. ? Feeling sick to your stomach. ? Throwing up. ? Feeling weak. ? Trouble seeing. ? Feeling short of breath. ? A rash. ? Feeling unusually sleepy. ? Trouble speaking. ? Pain in your eye or ear. ? Trouble walking or balancing. ? Feeling like you will pass out (faint). ? Passing out. Summary  A tension headache is pain, pressure, or aching in your head.  Tension headaches can last from 30 minutes to several days.  Lifestyle changes and medicines may help relieve pain. This information  is not intended to replace advice given to you by your health care provider. Make sure you discuss any questions you have with your health care provider. Document Revised: 02/26/2019 Document Reviewed: 08/11/2016 Elsevier Patient Education  2020 [employer].

## 2019-12-12 ENCOUNTER — Ambulatory Visit
Admission: RE | Admit: 2019-12-12 | Discharge: 2019-12-12 | Disposition: A | Discharge status: Home / Self Care | Payer: No Typology Code available for payment source | Source: Ambulatory Visit | Attending: Family Medicine | Admitting: Family Medicine

## 2019-12-12 ENCOUNTER — Other Ambulatory Visit: Payer: Self-pay

## 2019-12-12 ENCOUNTER — Other Ambulatory Visit: Payer: Self-pay | Admitting: Family Medicine

## 2019-12-12 DIAGNOSIS — N6310 Unspecified lump in the right breast, unspecified quadrant: Secondary | ICD-10-CM

## 2019-12-16 ENCOUNTER — Encounter: Payer: Self-pay | Admitting: Obstetrics & Gynecology

## 2019-12-16 ENCOUNTER — Ambulatory Visit (INDEPENDENT_AMBULATORY_CARE_PROVIDER_SITE_OTHER): Payer: No Typology Code available for payment source | Admitting: Obstetrics & Gynecology

## 2019-12-16 ENCOUNTER — Other Ambulatory Visit: Payer: Self-pay

## 2019-12-16 VITALS — BP 102/68

## 2019-12-16 DIAGNOSIS — Z3046 Encounter for surveillance of implantable subdermal contraceptive: Secondary | ICD-10-CM

## 2019-12-16 DIAGNOSIS — Z3009 Encounter for other general counseling and advice on contraception: Secondary | ICD-10-CM

## 2019-12-16 NOTE — Progress Notes (Signed)
    Rebecca Rocha [birth year] [id number]        19 y.o.  G0 Engaged  RP: Contraception counseling  HPI: Currently well on Nexplanon.08/2018.  Well on Nexplanon.  No BTB.  No pelvic pain.  Had an episode of nausea in May with a false pos HPT.  SPT neg the next day. No recurrence since then.     OB History  Gravida Para Term Preterm AB Living  0 0 0 0 0 0  SAB TAB Ectopic Multiple Live Births  0 0 0 0 0    Past medical history,surgical history, problem list, medications, allergies, family history and social history were all reviewed and documented in the EPIC chart.   Directed ROS with pertinent positives and negatives documented in the history of present illness/assessment and plan.  Exam:  Vitals:   12/16/19 1537  BP: 102/68   General appearance:  Normal   Gynecologic exam: Deferred   Assessment/Plan:  19 y.o. G0P0000   1. Encounter for counseling regarding contraception Counseling on all types of contraception per patient request.  Usage risks and benefits of birth control pill, patch, NuvaRing, Skyla IUD and Mirena IUD as well as Depo-Provera injections reviewed thoroughly.  Patient voiced understanding.  At this time, will continue on Nexplanon until reaching 3 years in April 2023.  2. Encounter for surveillance of Nexplanon subdermal contraceptive On Nexplanon since April 2020.  Planning to continue with Nexplanon until reaching 3 years in April 2023.  Other orders - oxyCODONE-acetaminophen (PERCOCET/ROXICET) 5-325 MG tablet; Take by mouth every 4 (four) hours as needed for severe pain.  [name] MD, 3:58 PM 12/16/2019

## 2019-12-18 ENCOUNTER — Encounter: Payer: Self-pay | Admitting: Obstetrics & Gynecology

## 2019-12-26 ENCOUNTER — Other Ambulatory Visit: Payer: Self-pay | Admitting: Nurse Practitioner

## 2019-12-26 DIAGNOSIS — J4521 Mild intermittent asthma with (acute) exacerbation: Secondary | ICD-10-CM

## 2020-02-23 ENCOUNTER — Other Ambulatory Visit: Payer: Self-pay | Admitting: Nurse Practitioner

## 2020-02-23 DIAGNOSIS — J4521 Mild intermittent asthma with (acute) exacerbation: Secondary | ICD-10-CM

## 2020-02-25 ENCOUNTER — Telehealth: Payer: Self-pay | Admitting: Nurse Practitioner

## 2020-02-25 ENCOUNTER — Other Ambulatory Visit: Payer: Self-pay | Admitting: Nurse Practitioner

## 2020-02-25 DIAGNOSIS — G44229 Chronic tension-type headache, not intractable: Secondary | ICD-10-CM

## 2020-02-25 NOTE — Telephone Encounter (Signed)
Patient scheduled an appointment with her provider for breathing issues. Called patient to get additional information regarding symptoms, transferred patient to nurse triage line for further evaluation.

## 2020-02-26 ENCOUNTER — Other Ambulatory Visit: Payer: Self-pay

## 2020-02-27 ENCOUNTER — Encounter: Payer: Self-pay | Admitting: Nurse Practitioner

## 2020-02-27 ENCOUNTER — Ambulatory Visit (INDEPENDENT_AMBULATORY_CARE_PROVIDER_SITE_OTHER): Payer: No Typology Code available for payment source | Admitting: Nurse Practitioner

## 2020-02-27 ENCOUNTER — Ambulatory Visit (INDEPENDENT_AMBULATORY_CARE_PROVIDER_SITE_OTHER): Payer: No Typology Code available for payment source

## 2020-02-27 VITALS — BP 116/88 | HR 76 | Temp 98.2°F | Ht 61.0 in | Wt 142.0 lb

## 2020-02-27 DIAGNOSIS — J4531 Mild persistent asthma with (acute) exacerbation: Secondary | ICD-10-CM

## 2020-02-27 DIAGNOSIS — G4700 Insomnia, unspecified: Secondary | ICD-10-CM | POA: Diagnosis not present

## 2020-02-27 LAB — CBC WITH DIFFERENTIAL/PLATELET
Basophils Absolute: 0.1 10*3/uL (ref 0.0–0.1)
Basophils Relative: 1 % (ref 0.0–3.0)
Eosinophils Absolute: 0.2 10*3/uL (ref 0.0–0.7)
Eosinophils Relative: 2 % (ref 0.0–5.0)
HCT: 38.4 % (ref 36.0–46.0)
Hemoglobin: 12.4 g/dL (ref 12.0–15.0)
Lymphocytes Relative: 38.5 % (ref 12.0–46.0)
Lymphs Abs: 3.1 10*3/uL (ref 0.7–4.0)
MCHC: 32.2 g/dL (ref 30.0–36.0)
MCV: 94.3 fl (ref 78.0–100.0)
Monocytes Absolute: 0.6 10*3/uL (ref 0.1–1.0)
Monocytes Relative: 7.1 % (ref 3.0–12.0)
Neutro Abs: 4.2 10*3/uL (ref 1.4–7.7)
Neutrophils Relative %: 51.4 % (ref 43.0–77.0)
Platelets: 376 10*3/uL (ref 150.0–400.0)
RBC: 4.08 Mil/uL (ref 3.87–5.11)
RDW: 12.5 % (ref 11.5–15.5)
WBC: 8.1 10*3/uL (ref 4.0–10.5)

## 2020-02-27 LAB — TSH: TSH: 1.25 u[IU]/mL (ref 0.35–4.50)

## 2020-02-27 MED ORDER — TRAZODONE HCL 50 MG PO TABS: 25.0000 mg | ORAL_TABLET | Freq: Every evening | ORAL | 2 refills | Status: DC | PRN

## 2020-02-27 NOTE — Patient Instructions (Addendum)
Go to lab for blood draw and CXR  Use trazodone for sleep  Insomnia Insomnia is a sleep disorder that makes it difficult to fall asleep or stay asleep. Insomnia can cause fatigue, low energy, difficulty concentrating, mood swings, and poor performance at work or school. There are three different ways to classify insomnia:  Difficulty falling asleep.  Difficulty staying asleep.  Waking up too early in the morning. Any type of insomnia can be long-term (chronic) or short-term (acute). Both are common. Short-term insomnia usually lasts for three months or less. Chronic insomnia occurs at least three times a week for longer than three months. What are the causes? Insomnia may be caused by another condition, situation, or substance, such as:  Anxiety.  Certain medicines.  Gastroesophageal reflux disease (GERD) or other gastrointestinal conditions.  Asthma or other breathing conditions.  Restless legs syndrome, sleep apnea, or other sleep disorders.  Chronic pain.  Menopause.  Stroke.  Abuse of alcohol, tobacco, or illegal drugs.  Mental health conditions, such as depression.  Caffeine.  Neurological disorders, such as Alzheimer's disease.  An overactive thyroid (hyperthyroidism). Sometimes, the cause of insomnia may not be known. What increases the risk? Risk factors for insomnia include:  Gender. Women are affected more often than men.  Age. Insomnia is more common as you get older.  Stress.  Lack of exercise.  Irregular work schedule or working night shifts.  Traveling between different time zones.  Certain medical and mental health conditions. What are the signs or symptoms? If you have insomnia, the main symptom is having trouble falling asleep or having trouble staying asleep. This may lead to other symptoms, such as:  Feeling fatigued or having low energy.  Feeling nervous about going to sleep.  Not feeling rested in the morning.  Having trouble  concentrating.  Feeling irritable, anxious, or depressed. How is this diagnosed? This condition may be diagnosed based on:  Your symptoms and medical history. Your health care provider may ask about: ? Your sleep habits. ? Any medical conditions you have. ? Your mental health.  A physical exam. How is this treated? Treatment for insomnia depends on the cause. Treatment may focus on treating an underlying condition that is causing insomnia. Treatment may also include:  Medicines to help you sleep.  Counseling or therapy.  Lifestyle adjustments to help you sleep better. Follow these instructions at home: Eating and drinking   Limit or avoid alcohol, caffeinated beverages, and cigarettes, especially close to bedtime. These can disrupt your sleep.  Do not eat a large meal or eat spicy foods right before bedtime. This can lead to digestive discomfort that can make it hard for you to sleep. Sleep habits   Keep a sleep diary to help you and your health care provider figure out what could be causing your insomnia. Write down: ? When you sleep. ? When you wake up during the night. ? How well you sleep. ? How rested you feel the next day. ? Any side effects of medicines you are taking. ? What you eat and drink.  Make your bedroom a dark, comfortable place where it is easy to fall asleep. ? Put up shades or blackout curtains to block light from outside. ? Use a white noise machine to block noise. ? Keep the temperature cool.  Limit screen use before bedtime. This includes: ? Watching TV. ? Using your smartphone, tablet, or computer.  Stick to a routine that includes going to bed and waking up at the  same times every day and night. This can help you fall asleep faster. Consider making a quiet activity, such as reading, part of your nighttime routine.  Try to avoid taking naps during the day so that you sleep better at night.  Get out of bed if you are still awake after 15  minutes of trying to sleep. Keep the lights down, but try reading or doing a quiet activity. When you feel sleepy, go back to bed. General instructions  Take over-the-counter and prescription medicines only as told by your health care provider.  Exercise regularly, as told by your health care provider. Avoid exercise starting several hours before bedtime.  Use relaxation techniques to manage stress. Ask your health care provider to suggest some techniques that may work well for you. These may include: ? Breathing exercises. ? Routines to release muscle tension. ? Visualizing peaceful scenes.  Make sure that you drive carefully. Avoid driving if you feel very sleepy.  Keep all follow-up visits as told by your health care provider. This is important. Contact a health care provider if:  You are tired throughout the day.  You have trouble in your daily routine due to sleepiness.  You continue to have sleep problems, or your sleep problems get worse. Get help right away if:  You have serious thoughts about hurting yourself or someone else. If you ever feel like you may hurt yourself or others, or have thoughts about taking your own life, get help right away. You can go to your nearest emergency department or call:  Your local emergency services (911 in the [place]  A suicide crisis helpline, such as the [facility] at 270-666-3768. This is open 24 hours a day. Summary  Insomnia is a sleep disorder that makes it difficult to fall asleep or stay asleep.  Insomnia can be long-term (chronic) or short-term (acute).  Treatment for insomnia depends on the cause. Treatment may focus on treating an underlying condition that is causing insomnia.  Keep a sleep diary to help you and your health care provider figure out what could be causing your insomnia. This information is not intended to replace advice given to you by your health care provider. Make sure you discuss  any questions you have with your health care provider. Document Revised: 04/13/2017 Document Reviewed: 02/08/2017 Elsevier Patient Education  2020 [employer].

## 2020-02-27 NOTE — Progress Notes (Signed)
Subjective:  Patient ID: Rebecca Rocha, female    DOB: 2000/12/24  Age: 19 y.o. MRN: [id number]  CC: Shortness of Breath (Pt stated--having shortness of breath in the middle of the night and morning when getting out of the bed-2 days)  Insomnia Primary symptoms: fragmented sleep, sleep disturbance, no somnolence, frequent awakening, premature morning awakening, malaise/fatigue, no napping.  The current episode started more than one month. The onset quality is gradual. The problem occurs nightly. The problem is unchanged. The symptoms are aggravated by anxiety and bed partner. How many drinks containing alcohol do you have on a typical day when you are drinking: none.  Nothing relieves the symptoms. Past treatments include meditation (10mg  melatonin and nyquil). Typical bedtime:  8-10 P.M..  How long after going to bed to you fall asleep: 15-30 minutes.   Sleep duration: none.  PMH includes: associated symptoms present, no hypertension, no depression, family stress or anxiety, no restless leg syndrome, work related stressors, no chronic pain, no apnea.  Prior diagnostic workup includes:  Blood work.  Asthma She complains of chest tightness, difficulty breathing and shortness of breath. There is no cough, frequent throat clearing, hemoptysis, hoarse voice, sputum production or wheezing. This is a recurrent problem. The current episode started more than 1 month ago. The problem occurs daily. The problem has been waxing and waning. Associated symptoms include malaise/fatigue. Pertinent negatives include no appetite change, chest pain, dyspnea on exertion, ear congestion, ear pain, fever, headaches, heartburn, myalgias, nasal congestion, orthopnea, PND, postnasal drip, rhinorrhea, sneezing, sore throat, sweats, trouble swallowing or weight loss. Her symptoms are aggravated by nothing. Her symptoms are alleviated by beta-agonist. She reports minimal improvement on treatment. Her symptoms are not  alleviated by rest. Her past medical history is significant for asthma.   Wt Readings from Last 3 Encounters:  02/27/20 142 lb (64.4 kg)  12/08/19 140 lb 9.6 oz (63.8 kg)  11/25/19 145 lb 1 oz (65.8 kg)   Reviewed past Medical, Social and Family history today.  Outpatient Medications Prior to Visit  Medication Sig Dispense Refill  . acetaminophen (TYLENOL) 500 MG tablet Take 1-2 tablets (500-1,000 mg total) by mouth every 6 (six) hours as needed for mild pain or moderate pain. 100 tablet 2  . albuterol (VENTOLIN HFA) 108 (90 Base) MCG/ACT inhaler Inhale 1-2 puffs into the lungs every 6 (six) hours as needed for wheezing or shortness of breath. Need office visit for additional refill 6.7 g 0  . rizatriptan (MAXALT) 10 MG tablet Take 1 tablet (10 mg total) by mouth as needed for migraine. Every 2hrs as needed. No more than 30mg  in 24hrs. Need office visit for additional refills 9 tablet 0  . ondansetron (ZOFRAN-ODT) 4 MG disintegrating tablet Take 1 tablet (4 mg total) by mouth every 6 (six) hours as needed for nausea. 20 tablet 0  . oxyCODONE-acetaminophen (PERCOCET/ROXICET) 5-325 MG tablet Take by mouth every 4 (four) hours as needed for severe pain.     No facility-administered medications prior to visit.    ROS See HPI  Objective:  BP 116/88   Pulse 76   Temp 98.2 F (36.8 C)   Ht 5\' 1"  (1.549 m)   Wt 142 lb (64.4 kg)   SpO2 98%   BMI 26.83 kg/m   Physical Exam Constitutional:      General: She is not in acute distress.    Appearance: She is not diaphoretic.  Pulmonary:     Effort: Pulmonary effort is normal.  Breath sounds: Normal breath sounds.  Chest:     Chest wall: No tenderness.  Musculoskeletal:     Right lower leg: No edema.     Left lower leg: No edema.  Neurological:     Mental Status: She is alert and oriented to person, place, and time.    Assessment & Plan:  This visit occurred during the SARS-CoV-2 public health emergency.  Safety protocols were  in place, including screening questions prior to the visit, additional usage of staff PPE, and extensive cleaning of exam room while observing appropriate contact time as indicated for disinfecting solutions.   Jaquasha was seen today for shortness of breath.  Diagnoses and all orders for this visit:  Mild persistent asthma with acute exacerbation -     DG Chest 2 View -     fluticasone (FLOVENT HFA) 110 MCG/ACT inhaler; Inhale 1 puff into the lungs 2 (two) times daily. Rinse mouth after each use -     Pulmonary Function Test; Future  Insomnia, unspecified type -     TSH -     CBC with Differential/Platelet -     Discontinue: traZODone (DESYREL) 50 MG tablet; Take 0.5-1 tablets (25-50 mg total) by mouth at bedtime as needed for sleep. -     traZODone (DESYREL) 50 MG tablet; Take 0.5-1 tablets (25-50 mg total) by mouth at bedtime as needed for sleep.    Problem List Items Addressed This Visit      Respiratory   Asthma - Primary   Relevant Medications   fluticasone (FLOVENT HFA) 110 MCG/ACT inhaler   Other Relevant Orders   DG Chest 2 View (Completed)   Pulmonary Function Test    Other Visit Diagnoses    Insomnia, unspecified type       Relevant Medications   traZODone (DESYREL) 50 MG tablet   Other Relevant Orders   TSH (Completed)   CBC with Differential/Platelet (Completed)      Follow-up: Return in about 2 weeks (around 03/12/2020) for insomnia (video).  Alysia Penna, NP

## 2020-03-01 MED ORDER — FLOVENT HFA 110 MCG/ACT IN AERO: 1.0000 | INHALATION_SPRAY | Freq: Two times a day (BID) | RESPIRATORY_TRACT | 2 refills | Status: DC

## 2020-03-11 ENCOUNTER — Other Ambulatory Visit: Payer: Self-pay

## 2020-03-12 ENCOUNTER — Ambulatory Visit: Payer: No Typology Code available for payment source | Admitting: Nurse Practitioner

## 2020-03-13 ENCOUNTER — Emergency Department (HOSPITAL_BASED_OUTPATIENT_CLINIC_OR_DEPARTMENT_OTHER): Payer: No Typology Code available for payment source

## 2020-03-13 ENCOUNTER — Other Ambulatory Visit: Payer: Self-pay

## 2020-03-13 ENCOUNTER — Encounter (HOSPITAL_BASED_OUTPATIENT_CLINIC_OR_DEPARTMENT_OTHER): Payer: Self-pay | Admitting: Emergency Medicine

## 2020-03-13 ENCOUNTER — Emergency Department (HOSPITAL_BASED_OUTPATIENT_CLINIC_OR_DEPARTMENT_OTHER)
Admission: EM | Admit: 2020-03-13 | Discharge: 2020-03-13 | Disposition: A | Discharge status: Home / Self Care | Payer: No Typology Code available for payment source | Attending: Emergency Medicine | Admitting: Emergency Medicine

## 2020-03-13 DIAGNOSIS — Z20822 Contact with and (suspected) exposure to covid-19: Secondary | ICD-10-CM | POA: Diagnosis not present

## 2020-03-13 DIAGNOSIS — J45909 Unspecified asthma, uncomplicated: Secondary | ICD-10-CM | POA: Insufficient documentation

## 2020-03-13 DIAGNOSIS — R0789 Other chest pain: Secondary | ICD-10-CM | POA: Diagnosis not present

## 2020-03-13 DIAGNOSIS — R079 Chest pain, unspecified: Secondary | ICD-10-CM

## 2020-03-13 DIAGNOSIS — R1084 Generalized abdominal pain: Secondary | ICD-10-CM

## 2020-03-13 DIAGNOSIS — R059 Cough, unspecified: Secondary | ICD-10-CM

## 2020-03-13 DIAGNOSIS — R112 Nausea with vomiting, unspecified: Secondary | ICD-10-CM | POA: Diagnosis not present

## 2020-03-13 DIAGNOSIS — B349 Viral infection, unspecified: Secondary | ICD-10-CM | POA: Diagnosis not present

## 2020-03-13 DIAGNOSIS — Z7951 Long term (current) use of inhaled steroids: Secondary | ICD-10-CM | POA: Insufficient documentation

## 2020-03-13 LAB — RESPIRATORY PANEL BY RT PCR (FLU A&B, COVID)
Influenza A by PCR: NEGATIVE
Influenza B by PCR: NEGATIVE
SARS Coronavirus 2 by RT PCR: NEGATIVE

## 2020-03-13 LAB — HEPATIC FUNCTION PANEL
ALT: 17 U/L (ref 0–44)
AST: 20 U/L (ref 15–41)
Albumin: 4.1 g/dL (ref 3.5–5.0)
Alkaline Phosphatase: 65 U/L (ref 38–126)
Bilirubin, Direct: 0.1 mg/dL (ref 0.0–0.2)
Indirect Bilirubin: 0.2 mg/dL — ABNORMAL LOW (ref 0.3–0.9)
Total Bilirubin: 0.3 mg/dL (ref 0.3–1.2)
Total Protein: 8 g/dL (ref 6.5–8.1)

## 2020-03-13 LAB — TROPONIN I (HIGH SENSITIVITY)
Troponin I (High Sensitivity): <2 ng/L (ref <18)
Troponin I (High Sensitivity): <2 ng/L (ref <18)

## 2020-03-13 LAB — CBC
HCT: 41.6 % (ref 36.0–46.0)
Hemoglobin: 13 g/dL (ref 12.0–15.0)
MCH: 30 pg (ref 26.0–34.0)
MCHC: 31.3 g/dL (ref 30.0–36.0)
MCV: 96.1 fL (ref 80.0–100.0)
Platelets: 341 10*3/uL (ref 150–400)
RBC: 4.33 MIL/uL (ref 3.87–5.11)
RDW: 11.4 % — ABNORMAL LOW (ref 11.5–15.5)
WBC: 12.5 10*3/uL — ABNORMAL HIGH (ref 4.0–10.5)
nRBC: 0 % (ref 0.0–0.2)

## 2020-03-13 LAB — URINALYSIS, ROUTINE W REFLEX MICROSCOPIC
Bilirubin Urine: NEGATIVE
Glucose, UA: NEGATIVE mg/dL
Hgb urine dipstick: NEGATIVE
Ketones, ur: NEGATIVE mg/dL
Leukocytes,Ua: NEGATIVE
Nitrite: NEGATIVE
Protein, ur: NEGATIVE mg/dL
Specific Gravity, Urine: 1.02 (ref 1.005–1.030)
pH: 6.5 (ref 5.0–8.0)

## 2020-03-13 LAB — BASIC METABOLIC PANEL
Anion gap: 8 (ref 5–15)
BUN: 14 mg/dL (ref 6–20)
CO2: 24 mmol/L (ref 22–32)
Calcium: 9.5 mg/dL (ref 8.9–10.3)
Chloride: 107 mmol/L (ref 98–111)
Creatinine, Ser: 0.74 mg/dL (ref 0.44–1.00)
GFR, Estimated: >60 mL/min (ref >60)
Glucose, Bld: 92 mg/dL (ref 70–99)
Potassium: 4.1 mmol/L (ref 3.5–5.1)
Sodium: 139 mmol/L (ref 135–145)

## 2020-03-13 LAB — LIPASE, BLOOD: Lipase: 30 U/L (ref 11–51)

## 2020-03-13 LAB — PREGNANCY, URINE: Preg Test, Ur: NEGATIVE

## 2020-03-13 MED ORDER — PANTOPRAZOLE SODIUM 20 MG PO TBEC: 40.0000 mg | DELAYED_RELEASE_TABLET | Freq: Every day | ORAL | 0 refills | Status: DC

## 2020-03-13 MED ORDER — IOHEXOL 350 MG/ML SOLN
100.0000 mL | Freq: Once | INTRAVENOUS | Status: AC | PRN
  Administered 2020-03-13: 100 mL via INTRAVENOUS

## 2020-03-13 MED ORDER — ONDANSETRON 4 MG PO TBDP: 4.0000 mg | ORAL_TABLET | Freq: Three times a day (TID) | ORAL | 0 refills | Status: DC | PRN

## 2020-03-13 NOTE — ED Notes (Signed)
ED Provider at bedside. 

## 2020-03-13 NOTE — ED Notes (Signed)
On hold, waiting for IV access

## 2020-03-13 NOTE — ED Triage Notes (Signed)
Pt reports chest pain, abd pain, SOB and vomited x 1. This started this morning.

## 2020-03-13 NOTE — ED Notes (Signed)
ED Provider at bedside for U/S IV 

## 2020-03-13 NOTE — ED Notes (Signed)
Attempted IV to LAC, x2 , unsuccessful, tol well

## 2020-03-13 NOTE — ED Notes (Signed)
Patient transported to CT 

## 2020-03-13 NOTE — ED Provider Notes (Signed)
[facility] EMERGENCY DEPARTMENT Provider Note   CSN: [id number] Arrival date & time: 03/13/20  8088     History Chief Complaint  Patient presents with  . Chest Pain  . Abdominal Pain    Rebecca Rocha is a 19 y.o. female.  HPI      Hx of cholecystectomy July Abdominal pain began this AM Lowre abdominal pain, epigastric and a little bit to right side 8/10, cramping and sharp pain Nothing makes it better or worse other than walking and sitting up makes it worse Nausea, vomited 3 times No diarrhea or constipation, is passing flatus, earlier today had normal BM Appetite is ok No fevers, chills No dysuria LMP unknown has nexplanon No vaginal discharge Mild cough, shortness of breath also began this AM CP began 7AM, feels like tightness, pressure, nothing makes it better or worse No leg pain or swelling    Past Medical History:  Diagnosis Date  . Acute calculous cholecystitis 11/26/2019  . Asthma   . Menstrual migraine, intractable, without status migrainosus   . Panic attack   . Sinusitis   . Syncope and collapse   . Vertigo of central origin, unspecified ear     Patient Active Problem List   Diagnosis Date Noted  . S/P laparoscopic cholecystectomy 12/08/2019  . Chronic tension-type headache, not intractable 12/08/2019  . 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 Chronic upper back pain 09/18/2019  . Seizure-like activity (HCC) 01/08/2018  . 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 Asthma 10/27/2016    Past Surgical History:  Procedure Laterality Date  . CHOLECYSTECTOMY N/A 11/26/2019   Procedure: LAPAROSCOPIC CHOLECYSTECTOMY;  Surgeon: Rodman Pickle, MD;  Location: [facility]S;  Service: General;  Laterality: N/A;     OB History    Gravida  0   Para  0   Term  0   Preterm  0   AB  0   Living  0     SAB  0   TAB  0   Ectopic  0   Multiple  0   Live Births  0           Family History  Problem Relation Age of  Onset  . Hypertension Mother   . Liver disease Mother   . Other Mother        GENETIC (INHERITED) DISORDER  . Cancer - Other Mother        breast mass-possible cancer?  Marland Kitchen Hypertension Father   . Diabetes Father   . Cancer Paternal Grandmother   . Diabetes Paternal Grandmother   . Hypertension Paternal Grandmother   . High Cholesterol Paternal Grandmother   . Cancer Paternal Grandfather   . Diabetes Paternal Grandfather   . Hypertension Paternal Grandfather   . High Cholesterol Paternal Grandfather   . Anxiety disorder Sister   . Cancer Maternal Aunt     Social History   Tobacco Use  . Smoking status: Never Smoker  . Smokeless tobacco: Never Used  Vaping Use  . Vaping Use: Never used  Substance Use Topics  . Alcohol use: Yes    Comment: RARE  . Drug use: No    Home Medications Prior to Admission medications   Medication Sig Start Date End Date Taking? Authorizing Provider  acetaminophen (TYLENOL) 500 MG tablet Take 1-2 tablets (500-1,000 mg total) by mouth every 6 (six) hours as needed for mild pain or moderate pain. 11/27/19 11/26/20  Adam Phenix, PA-C  albuterol (VENTOLIN HFA) 108 (90 Base)  MCG/ACT inhaler Inhale 1-2 puffs into the lungs every 6 (six) hours as needed for wheezing or shortness of breath. Need office visit for additional refill 02/23/20   Nche, Bonna Gains, NP  fluticasone (FLOVENT HFA) 110 MCG/ACT inhaler Inhale 1 puff into the lungs 2 (two) times daily. Rinse mouth after each use 03/01/20   Nche, Bonna Gains, NP  ondansetron (ZOFRAN ODT) 4 MG disintegrating tablet Take 1 tablet (4 mg total) by mouth every 8 (eight) hours as needed for nausea or vomiting. 03/13/20   Alvira Monday, MD  pantoprazole (PROTONIX) 20 MG tablet Take 2 tablets (40 mg total) by mouth daily for 10 days. 03/13/20 03/23/20  Alvira Monday, MD  rizatriptan (MAXALT) 10 MG tablet Take 1 tablet (10 mg total) by mouth as needed for migraine. Every 2hrs as needed. No more than  30mg  in 24hrs. Need office visit for additional refills 02/26/20   Nche, 02/28/20, NP  traZODone (DESYREL) 50 MG tablet Take 0.5-1 tablets (25-50 mg total) by mouth at bedtime as needed for sleep. 02/27/20   Nche, 02/29/20, NP    Allergies    Patient has no known allergies.  Review of Systems   Review of Systems  Constitutional: Negative for appetite change and fever.  HENT: Positive for sore throat.   Eyes: Negative for visual disturbance.  Respiratory: Positive for cough and shortness of breath.   Cardiovascular: Positive for chest pain. Negative for leg swelling.  Gastrointestinal: Positive for abdominal pain, nausea and vomiting. Negative for constipation and diarrhea.  Genitourinary: Negative for difficulty urinating, dysuria, vaginal bleeding and vaginal discharge.  Musculoskeletal: Negative for back pain and neck pain.  Skin: Negative for rash.  Neurological: Positive for headaches. Negative for syncope.    Physical Exam Updated Vital Signs BP 114/65   Pulse 61   Temp 98.3 F (36.8 C) (Oral)   Resp (!) 26   Ht 5\' 1"  (1.549 m)   Wt 64.4 kg   SpO2 97%   BMI 26.83 kg/m   Physical Exam Vitals and nursing note reviewed.  Constitutional:      General: She is not in acute distress.    Appearance: She is well-developed. She is not diaphoretic.  HENT:     Head: Normocephalic and atraumatic.  Eyes:     Conjunctiva/sclera: Conjunctivae normal.  Cardiovascular:     Rate and Rhythm: Normal rate and regular rhythm.     Heart sounds: Normal heart sounds. No murmur heard.  No friction rub. No gallop.   Pulmonary:     Effort: Pulmonary effort is normal. No respiratory distress.     Breath sounds: Normal breath sounds. No wheezing or rales.  Abdominal:     General: There is no distension.     Palpations: Abdomen is soft.     Tenderness: There is abdominal tenderness (diffuse). There is no guarding.  Musculoskeletal:        General: No tenderness.     Cervical  back: Normal range of motion.  Skin:    General: Skin is warm and dry.     Findings: No erythema or rash.  Neurological:     Mental Status: She is alert and oriented to person, place, and time.     ED Results / Procedures / Treatments   Labs (all labs ordered are listed, but only abnormal results are displayed) Labs Reviewed  CBC - Abnormal; Notable for the following components:      Result Value   WBC 12.5 (*)  RDW 11.4 (*)    All other components within normal limits  HEPATIC FUNCTION PANEL - Abnormal; Notable for the following components:   Indirect Bilirubin 0.2 (*)    All other components within normal limits  RESPIRATORY PANEL BY RT PCR (FLU A&B, COVID)  URINE CULTURE  BASIC METABOLIC PANEL  PREGNANCY, URINE  LIPASE, BLOOD  URINALYSIS, ROUTINE W REFLEX MICROSCOPIC  TROPONIN I (HIGH SENSITIVITY)  TROPONIN I (HIGH SENSITIVITY)    EKG EKG Interpretation  Date/Time:  Saturday March 13 2020 08:24:35 EDT Ventricular Rate:  74 PR Interval:    QRS Duration: 91 QT Interval:  345 QTC Calculation: 383 R Axis:   75 Text Interpretation: Sinus rhythm Low voltage, precordial leads No significant change since last tracing Confirmed by Alvira MondaySchlossman, Olita Takeshita ([id number]) on 03/13/2020 10:13:32 AM   Radiology CT Angio Chest PE W and/or Wo Contrast  Result Date: 03/13/2020 CLINICAL DATA:  Shortness of breath and nausea and vomiting, concern for pulmonary embolism and bowel obstruction. EXAM: CT ANGIOGRAPHY CHEST CT ABDOMEN AND PELVIS WITH CONTRAST TECHNIQUE: Multidetector CT imaging of the chest was performed using the standard protocol during bolus administration of intravenous contrast. Multiplanar CT image reconstructions and MIPs were obtained to evaluate the vascular anatomy. Multidetector CT imaging of the abdomen and pelvis was performed using the standard protocol during bolus administration of intravenous contrast. CONTRAST:  100mL OMNIPAQUE IOHEXOL 350 MG/ML SOLN COMPARISON:  CT  abdomen pelvis dated 11/25/2019 and chest radiograph dated 03/13/2020. FINDINGS: CTA CHEST FINDINGS Cardiovascular: Satisfactory opacification of the pulmonary arteries to the segmental level. No evidence of pulmonary embolism. Normal heart size. No pericardial effusion. Mediastinum/Nodes: No enlarged mediastinal, hilar, or axillary lymph nodes. Thyroid gland, trachea, and esophagus demonstrate no significant findings. Lungs/Pleura: Lungs are clear. No pleural effusion or pneumothorax. Musculoskeletal: No chest wall abnormality. No acute or significant osseous findings. Review of the MIP images confirms the above findings. CT ABDOMEN and PELVIS FINDINGS Hepatobiliary: A 7 mm hypoattenuating lesion in hepatic segment 5 (series 13, image 24) is noted. Status post cholecystectomy. No biliary dilatation. Pancreas: Unremarkable. No pancreatic ductal dilatation or surrounding inflammatory changes. Spleen: Normal in size without focal abnormality. Adrenals/Urinary Tract: Adrenal glands are unremarkable. Kidneys are normal, without renal calculi, focal lesion, or hydronephrosis. Bladder is unremarkable. Stomach/Bowel: Stomach is within normal limits. Appendix appears normal. No evidence of bowel wall thickening, distention, or inflammatory changes. Vascular/Lymphatic: No significant vascular findings are present. No enlarged abdominal or pelvic lymph nodes. Reproductive: Uterus and bilateral adnexa are unremarkable. Other: No abdominal wall hernia or abnormality. No abdominopelvic ascites. Musculoskeletal: No acute or significant osseous findings. Review of the MIP images confirms the above findings. IMPRESSION: 1. No evidence of pulmonary embolism. 2. No acute findings in the chest, abdomen or pelvis. 3. A 7 mm hypoattenuating lesion in hepatic segment 5 is likely benign if the patient has no known malignancy or liver disease. If the patient has a known malignancy or liver disease, follow-up MRI in 3-6 months would be  recommended. This recommendation follows ACR consensus guidelines: Management of Incidental Liver Lesions on CT: A White Paper of the ACR Incidental Findings Committee. J Am Coll Radiol 2017; 60:4540-9811; 14:1429-1437. Electronically Signed   By: Romona Curlsyler  Litton M.D.   On: 03/13/2020 12:30   CT ABDOMEN PELVIS W CONTRAST  Result Date: 03/13/2020 CLINICAL DATA:  Shortness of breath and nausea and vomiting, concern for pulmonary embolism and bowel obstruction. EXAM: CT ANGIOGRAPHY CHEST CT ABDOMEN AND PELVIS WITH CONTRAST TECHNIQUE: Multidetector CT imaging of the  chest was performed using the standard protocol during bolus administration of intravenous contrast. Multiplanar CT image reconstructions and MIPs were obtained to evaluate the vascular anatomy. Multidetector CT imaging of the abdomen and pelvis was performed using the standard protocol during bolus administration of intravenous contrast. CONTRAST:  OMNIPAQUE IOHEXOL 350 MG/ML SOLN COMPARISON:  CT abdomen pelvis dated 11/25/2019 and chest radiograph dated 03/13/2020. FINDINGS: CTA CHEST FINDINGS Cardiovascular: Satisfactory opacification of the pulmonary arteries to the segmental level. No evidence of pulmonary embolism. Normal heart size. No pericardial effusion. Mediastinum/Nodes: No enlarged mediastinal, hilar, or axillary lymph nodes. Thyroid gland, trachea, and esophagus demonstrate no significant findings. Lungs/Pleura: Lungs are clear. No pleural effusion or pneumothorax. Musculoskeletal: No chest wall abnormality. No acute or significant osseous findings. Review of the MIP images confirms the above findings. CT ABDOMEN and PELVIS FINDINGS Hepatobiliary: A 7 mm hypoattenuating lesion in hepatic segment 5 (series 13, image 24) is noted. Status post cholecystectomy. No biliary dilatation. Pancreas: Unremarkable. No pancreatic ductal dilatation or surrounding inflammatory changes. Spleen: Normal in size without focal abnormality. Adrenals/Urinary Tract:  Adrenal glands are unremarkable. Kidneys are normal, without renal calculi, focal lesion, or hydronephrosis. Bladder is unremarkable. Stomach/Bowel: Stomach is within normal limits. Appendix appears normal. No evidence of bowel wall thickening, distention, or inflammatory changes. Vascular/Lymphatic: No significant vascular findings are present. No enlarged abdominal or pelvic lymph nodes. Reproductive: Uterus and bilateral adnexa are unremarkable. Other: No abdominal wall hernia or abnormality. No abdominopelvic ascites. Musculoskeletal: No acute or significant osseous findings. Review of the MIP images confirms the above findings. IMPRESSION: 1. No evidence of pulmonary embolism. 2. No acute findings in the chest, abdomen or pelvis. 3. A 7 mm hypoattenuating lesion in hepatic segment 5 is likely benign if the patient has no known malignancy or liver disease. If the patient has a known malignancy or liver disease, follow-up MRI in 3-6 months would be recommended. This recommendation follows ACR consensus guidelines: Management of Incidental Liver Lesions on CT: A White Paper of the ACR Incidental Findings Committee. J Am Coll Radiol 2017; 36:4680-3212. Electronically Signed   By: Romona Curls M.D.   On: 03/13/2020 12:30   DG Chest Portable 1 View  Result Date: 03/13/2020 CLINICAL DATA:  Shortness of breath EXAM: PORTABLE CHEST 1 VIEW COMPARISON:  February 27, 2020 FINDINGS: The heart size and mediastinal contours are within normal limits. Both lungs are clear. The visualized skeletal structures are unremarkable. IMPRESSION: No active disease. Electronically Signed   By: Gerome Sam III M.D   On: 03/13/2020 09:27    Procedures Procedures (including critical care time)  Medications Ordered in ED Medications  iohexol (OMNIPAQUE) 350 MG/ML injection 100 mL (100 mLs Intravenous Contrast Given 03/13/20 1142)    ED Course  I have reviewed the triage vital signs and the nursing notes.  Pertinent labs  & imaging results that were available during my care of the patient were reviewed by me and considered in my medical decision making (see chart for details).    MDM Rules/Calculators/A&P                          19 year old female with history of cholecystectomy in July who presents with concern for abdominal pain, nausea, vomiting, chest pain, dyspnea, mild cough.  Given history of chest pain with dyspnea, surgery, on estrogen, CT PE study was done which showed no evidence of pulmonary embolus, pneumonia, or other abnormalities.  Troponins are negative x2, with no  acute findings on EKG and have low suspicion for ACS.  Regarding abdominal pain, pregnancy and urinalysis without acute abnormalities.  Labs show no evidence of pancreatitis or hepatitis.  CT abdomen pelvis was done to evaluate for signs of small bowel obstruction given nausea, vomiting, diffuse abdominal pain and history of surgery, and showed no evidence of acute abnormalities.  Discussed hepatic lesion as incidental finding.  Recommend follow-up with primary care physician, continue supportive care for likely viral illness.  Covid 19 and flu testing are negative.  Given prescription for Zofran, and PPI for likely gastritis. Patient discharged in stable condition with understanding of reasons to return.   Final Clinical Impression(s) / ED Diagnoses Final diagnoses:  Generalized abdominal pain  Non-intractable vomiting with nausea, unspecified vomiting type  Cough  Chest pain, unspecified type  Viral illness    Rx / DC Orders ED Discharge Orders         Ordered    pantoprazole (PROTONIX) 20 MG tablet  Daily        03/13/20 1254    ondansetron (ZOFRAN ODT) 4 MG disintegrating tablet  Every 8 hours PRN        03/13/20 1254           Alvira Monday, MD 03/13/20 1259

## 2020-03-14 LAB — URINE CULTURE: Culture: <10000 — AB

## 2020-03-19 ENCOUNTER — Other Ambulatory Visit: Payer: Self-pay | Admitting: Nurse Practitioner

## 2020-03-19 DIAGNOSIS — J4521 Mild intermittent asthma with (acute) exacerbation: Secondary | ICD-10-CM

## 2020-03-22 NOTE — Telephone Encounter (Signed)
Last ov 02/27/20 Last fill 02/23/20  #6.7/0

## 2020-04-02 ENCOUNTER — Encounter: Payer: Self-pay | Admitting: Nurse Practitioner

## 2020-04-02 ENCOUNTER — Telehealth: Payer: Self-pay | Admitting: Nurse Practitioner

## 2020-04-02 NOTE — Telephone Encounter (Signed)
Pt was no show for appt 03/12/2020 for acute. 1st occurrence. Pt has upcoming appt. Letter mailed.

## 2020-04-06 ENCOUNTER — Other Ambulatory Visit: Payer: Self-pay

## 2020-04-07 ENCOUNTER — Ambulatory Visit (INDEPENDENT_AMBULATORY_CARE_PROVIDER_SITE_OTHER): Payer: No Typology Code available for payment source | Admitting: Nurse Practitioner

## 2020-04-07 ENCOUNTER — Encounter: Payer: Self-pay | Admitting: Nurse Practitioner

## 2020-04-07 VITALS — BP 110/74 | HR 99 | Temp 96.7°F | Ht 61.0 in | Wt 141.8 lb

## 2020-04-07 DIAGNOSIS — G4700 Insomnia, unspecified: Secondary | ICD-10-CM

## 2020-04-07 DIAGNOSIS — K769 Liver disease, unspecified: Secondary | ICD-10-CM | POA: Insufficient documentation

## 2020-04-07 MED ORDER — TEMAZEPAM 7.5 MG PO CAPS: 7.5000 mg | ORAL_CAPSULE | Freq: Every evening | ORAL | 2 refills | Status: DC | PRN

## 2020-04-07 NOTE — Patient Instructions (Addendum)
You will be contacted to schedule an appt for repeat liver MRI in 60months.  Stop trazodone Use restoril prn for sleep F/up in 64month of sooner if needed.

## 2020-04-07 NOTE — Progress Notes (Signed)
Subjective:  Patient ID: Rebecca Rocha, female    DOB: 2000/06/26  Age: 19 y.o. MRN: [id number]  CC: Follow-up (Pt states medication prescribed for insomnia gives her migraines and is not helping. Pt also would like a referral to a liver doctor due to recently finding a benign mass in her belly during a recent hospital visit x3 weeks ago. )  HPI  Insomnia Copied from previous office visit 02/27/2020:fragmented sleep, sleep disturbance, no somnolence, frequent awakening, premature morning awakening, malaise/fatigue, no napping.  Typical bedtime:  8-10 P.M.and it takes 15-89mins to fall asleep. Sleep duration: 3-4hrs. started more than one month and occurs nightly.The symptoms are aggravated by anxiety and bed partner.  No ETOH, no caffeine, no nicotine use Past treatments include meditation (10mg  melatonin and nyquil).  developed headache with trazodone 25mg . And did not help with sleep duration. D/c trazodone Start restoril at hs prn.  Hepatic lesion 27mm lesion, incidental finding during recent ED visit, reviewed ED note, lab results and radiology report. No nausea, resolved ABD pain with zofran and PPI, no jaundice, no weight loss MRI ABD order for additional eval  Depression screen [facility] 2/9 04/07/2020 09/18/2019  Decreased Interest 2 3  Down, Depressed, Hopeless 1 1  PHQ - 2 Score 3 4  Altered sleeping 3 -  Tired, decreased energy 2 -  Change in appetite 2 -  Feeling bad or failure about yourself  0 -  Trouble concentrating 1 -  Moving slowly or fidgety/restless 2 -  Suicidal thoughts 0 -  PHQ-9 Score 13 -  Difficult doing work/chores Somewhat difficult -   GAD 7 : Generalized Anxiety Score 04/07/2020 04/07/2020  Nervous, Anxious, on Edge 0 1  Control/stop worrying 0 2  Worry too much - different things 0 2  Trouble relaxing 0 2  Restless 0 2  Easily annoyed or irritable 0 2  Afraid - awful might happen 0 0  Total GAD 7 Score 0 11  Anxiety Difficulty Not  difficult at all Very difficult   Reviewed past Medical, Social and Family history today.  Outpatient Medications Prior to Visit  Medication Sig Dispense Refill   acetaminophen (TYLENOL) 500 MG tablet Take 1-2 tablets (500-1,000 mg total) by mouth every 6 (six) hours as needed for mild pain or moderate pain. 100 tablet 2   albuterol (VENTOLIN HFA) 108 (90 Base) MCG/ACT inhaler 1-2 PUFFS EVERY 6 HOURS FOR WHEEZING OR SHORTNESS OF BREATH. NEED OFFICE VISIT FOR ADDITIONAL REFILL 6.7 each 1   fluticasone (FLOVENT HFA) 110 MCG/ACT inhaler Inhale 1 puff into the lungs 2 (two) times daily. Rinse mouth after each use 1 each 2   ondansetron (ZOFRAN ODT) 4 MG disintegrating tablet Take 1 tablet (4 mg total) by mouth every 8 (eight) hours as needed for nausea or vomiting. 20 tablet 0   rizatriptan (MAXALT) 10 MG tablet Take 1 tablet (10 mg total) by mouth as needed for migraine. Every 2hrs as needed. No more than 30mg  in 24hrs. Need office visit for additional refills 9 tablet 0   traZODone (DESYREL) 50 MG tablet Take 0.5-1 tablets (25-50 mg total) by mouth at bedtime as needed for sleep. 30 tablet 2   pantoprazole (PROTONIX) 20 MG tablet Take 2 tablets (40 mg total) by mouth daily for 10 days. 20 tablet 0   No facility-administered medications prior to visit.    ROS See HPI  Objective:  BP 110/74 (BP Location: Left Arm, Patient Position: Sitting, Cuff Size: Normal)    Pulse  99    Temp (!) 96.7 F (35.9 C) (Temporal)    Ht 5\' 1"  (1.549 m)    Wt 141 lb 12.8 oz (64.3 kg)    SpO2 99%    BMI 26.79 kg/m   Physical Exam Cardiovascular:     Rate and Rhythm: Normal rate.     Pulses: Normal pulses.  Abdominal:     General: There is no distension.  Neurological:     Mental Status: She is alert and oriented to person, place, and time.  Psychiatric:        Mood and Affect: Mood normal.        Behavior: Behavior normal.        Thought Content: Thought content normal.    Assessment & Plan:    This visit occurred during the SARS-CoV-2 public health emergency.  Safety protocols were in place, including screening questions prior to the visit, additional usage of staff PPE, and extensive cleaning of exam room while observing appropriate contact time as indicated for disinfecting solutions.   Linsey was seen today for follow-up.  Diagnoses and all orders for this visit:  Insomnia, unspecified type -     temazepam (RESTORIL) 7.5 MG capsule; Take 1 capsule (7.5 mg total) by mouth at bedtime as needed for sleep.  Hepatic lesion -     MR LIVER W WO CONTRAST; Future   Problem List Items Addressed This Visit      Other   Hepatic lesion    69mm lesion, incidental finding during recent ED visit, reviewed ED note, lab results and radiology report. No nausea, resolved ABD pain with zofran and PPI, no jaundice, no weight loss MRI ABD order for additional eval      Relevant Orders   MR LIVER W WO CONTRAST   Insomnia - Primary    Copied from previous office visit 02/27/2020:fragmented sleep, sleep disturbance, no somnolence, frequent awakening, premature morning awakening, malaise/fatigue, no napping.  Typical bedtime:  8-10 P.M.and it takes 15-57mins to fall asleep. Sleep duration: 3-4hrs. started more than one month and occurs nightly.The symptoms are aggravated by anxiety and bed partner.  No ETOH, no caffeine, no nicotine use Past treatments include meditation (10mg  melatonin and nyquil).  developed headache with trazodone 25mg . And did not help with sleep duration. D/c trazodone Start restoril at hs prn.      Relevant Medications   temazepam (RESTORIL) 7.5 MG capsule      Follow-up: Return in about 3 months (around 07/08/2020) for insomnia.  , NP

## 2020-04-07 NOTE — Assessment & Plan Note (Addendum)
74mm lesion, incidental finding during recent ED visit, reviewed ED note, lab results and radiology report. No nausea, resolved ABD pain with zofran and PPI, no jaundice, no weight loss MRI ABD order for additional eval

## 2020-04-07 NOTE — Assessment & Plan Note (Signed)
Copied from previous office visit 02/27/2020:fragmented sleep, sleep disturbance, no somnolence, frequent awakening, premature morning awakening, malaise/fatigue, no napping.  Typical bedtime:  8-10 P.M.and it takes 15-70mins to fall asleep. Sleep duration: 3-4hrs. started more than one month and occurs nightly.The symptoms are aggravated by anxiety and bed partner.  No ETOH, no caffeine, no nicotine use Past treatments include meditation (10mg  melatonin and nyquil).  developed headache with trazodone 25mg . And did not help with sleep duration. D/c trazodone Start restoril at hs prn.

## 2020-04-12 ENCOUNTER — Emergency Department (HOSPITAL_BASED_OUTPATIENT_CLINIC_OR_DEPARTMENT_OTHER): Payer: No Typology Code available for payment source

## 2020-04-12 ENCOUNTER — Encounter (HOSPITAL_BASED_OUTPATIENT_CLINIC_OR_DEPARTMENT_OTHER): Payer: Self-pay

## 2020-04-12 ENCOUNTER — Other Ambulatory Visit: Payer: Self-pay

## 2020-04-12 ENCOUNTER — Emergency Department (HOSPITAL_BASED_OUTPATIENT_CLINIC_OR_DEPARTMENT_OTHER)
Admission: EM | Admit: 2020-04-12 | Discharge: 2020-04-12 | Disposition: A | Discharge status: Home / Self Care | Payer: No Typology Code available for payment source | Attending: Emergency Medicine | Admitting: Emergency Medicine

## 2020-04-12 DIAGNOSIS — J45909 Unspecified asthma, uncomplicated: Secondary | ICD-10-CM | POA: Diagnosis not present

## 2020-04-12 DIAGNOSIS — R1084 Generalized abdominal pain: Secondary | ICD-10-CM | POA: Diagnosis not present

## 2020-04-12 DIAGNOSIS — Z7951 Long term (current) use of inhaled steroids: Secondary | ICD-10-CM | POA: Diagnosis not present

## 2020-04-12 DIAGNOSIS — R110 Nausea: Secondary | ICD-10-CM | POA: Diagnosis not present

## 2020-04-12 DIAGNOSIS — R1013 Epigastric pain: Secondary | ICD-10-CM | POA: Diagnosis present

## 2020-04-12 LAB — URINALYSIS, ROUTINE W REFLEX MICROSCOPIC
Bilirubin Urine: NEGATIVE
Glucose, UA: NEGATIVE mg/dL
Ketones, ur: NEGATIVE mg/dL
Nitrite: NEGATIVE
Protein, ur: NEGATIVE mg/dL
Specific Gravity, Urine: >1.03 — ABNORMAL HIGH (ref 1.005–1.030)
pH: 6 (ref 5.0–8.0)

## 2020-04-12 LAB — COMPREHENSIVE METABOLIC PANEL
ALT: 14 U/L (ref 0–44)
AST: 17 U/L (ref 15–41)
Albumin: 3.8 g/dL (ref 3.5–5.0)
Alkaline Phosphatase: 55 U/L (ref 38–126)
Anion gap: 8 (ref 5–15)
BUN: 10 mg/dL (ref 6–20)
CO2: 23 mmol/L (ref 22–32)
Calcium: 8.9 mg/dL (ref 8.9–10.3)
Chloride: 105 mmol/L (ref 98–111)
Creatinine, Ser: 0.62 mg/dL (ref 0.44–1.00)
GFR, Estimated: >60 mL/min (ref >60)
Glucose, Bld: 96 mg/dL (ref 70–99)
Potassium: 3.6 mmol/L (ref 3.5–5.1)
Sodium: 136 mmol/L (ref 135–145)
Total Bilirubin: 0.6 mg/dL (ref 0.3–1.2)
Total Protein: 8 g/dL (ref 6.5–8.1)

## 2020-04-12 LAB — URINALYSIS, MICROSCOPIC (REFLEX)

## 2020-04-12 LAB — CBC
HCT: 42 % (ref 36.0–46.0)
Hemoglobin: 13.3 g/dL (ref 12.0–15.0)
MCH: 30.2 pg (ref 26.0–34.0)
MCHC: 31.7 g/dL (ref 30.0–36.0)
MCV: 95.2 fL (ref 80.0–100.0)
Platelets: 323 10*3/uL (ref 150–400)
RBC: 4.41 MIL/uL (ref 3.87–5.11)
RDW: 11.4 % — ABNORMAL LOW (ref 11.5–15.5)
WBC: 7.8 10*3/uL (ref 4.0–10.5)
nRBC: 0 % (ref 0.0–0.2)

## 2020-04-12 LAB — PREGNANCY, URINE: Preg Test, Ur: NEGATIVE

## 2020-04-12 LAB — LIPASE, BLOOD: Lipase: 28 U/L (ref 11–51)

## 2020-04-12 MED ORDER — ONDANSETRON HCL 4 MG/2ML IJ SOLN
4.0000 mg | Freq: Once | INTRAMUSCULAR | Status: AC
  Administered 2020-04-12: 4 mg via INTRAVENOUS
  Filled 2020-04-12: qty 2

## 2020-04-12 MED ORDER — SODIUM CHLORIDE 0.9 % IV SOLN: INTRAVENOUS | Status: DC

## 2020-04-12 MED ORDER — IOHEXOL 300 MG/ML  SOLN
100.0000 mL | Freq: Once | INTRAMUSCULAR | Status: AC | PRN
  Administered 2020-04-12: 100 mL via INTRAVENOUS

## 2020-04-12 MED ORDER — SODIUM CHLORIDE 0.9 % IV BOLUS
1000.0000 mL | Freq: Once | INTRAVENOUS | Status: AC
  Administered 2020-04-12: 1000 mL via INTRAVENOUS

## 2020-04-12 MED ORDER — HYDROMORPHONE HCL 1 MG/ML IJ SOLN
0.5000 mg | Freq: Once | INTRAMUSCULAR | Status: AC
  Administered 2020-04-12: 0.5 mg via INTRAVENOUS
  Filled 2020-04-12: qty 1

## 2020-04-12 MED ORDER — HYDROCODONE-ACETAMINOPHEN 5-325 MG PO TABS
1.0000 | ORAL_TABLET | Freq: Four times a day (QID) | ORAL | 0 refills | Status: DC | PRN
Start: 2020-04-12 — End: 2020-04-20

## 2020-04-12 MED ORDER — ONDANSETRON 4 MG PO TBDP: 4.0000 mg | ORAL_TABLET | Freq: Three times a day (TID) | ORAL | 1 refills | Status: DC | PRN

## 2020-04-12 NOTE — ED Notes (Signed)
Pt given urine cup, unable to void at this time.  

## 2020-04-12 NOTE — ED Triage Notes (Signed)
Pt arrives with c/o nausea and lower abdominal pain since Friday, reports taking Zofran at home for nausea.

## 2020-04-12 NOTE — Discharge Instructions (Addendum)
Continue the Zofran for the nausea.  Make an appointment follow-up with GI medicine.  Take the hydrocodone as needed for additional pain.  Also follow-up with your regular doctor.  Return for any new or worse symptoms.  CT scan did show some fluid in the pelvis which would be consistent with a ruptured cyst.  Based on your labs no signs of any significant infection.  If you start developing fevers or worse abdominal pain reevaluation with ultrasound would be appropriate.

## 2020-04-12 NOTE — ED Provider Notes (Addendum)
[facility] EMERGENCY DEPARTMENT Provider Note   CSN: [id number] Arrival date & time: 04/12/20  3235     History Chief Complaint  Patient presents with  . Abdominal Pain    Rebecca Rocha is a 19 y.o. female.  Patient seen October 30 for similar symptoms.  Patient presenting with a complaint of epigastric abdominal pain bilateral lower abdominal pain since Friday.  Taking Zofran at home for nausea.  There is been no vomiting.  No blood in her bowel movements.  Patient evaluated with CAT scan October 30 without any acute findings.  Patient had similar symptoms leading up to her gallbladder removal for a calculus cholecystitis in July.  So this may be a continuation of those symptoms.  Patient without any dysuria no vaginal discharge.        Past Medical History:  Diagnosis Date  . Acute calculous cholecystitis 11/26/2019  . Asthma   . Menstrual migraine, intractable, without status migrainosus   . Panic attack   . Sinusitis   . Syncope and collapse   . Vertigo of central origin, unspecified ear     Patient Active Problem List   Diagnosis Date Noted  . Insomnia 04/07/2020  . Hepatic lesion 04/07/2020  . S/P laparoscopic cholecystectomy 12/08/2019  . Chronic tension-type headache, not intractable 12/08/2019  . 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 Chronic upper back pain 09/18/2019  . Seizure-like activity (HCC) 01/08/2018  . 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 Asthma 10/27/2016    Past Surgical History:  Procedure Laterality Date  . CHOLECYSTECTOMY N/A 11/26/2019   Procedure: LAPAROSCOPIC CHOLECYSTECTOMY;  Surgeon: Rodman Pickle, MD;  Location: [facility];  Service: General;  Laterality: N/A;     OB History    Gravida  0   Para  0   Term  0   Preterm  0   AB  0   Living  0     SAB  0   TAB  0   Ectopic  0   Multiple  0   Live Births  0           Family History  Problem Relation Age of Onset  . Hypertension  Mother   . Liver disease Mother   . Other Mother        GENETIC (INHERITED) DISORDER  . Cancer - Other Mother        breast mass-possible cancer?  Marland Kitchen Hypertension Father   . Diabetes Father   . Cancer Paternal Grandmother   . Diabetes Paternal Grandmother   . Hypertension Paternal Grandmother   . High Cholesterol Paternal Grandmother   . Cancer Paternal Grandfather   . Diabetes Paternal Grandfather   . Hypertension Paternal Grandfather   . High Cholesterol Paternal Grandfather   . Anxiety disorder Sister   . Cancer Maternal Aunt     Social History   Tobacco Use  . Smoking status: Never Smoker  . Smokeless tobacco: Never Used  Vaping Use  . Vaping Use: Never used  Substance Use Topics  . Alcohol use: Yes    Comment: RARE  . Drug use: No    Home Medications Prior to Admission medications   Medication Sig Start Date End Date Taking? Authorizing Provider  acetaminophen (TYLENOL) 500 MG tablet Take 1-2 tablets (500-1,000 mg total) by mouth every 6 (six) hours as needed for mild pain or moderate pain. 11/27/19 11/26/20  Adam Phenix, PA-C  albuterol (VENTOLIN HFA) 108 (90 Base) MCG/ACT inhaler 1-2 PUFFS EVERY  6 HOURS FOR WHEEZING OR SHORTNESS OF BREATH. NEED OFFICE VISIT FOR ADDITIONAL REFILL 03/22/20   Nche, Bonna Gains, NP  fluticasone (FLOVENT HFA) 110 MCG/ACT inhaler Inhale 1 puff into the lungs 2 (two) times daily. Rinse mouth after each use 03/01/20   Nche, Bonna Gains, NP  ondansetron (ZOFRAN ODT) 4 MG disintegrating tablet Take 1 tablet (4 mg total) by mouth every 8 (eight) hours as needed for nausea or vomiting. 03/13/20   Alvira Monday, MD  pantoprazole (PROTONIX) 20 MG tablet Take 2 tablets (40 mg total) by mouth daily for 10 days. 03/13/20 03/23/20  Alvira Monday, MD  rizatriptan (MAXALT) 10 MG tablet Take 1 tablet (10 mg total) by mouth as needed for migraine. Every 2hrs as needed. No more than 30mg  in 24hrs. Need office visit for additional refills  02/26/20   Nche, 02/28/20, NP  temazepam (RESTORIL) 7.5 MG capsule Take 1 capsule (7.5 mg total) by mouth at bedtime as needed for sleep. 04/07/20   Nche, 04/09/20, NP    Allergies    Patient has no known allergies.  Review of Systems   Review of Systems  Constitutional: Negative for chills and fever.  HENT: Negative for congestion, rhinorrhea and sore throat.   Eyes: Negative for visual disturbance.  Respiratory: Negative for cough and shortness of breath.   Cardiovascular: Negative for chest pain and leg swelling.  Gastrointestinal: Positive for abdominal pain and nausea. Negative for blood in stool, diarrhea and vomiting.  Genitourinary: Negative for dysuria.  Musculoskeletal: Negative for back pain and neck pain.  Skin: Negative for rash.  Neurological: Negative for dizziness, light-headedness and headaches.  Hematological: Does not bruise/bleed easily.  Psychiatric/Behavioral: Negative for confusion.    Physical Exam Updated Vital Signs BP 97/84 (BP Location: Right Arm)   Pulse 64   Temp 98.5 F (36.9 C) (Oral)   Resp 16   Ht 1.549 m (5\' 1" )   Wt 65.1 kg   LMP 03/21/2020   SpO2 100%   BMI 27.11 kg/m   Physical Exam Vitals and nursing note reviewed.  Constitutional:      General: She is not in acute distress.    Appearance: Normal appearance. She is well-developed.  HENT:     Head: Normocephalic and atraumatic.  Eyes:     Extraocular Movements: Extraocular movements intact.     Conjunctiva/sclera: Conjunctivae normal.     Pupils: Pupils are equal, round, and reactive to light.  Cardiovascular:     Rate and Rhythm: Normal rate and regular rhythm.     Heart sounds: No murmur heard.   Pulmonary:     Effort: Pulmonary effort is normal. No respiratory distress.     Breath sounds: Normal breath sounds.  Abdominal:     Palpations: Abdomen is soft.     Tenderness: There is no abdominal tenderness. There is no guarding.     Comments: Abdomen is very soft  and nontender  Musculoskeletal:        General: No swelling. Normal range of motion.     Cervical back: Neck supple.  Skin:    General: Skin is warm and dry.     Capillary Refill: Capillary refill takes less than 2 seconds.  Neurological:     General: No focal deficit present.     Mental Status: She is alert and oriented to person, place, and time.     Cranial Nerves: No cranial nerve deficit.     Sensory: No sensory deficit.  Motor: No weakness.     ED Results / Procedures / Treatments   Labs (all labs ordered are listed, but only abnormal results are displayed) Labs Reviewed  CBC - Abnormal; Notable for the following components:      Result Value   RDW 11.4 (*)    All other components within normal limits  URINALYSIS, ROUTINE W REFLEX MICROSCOPIC - Abnormal; Notable for the following components:   APPearance CLOUDY (*)    Specific Gravity, Urine >1.030 (*)    Hgb urine dipstick LARGE (*)    Leukocytes,Ua TRACE (*)    All other components within normal limits  URINALYSIS, MICROSCOPIC (REFLEX) - Abnormal; Notable for the following components:   Bacteria, UA MANY (*)    All other components within normal limits  URINE CULTURE  LIPASE, BLOOD  COMPREHENSIVE METABOLIC PANEL  PREGNANCY, URINE    EKG None  Radiology No results found.  Procedures Procedures (including critical care time)  Medications Ordered in ED Medications  0.9 %  sodium chloride infusion (has no administration in time range)  sodium chloride 0.9 % bolus 1,000 mL (1,000 mLs Intravenous New Bag/Given 04/12/20 1429)  ondansetron (ZOFRAN) injection 4 mg (4 mg Intravenous Given 04/12/20 1429)  HYDROmorphone (DILAUDID) injection 0.5 mg (0.5 mg Intravenous Given 04/12/20 1429)  iohexol (OMNIPAQUE) 300 MG/ML solution 100 mL (100 mLs Intravenous Contrast Given 04/12/20 1503)    ED Course  I have reviewed the triage vital signs and the nursing notes.  Pertinent labs & imaging results that were  available during my care of the patient were reviewed by me and considered in my medical decision making (see chart for details).    MDM Rules/Calculators/A&P                          Epigastric and lower quadrant abdominal pain.  Urinalysis not consistent with urinary tract infection more suggestive of contamination.  But sent for culture.  No leukocytosis no liver function test abnormalities.  Lipase is normal.  Pregnancy test negative.  Patient will get CT scan of abdomen if this is negative feel in future probably does not need more needs follow-up with GI medicine does have a primary care doctor to follow-up with.   CT raising concerns about fluid.  Patient not showing any signs of any significant infection the fluid could be secondary to a ruptured ovarian cyst.  Not concerned about PID.  Patient's lower abdomen completely nontender.  No leukocytosis.  Will treat with antinausea medicine and pain medicine in addition patient also has epigastric abdominal pain.  If patient develops fever abdominal pain gets worse we will have her return and an ultrasound would be appropriate.   Final Clinical Impression(s) / ED Diagnoses Final diagnoses:  Generalized abdominal pain    Rx / DC Orders ED Discharge Orders    None       Vanetta Mulders, MD 04/12/20 1517    Vanetta Mulders, MD 04/12/20 [phone]

## 2020-04-13 ENCOUNTER — Ambulatory Visit (INDEPENDENT_AMBULATORY_CARE_PROVIDER_SITE_OTHER): Payer: No Typology Code available for payment source | Admitting: Nurse Practitioner

## 2020-04-13 ENCOUNTER — Encounter: Payer: Self-pay | Admitting: Nurse Practitioner

## 2020-04-13 ENCOUNTER — Encounter: Payer: Self-pay | Admitting: Gastroenterology

## 2020-04-13 VITALS — BP 115/72

## 2020-04-13 DIAGNOSIS — N898 Other specified noninflammatory disorders of vagina: Secondary | ICD-10-CM | POA: Diagnosis not present

## 2020-04-13 DIAGNOSIS — R102 Pelvic and perineal pain: Secondary | ICD-10-CM | POA: Diagnosis not present

## 2020-04-13 DIAGNOSIS — R300 Dysuria: Secondary | ICD-10-CM | POA: Diagnosis not present

## 2020-04-13 DIAGNOSIS — N760 Acute vaginitis: Secondary | ICD-10-CM

## 2020-04-13 DIAGNOSIS — B9689 Other specified bacterial agents as the cause of diseases classified elsewhere: Secondary | ICD-10-CM

## 2020-04-13 LAB — WET PREP FOR TRICH, YEAST, CLUE

## 2020-04-13 MED ORDER — METRONIDAZOLE 500 MG PO TABS: 500.0000 mg | ORAL_TABLET | Freq: Two times a day (BID) | ORAL | 0 refills | Status: DC

## 2020-04-13 NOTE — Progress Notes (Signed)
   Acute Office Visit  Subjective:    Patient ID: Rebecca Rocha, female    DOB: 01/18/2001, 19 y.o.   MRN: [id number]   HPI 19 y.o. presents today for ER follow up. She presented to the ER 03/13/2020 and 04/12/2020 with generalized abdominal pain. CT scan showed 7 mm hepatic lesion and small fluid within pelvis possibly related to ruptured ovarian cyst. Liver MRI ordered by PCP for further evaluation of lesion, has not been performed yet. She had cholecystectomy July 2021. Nexplanon in place since 08/2018, cycles monthly that typically last 3-4 days with most recent cycle being heavier and lasting 7 days. Today she complains of burning with urination, frequency, and urgency that started a few days ago, dark discharge, and pelvic pain x 4 days. The pain is bilateral, lower and cramp-like. Negative UA in ER yesterday. Sexually active with same partner for 3 years, declines STD testing.    Review of Systems  Constitutional: Negative.   Gastrointestinal: Negative.   Genitourinary: Positive for dysuria, frequency, pelvic pain, urgency and vaginal discharge. Negative for flank pain and hematuria.       Objective:    Physical Exam Constitutional:      General: She is not in acute distress.    Appearance: Normal appearance.  Abdominal:     Tenderness: There is abdominal tenderness in the right lower quadrant and left lower quadrant. There is no right CVA tenderness, left CVA tenderness or guarding.  Genitourinary:    General: Normal vulva.     Vagina: Bleeding (from menses) present. No vaginal discharge or erythema.     Cervix: Normal.     Uterus: Normal.      BP 115/72 (BP Location: Right Arm, Patient Position: Sitting, Cuff Size: Normal)   LMP 04/02/2020  Wt Readings from Last 3 Encounters:  04/12/20 143 lb 8 oz (65.1 kg)  04/07/20 141 lb 12.8 oz (64.3 kg)  03/13/20 142 lb (64.4 kg)   UA negative for leukocytes and nitrites, wbc 0-5, clue cells present Wet prep + clue  cells     Assessment & Plan:   Problem List Items Addressed This Visit    None    Visit Diagnoses    Pelvic pain    -  Primary   Relevant Orders   US PELVIS TRANSVAGINAL NON-OB (TV ONLY)   Burning with urination       Relevant Orders   Urinalysis,Complete w/RFL Culture   Vaginal discharge       Relevant Orders   WET PREP FOR TRICH, YEAST, CLUE   Bacterial vaginosis       Relevant Medications   metroNIDAZOLE (FLAGYL) 500 MG tablet     Plan: Wet prep positive for clue cells.  Flagyl 500 mg twice a day for 7 days with instructions to take with food and avoid alcohol.  UA unremarkable.  We will schedule pelvic ultrasound for further evaluation of pelvic pain and possible fluid in the pelvis. She is agreeable to plan.      Olivia Mackie [facility], 4:14 PM 04/13/2020

## 2020-04-13 NOTE — Patient Instructions (Addendum)
Bacterial Vaginosis  Bacterial vaginosis is a vaginal infection that occurs when the normal balance of bacteria in the vagina is disrupted. It results from an overgrowth of certain bacteria. This is the most common vaginal infection among women ages 15-44. Because bacterial vaginosis increases your risk for STIs (sexually transmitted infections), getting treated can help reduce your risk for chlamydia, gonorrhea, herpes, and HIV (human immunodeficiency virus). Treatment is also important for preventing complications in pregnant women, because this condition can cause an early (premature) delivery. What are the causes? This condition is caused by an increase in harmful bacteria that are normally present in small amounts in the vagina. However, the reason that the condition develops is not fully understood. What increases the risk? The following factors may make you more likely to develop this condition:  Having a new sexual partner or multiple sexual partners.  Having unprotected sex.  Douching.  Having an intrauterine device (IUD).  Smoking.  Drug and alcohol abuse.  Taking certain antibiotic medicines.  Being pregnant. You cannot get bacterial vaginosis from toilet seats, bedding, swimming pools, or contact with objects around you. What are the signs or symptoms? Symptoms of this condition include:  Grey or white vaginal discharge. The discharge can also be watery or foamy.  A fish-like odor with discharge, especially after sexual intercourse or during menstruation.  Itching in and around the vagina.  Burning or pain with urination. Some women with bacterial vaginosis have no signs or symptoms. How is this diagnosed? This condition is diagnosed based on:  Your medical history.  A physical exam of the vagina.  Testing a sample of vaginal fluid under a microscope to look for a large amount of bad bacteria or abnormal cells. Your health care provider may use a cotton swab or  a small wooden spatula to collect the sample. How is this treated? This condition is treated with antibiotics. These may be given as a pill, a vaginal cream, or a medicine that is put into the vagina (suppository). If the condition comes back after treatment, a second round of antibiotics may be needed. Follow these instructions at home: Medicines  Take over-the-counter and prescription medicines only as told by your health care provider.  Take or use your antibiotic as told by your health care provider. Do not stop taking or using the antibiotic even if you start to feel better. General instructions  If you have a female sexual partner, tell her that you have a vaginal infection. She should see her health care provider and be treated if she has symptoms. If you have a female sexual partner, he does not need treatment.  During treatment: ? Avoid sexual activity until you finish treatment. ? Do not douche. ? Avoid alcohol as directed by your health care provider. ? Avoid breastfeeding as directed by your health care provider.  Drink enough water and fluids to keep your urine clear or pale yellow.  Keep the area around your vagina and rectum clean. ? Wash the area daily with warm water. ? Wipe yourself from front to back after using the toilet.  Keep all follow-up visits as told by your health care provider. This is important. How is this prevented?  Do not douche.  Wash the outside of your vagina with warm water only.  Use protection when having sex. This includes latex condoms and dental dams.  Limit how many sexual partners you have. To help prevent bacterial vaginosis, it is best to have sex with just one partner (  monogamous).  Make sure you and your sexual partner are tested for STIs.  Wear cotton or cotton-lined underwear.  Avoid wearing tight pants and pantyhose, especially during summer.  Limit the amount of alcohol that you drink.  Do not use any products that contain  nicotine or tobacco, such as cigarettes and e-cigarettes. If you need help quitting, ask your health care provider.  Do not use illegal drugs. Where to find more information  Centers for Disease Control and Prevention: www.cdc.gov/std  American Sexual Health Association (ASHA): www.ashastd.org  [place]. Department of Health and Human Services, Office on Women's Health: www.womenshealth.gov/ or https://www.womenshealth.gov/a-z-topics/bacterial-vaginosis Contact a health care provider if:  Your symptoms do not improve, even after treatment.  You have more discharge or pain when urinating.  You have a fever.  You have pain in your abdomen.  You have pain during sex.  You have vaginal bleeding between periods. Summary  Bacterial vaginosis is a vaginal infection that occurs when the normal balance of bacteria in the vagina is disrupted.  Because bacterial vaginosis increases your risk for STIs (sexually transmitted infections), getting treated can help reduce your risk for chlamydia, gonorrhea, herpes, and HIV (human immunodeficiency virus). Treatment is also important for preventing complications in pregnant women, because the condition can cause an early (premature) delivery.  This condition is treated with antibiotic medicines. These may be given as a pill, a vaginal cream, or a medicine that is put into the vagina (suppository). This information is not intended to replace advice given to you by your health care provider. Make sure you discuss any questions you have with your health care provider. Document Revised: 04/13/2017 Document Reviewed: 01/15/2016 Elsevier Patient Education  2020 [employer]vier Inc.  

## 2020-04-14 LAB — URINE CULTURE

## 2020-04-15 LAB — URINE CULTURE
MICRO NUMBER:: 11257392
Result:: NO GROWTH
SPECIMEN QUALITY:: ADEQUATE

## 2020-04-15 LAB — URINALYSIS, COMPLETE W/RFL CULTURE
Bilirubin Urine: NEGATIVE
Glucose, UA: NEGATIVE
Hyaline Cast: NONE SEEN /LPF
Ketones, ur: NEGATIVE
Leukocyte Esterase: NEGATIVE
Nitrites, Initial: NEGATIVE
Protein, ur: NEGATIVE
Specific Gravity, Urine: 1.02 (ref 1.001–1.03)
pH: 7 (ref 5.0–8.0)

## 2020-04-15 LAB — CULTURE INDICATED

## 2020-04-19 ENCOUNTER — Other Ambulatory Visit: Payer: Self-pay

## 2020-04-20 ENCOUNTER — Encounter: Payer: Self-pay | Admitting: Nurse Practitioner

## 2020-04-20 ENCOUNTER — Ambulatory Visit (INDEPENDENT_AMBULATORY_CARE_PROVIDER_SITE_OTHER): Payer: No Typology Code available for payment source | Admitting: Nurse Practitioner

## 2020-04-20 VITALS — BP 102/74 | HR 70 | Temp 98.0°F | Ht 64.0 in | Wt 142.0 lb

## 2020-04-20 DIAGNOSIS — R1084 Generalized abdominal pain: Secondary | ICD-10-CM | POA: Diagnosis not present

## 2020-04-20 MED ORDER — HYOSCYAMINE SULFATE 0.125 MG PO TBDP: 0.1250 mg | ORAL_TABLET | Freq: Four times a day (QID) | ORAL | 2 refills | Status: DC | PRN

## 2020-04-20 NOTE — Patient Instructions (Addendum)
Go to lab for H.pylori breath test.  Also start IBGuard OTC 1cap daily x 4weeks. Maintain upcoming appt with GI.  Low-FODMAP Eating Plan  FODMAPs (fermentable oligosaccharides, disaccharides, monosaccharides, and polyols) are sugars that are hard for some people to digest. A low-FODMAP eating plan may help some people who have bowel (intestinal) diseases to manage their symptoms. This meal plan can be complicated to follow. Work with a diet and nutrition specialist (dietitian) to make a low-FODMAP eating plan that is right for you. A dietitian can make sure that you get enough nutrition from this diet. What are tips for following this plan? Reading food labels  Check labels for hidden FODMAPs such as: ? High-fructose syrup. ? Honey. ? Agave. ? Natural fruit flavors. ? Onion or garlic powder.  Choose low-FODMAP foods that contain 3-4 grams of fiber per serving.  Check food labels for serving sizes. Eat only one serving at a time to make sure FODMAP levels stay low. Meal planning  Follow a low-FODMAP eating plan for up to 6 weeks, or as told by your health care provider or dietitian.  To follow the eating plan: 1. Eliminate high-FODMAP foods from your diet completely. 2. Gradually reintroduce high-FODMAP foods into your diet one at a time. Most people should wait a few days after introducing one high-FODMAP food before they introduce the next high-FODMAP food. Your dietitian can recommend how quickly you may reintroduce foods. 3. Keep a daily record of what you eat and drink, and make note of any symptoms that you have after eating. 4. Review your daily record with a dietitian regularly. Your dietitian can help you identify which foods you can eat and which foods you should avoid. General tips  Drink enough fluid each day to keep your urine pale yellow.  Avoid processed foods. These often have added sugar and may be high in FODMAPs.  Avoid most dairy products, whole grains, and  sweeteners.  Work with a dietitian to make sure you get enough fiber in your diet. Recommended foods Grains  Gluten-free grains, such as rice, oats, buckwheat, quinoa, corn, polenta, and millet. Gluten-free pasta, bread, or cereal. Rice noodles. Corn tortillas. Vegetables  Eggplant, zucchini, cucumber, peppers, green beans, Brussels sprouts, bean sprouts, lettuce, arugula, kale, Swiss chard, spinach, collard greens, bok choy, summer squash, potato, and tomato. Limited amounts of corn, carrot, and sweet potato. Green parts of scallions. Fruits  Bananas, oranges, lemons, limes, blueberries, raspberries, strawberries, grapes, cantaloupe, honeydew melon, kiwi, papaya, passion fruit, and pineapple. Limited amounts of dried cranberries, banana chips, and shredded coconut. Dairy  Lactose-free milk, yogurt, and kefir. Lactose-free cottage cheese and ice cream. Non-dairy milks, such as almond, coconut, hemp, and rice milk. Yogurts made of non-dairy milks. Limited amounts of goat cheese, brie, mozzarella, parmesan, swiss, and other hard cheeses. Meats and other protein foods  Unseasoned beef, pork, poultry, or fish. Eggs. Tomasa Blase. Tofu (firm) and tempeh. Limited amounts of nuts and seeds, such as almonds, walnuts, [place] nuts, pecans, peanuts, pumpkin seeds, chia seeds, and sunflower seeds. Fats and oils  Butter-free spreads. Vegetable oils, such as olive, canola, and sunflower oil. Seasoning and other foods  Artificial sweeteners with names that do not end in "ol" such as aspartame, saccharine, and stevia. Maple syrup, white table sugar, raw sugar, brown sugar, and molasses. Fresh basil, coriander, parsley, rosemary, and thyme. Beverages  Water and mineral water. Sugar-sweetened soft drinks. Small amounts of orange juice or cranberry juice. Black and green tea. Most dry wines. Coffee. This  may not be a complete list of low-FODMAP foods. Talk with your dietitian for more information. Foods to  avoid Grains  Wheat, including kamut, durum, and semolina. Barley and bulgur. Couscous. Wheat-based cereals. Wheat noodles, bread, crackers, and pastries. Vegetables  Chicory root, artichoke, asparagus, cabbage, snow peas, sugar snap peas, mushrooms, and cauliflower. Onions, garlic, leeks, and the white part of scallions. Fruits  Fresh, dried, and juiced forms of apple, pear, watermelon, peach, plum, cherries, apricots, blackberries, boysenberries, figs, nectarines, and mango. Avocado. Dairy  Milk, yogurt, ice cream, and soft cheese. Cream and sour cream. Milk-based sauces. Custard. Meats and other protein foods  Fried or fatty meat. Sausage. Cashews and pistachios. Soybeans, baked beans, black beans, chickpeas, kidney beans, fava beans, navy beans, lentils, and split peas. Seasoning and other foods  Any sugar-free gum or candy. Foods that contain artificial sweeteners such as sorbitol, mannitol, isomalt, or xylitol. Foods that contain honey, high-fructose corn syrup, or agave. Bouillon, vegetable stock, beef stock, and chicken stock. Garlic and onion powder. Condiments made with onion, such as hummus, chutney, pickles, relish, salad dressing, and salsa. Tomato paste. Beverages  Chicory-based drinks. Coffee substitutes. Chamomile tea. Fennel tea. Sweet or fortified wines such as port or sherry. Diet soft drinks made with isomalt, mannitol, maltitol, sorbitol, or xylitol. Apple, pear, and mango juice. Juices with high-fructose corn syrup. This may not be a complete list of high-FODMAP foods. Talk with your dietitian to discuss what dietary choices are best for you.  Summary  A low-FODMAP eating plan is a short-term diet that eliminates FODMAPs from your diet to help ease symptoms of certain bowel diseases.  The eating plan usually lasts up to 6 weeks. After that, high-FODMAP foods are restarted gradually, one at a time, so you can find out which may be causing symptoms.  A low-FODMAP  eating plan can be complicated. It is best to work with a dietitian who has experience with this type of plan. This information is not intended to replace advice given to you by your health care provider. Make sure you discuss any questions you have with your health care provider. Document Revised: 04/13/2017 Document Reviewed: 12/26/2016 Elsevier Patient Education  [place].

## 2020-04-20 NOTE — Progress Notes (Signed)
Subjective:  Patient ID: Rebecca Rocha, female    DOB: 10-13-2000  Age: 19 y.o. MRN: [id number]  CC: Follow-up [facility] f/u-pt states stomach issues have not resolved after being seen at the hospital. Pt states she is still experiencing lower stomach pain along with cramping that wakes her out of her sleep and last for at least 1 hour. )  GI Problem The primary symptoms include abdominal pain and nausea. Primary symptoms do not include fever, weight loss, fatigue, vomiting, diarrhea, melena, hematemesis, jaundice, hematochezia, dysuria, myalgias, arthralgias or rash. The illness began more than 7 days ago. The onset was gradual. The problem has been gradually worsening.  The illness is also significant for anorexia and bloating. The illness does not include chills, dysphagia, odynophagia, constipation, back pain or itching. Associated medical issues do not include GERD, gallstones, liver disease, alcohol abuse, PUD or diverticulitis.  worse with any food intake. No improvement with pantoprazole x 2weeks. D/c over 2weeks ago. Reviewed lab results and radiology report completed by ED provider: no acute finding. She has an upcoming appt with GI: 05/27/2020  Reviewed past Medical, Social and Family history today.  Outpatient Medications Prior to Visit  Medication Sig Dispense Refill  . acetaminophen (TYLENOL) 500 MG tablet Take 1-2 tablets (500-1,000 mg total) by mouth every 6 (six) hours as needed for mild pain or moderate pain. 100 tablet 2  . albuterol (VENTOLIN HFA) 108 (90 Base) MCG/ACT inhaler 1-2 PUFFS EVERY 6 HOURS FOR WHEEZING OR SHORTNESS OF BREATH. NEED OFFICE VISIT FOR ADDITIONAL REFILL 6.7 each 1  . fluticasone (FLOVENT HFA) 110 MCG/ACT inhaler Inhale 1 puff into the lungs 2 (two) times daily. Rinse mouth after each use 1 each 2  . metroNIDAZOLE (FLAGYL) 500 MG tablet Take 1 tablet (500 mg total) by mouth 2 (two) times daily. 14 tablet 0  . ondansetron (ZOFRAN ODT) 4 MG  disintegrating tablet Take 1 tablet (4 mg total) by mouth every 8 (eight) hours as needed for nausea or vomiting. 20 tablet 0  . rizatriptan (MAXALT) 10 MG tablet Take 1 tablet (10 mg total) by mouth as needed for migraine. Every 2hrs as needed. No more than 30mg  in 24hrs. Need office visit for additional refills 9 tablet 0  . temazepam (RESTORIL) 7.5 MG capsule Take 1 capsule (7.5 mg total) by mouth at bedtime as needed for sleep. 30 capsule 2  . HYDROcodone-acetaminophen (NORCO/VICODIN) 5-325 MG tablet Take 1 tablet by mouth every 6 (six) hours as needed for moderate pain. 14 tablet 0  . ondansetron (ZOFRAN ODT) 4 MG disintegrating tablet Take 1 tablet (4 mg total) by mouth every 8 (eight) hours as needed for nausea. (Patient not taking: Reported on 04/13/2020) 10 tablet 1  . pantoprazole (PROTONIX) 20 MG tablet Take 2 tablets (40 mg total) by mouth daily for 10 days. 20 tablet 0   No facility-administered medications prior to visit.    ROS See HPI  Objective:  BP 102/74 (BP Location: Left Arm, Patient Position: Sitting, Cuff Size: Normal)   Pulse 70   Temp 98 F (36.7 C) (Temporal)   Ht 5\' 4"  (1.626 m)   Wt 142 lb (64.4 kg)   LMP 04/02/2020   SpO2 98%   BMI 24.37 kg/m   Physical Exam Cardiovascular:     Rate and Rhythm: Normal rate.     Pulses: Normal pulses.  Pulmonary:     Effort: Pulmonary effort is normal.  Abdominal:     General: Bowel sounds are normal. There  is no distension.     Palpations: Abdomen is soft. There is no mass.     Tenderness: There is no abdominal tenderness. There is no right CVA tenderness, left CVA tenderness or guarding.     Hernia: No hernia is present.  Skin:    Findings: No rash.  Neurological:     Mental Status: She is alert and oriented to person, place, and time.  Psychiatric:        Mood and Affect: Mood normal.        Behavior: Behavior normal.        Thought Content: Thought content normal.     Assessment & Plan:  This visit  occurred during the SARS-CoV-2 public health emergency.  Safety protocols were in place, including screening questions prior to the visit, additional usage of staff PPE, and extensive cleaning of exam room while observing appropriate contact time as indicated for disinfecting solutions.   Vaudie was seen today for follow-up.  Diagnoses and all orders for this visit:  Generalized abdominal pain -     hyoscyamine (ANASPAZ) 0.125 MG TBDP disintergrating tablet; Place 1 tablet (0.125 mg total) under the tongue every 6 (six) hours as needed for cramping. -     H. pylori breath test  Go to lab for H.pylori breath test. Also start IBGuard OTC 1cap daily x 4weeks. Follow low-FODMAP diet, maintain adequate oral hydration Maintain upcoming appt with GI.  Problem List Items Addressed This Visit    None    Visit Diagnoses    Generalized abdominal pain    -  Primary   Relevant Medications   hyoscyamine (ANASPAZ) 0.125 MG TBDP disintergrating tablet   Other Relevant Orders   H. pylori breath test      Follow-up: No follow-ups on file.  Alysia Penna, NP

## 2020-04-21 LAB — H. PYLORI BREATH TEST: H. pylori Breath Test: NOT DETECTED

## 2020-04-22 ENCOUNTER — Other Ambulatory Visit: Payer: No Typology Code available for payment source

## 2020-04-22 ENCOUNTER — Ambulatory Visit (INDEPENDENT_AMBULATORY_CARE_PROVIDER_SITE_OTHER): Payer: No Typology Code available for payment source | Admitting: Nurse Practitioner

## 2020-04-22 ENCOUNTER — Ambulatory Visit (INDEPENDENT_AMBULATORY_CARE_PROVIDER_SITE_OTHER): Payer: No Typology Code available for payment source

## 2020-04-22 ENCOUNTER — Other Ambulatory Visit: Payer: Self-pay

## 2020-04-22 ENCOUNTER — Encounter: Payer: Self-pay | Admitting: Nurse Practitioner

## 2020-04-22 VITALS — BP 110/80

## 2020-04-22 DIAGNOSIS — R102 Pelvic and perineal pain: Secondary | ICD-10-CM

## 2020-04-22 DIAGNOSIS — N854 Malposition of uterus: Secondary | ICD-10-CM

## 2020-04-22 DIAGNOSIS — R1030 Lower abdominal pain, unspecified: Secondary | ICD-10-CM

## 2020-04-22 NOTE — Progress Notes (Signed)
History: 19 year old presents today to discuss ultrasound.  Was seen by me 04/13/2020 after an ER visit where she was seen for generalized abdominal pain.  CT in the emergency room showed small fluid within the pelvis possibly related to ruptured ovarian cyst.  She still complains of mild lower abdominal pain.  She has monthly cycles with Nexplanon, the pain has not cyclic.  The pain is bilateral, lower and cramp-like.  Declined STD testing at last visit.  She has an appointment with GI 06/01/2020. She does have stress right now related to school/exam week.   Exam: Appears well Ultrasound: Anteverted uterus, normal size and shape, no myometrial masses.  Symmetrical endometrium, no masses or thickening seen.  Both ovaries normal size with normal follicle pattern.  No adnexal masses, no free fluid.  Assessment: Lower abdominal pain Normal pelvic ultrasound  Plan: Discussed ultrasound and reassurance provided on normal findings. Recommend keeping GI appointment for further evaluation of abdominal pain. She is agreeable to plan.

## 2020-05-17 ENCOUNTER — Other Ambulatory Visit: Payer: Self-pay | Admitting: Nurse Practitioner

## 2020-05-18 NOTE — Telephone Encounter (Signed)
Spoke with pt to confirm she requested medication. Pt states medication was prescribed by a surgeon of hers a few months ago. Instructed patient to call that provider to see if she could get a refill and if she could not she could call our office and schedule an appointment to discuss benefits of this medication with [facility] Nche-NP. Pt verbalized understanding.

## 2020-05-31 ENCOUNTER — Ambulatory Visit: Payer: No Typology Code available for payment source | Admitting: Obstetrics & Gynecology

## 2020-06-01 ENCOUNTER — Ambulatory Visit (INDEPENDENT_AMBULATORY_CARE_PROVIDER_SITE_OTHER): Payer: 59

## 2020-06-01 ENCOUNTER — Ambulatory Visit
Admission: EM | Admit: 2020-06-01 | Discharge: 2020-06-01 | Disposition: A | Discharge status: Home / Self Care | Payer: 59 | Attending: Emergency Medicine | Admitting: Emergency Medicine

## 2020-06-01 ENCOUNTER — Ambulatory Visit: Payer: No Typology Code available for payment source | Admitting: Gastroenterology

## 2020-06-01 ENCOUNTER — Other Ambulatory Visit: Payer: Self-pay

## 2020-06-01 DIAGNOSIS — M25562 Pain in left knee: Secondary | ICD-10-CM | POA: Diagnosis not present

## 2020-06-01 DIAGNOSIS — S8002XA Contusion of left knee, initial encounter: Secondary | ICD-10-CM

## 2020-06-01 DIAGNOSIS — W009XXA Unspecified fall due to ice and snow, initial encounter: Secondary | ICD-10-CM

## 2020-06-01 MED ORDER — IBUPROFEN 800 MG PO TABS: 800.0000 mg | ORAL_TABLET | Freq: Three times a day (TID) | ORAL | 0 refills | Status: DC

## 2020-06-01 NOTE — ED Provider Notes (Signed)
[facility]    CSN: [id number] Arrival date & time: 06/01/20  1358      History   Chief Complaint Chief Complaint  Patient presents with  . Ankle Pain    HPI Rebecca Rocha is a 20 y.o. female presenting today for evaluation of left ankle pain.  Patient reports that she slipped on ice approximately 2 days ago.  Reports left leg went under her body, left knee and ankle took impact.  Majority of pain is at knee, but will radiate into lower leg.  Reports mild discomfort in ankle and lower leg, but increased pain around the knee.  Has been walking in cam walker which has provided some relief of pain with weightbearing. HPI  Past Medical History:  Diagnosis Date  . Acute calculous cholecystitis 11/26/2019  . Asthma   . Menstrual migraine, intractable, without status migrainosus   . Panic attack   . Sinusitis   . Syncope and collapse   . Vertigo of central origin, unspecified ear     Patient Active Problem List   Diagnosis Date Noted  . Insomnia 04/07/2020  . Hepatic lesion 04/07/2020  . S/P laparoscopic cholecystectomy 12/08/2019  . Chronic tension-type headache, not intractable 12/08/2019  . 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 Chronic upper back pain 09/18/2019  . Seizure-like activity (HCC) 01/08/2018  . 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 Asthma 10/27/2016    Past Surgical History:  Procedure Laterality Date  . CHOLECYSTECTOMY N/A 11/26/2019   Procedure: LAPAROSCOPIC CHOLECYSTECTOMY;  Surgeon: Rodman Pickle, MD;  Location: [facility];  Service: General;  Laterality: N/A;    OB History    Gravida  0   Para  0   Term  0   Preterm  0   AB  0   Living  0     SAB  0   IAB  0   Ectopic  0   Multiple  0   Live Births  0            Home Medications    Prior to Admission medications   Medication Sig Start Date End Date Taking? Authorizing Provider  ibuprofen (ADVIL) 800 MG tablet Take 1 tablet (800 mg total) by  mouth 3 (three) times daily. 06/01/20  Yes Chevy Virgo C, PA-C  acetaminophen (TYLENOL) 500 MG tablet Take 1-2 tablets (500-1,000 mg total) by mouth every 6 (six) hours as needed for mild pain or moderate pain. 11/27/19 11/26/20  Adam Phenix, PA-C  albuterol (VENTOLIN HFA) 108 (90 Base) MCG/ACT inhaler 1-2 PUFFS EVERY 6 HOURS FOR WHEEZING OR SHORTNESS OF BREATH. NEED OFFICE VISIT FOR ADDITIONAL REFILL 03/22/20   Nche, Bonna Gains, NP  fluticasone (FLOVENT HFA) 110 MCG/ACT inhaler Inhale 1 puff into the lungs 2 (two) times daily. Rinse mouth after each use 03/01/20   Nche, Bonna Gains, NP  hyoscyamine (ANASPAZ) 0.125 MG TBDP disintergrating tablet Place 1 tablet (0.125 mg total) under the tongue every 6 (six) hours as needed for cramping. 04/20/20   Nche, Bonna Gains, NP  ondansetron (ZOFRAN ODT) 4 MG disintegrating tablet Take 1 tablet (4 mg total) by mouth every 8 (eight) hours as needed for nausea or vomiting. 03/13/20   Alvira Monday, MD  rizatriptan (MAXALT) 10 MG tablet Take 1 tablet (10 mg total) by mouth as needed for migraine. Every 2hrs as needed. No more than 30mg  in 24hrs. Need office visit for additional refills 02/26/20   Nche, 02/28/20, NP  temazepam (RESTORIL) 7.5 MG  capsule Take 1 capsule (7.5 mg total) by mouth at bedtime as needed for sleep. 04/07/20   Nche, Bonna Gains, NP    Family History Family History  Problem Relation Age of Onset  . Hypertension Mother   . Liver disease Mother   . Other Mother        GENETIC (INHERITED) DISORDER  . Cancer - Other Mother        breast mass-possible cancer?  Marland Kitchen Hypertension Father   . Diabetes Father   . Cancer Paternal Grandmother   . Diabetes Paternal Grandmother   . Hypertension Paternal Grandmother   . High Cholesterol Paternal Grandmother   . Cancer Paternal Grandfather   . Diabetes Paternal Grandfather   . Hypertension Paternal Grandfather   . High Cholesterol Paternal Grandfather   . Anxiety disorder  Sister   . Cancer Maternal Aunt     Social History Social History   Tobacco Use  . Smoking status: Never Smoker  . Smokeless tobacco: Never Used  Vaping Use  . Vaping Use: Never used  Substance Use Topics  . Alcohol use: Yes    Comment: RARE  . Drug use: No     Allergies   Patient has no known allergies.   Review of Systems Review of Systems  Constitutional: Negative for fatigue and fever.  Eyes: Negative for visual disturbance.  Respiratory: Negative for shortness of breath.   Cardiovascular: Negative for chest pain.  Gastrointestinal: Negative for abdominal pain, nausea and vomiting.  Musculoskeletal: Positive for arthralgias and joint swelling. Negative for gait problem.  Skin: Negative for color change, rash and wound.  Neurological: Negative for dizziness, weakness, light-headedness and headaches.     Physical Exam Triage Vital Signs ED Triage Vitals  Enc Vitals Group     BP 06/01/20 1441 111/72     Pulse Rate 06/01/20 1441 73     Resp 06/01/20 1441 16     Temp 06/01/20 1441 98.2 F (36.8 C)     Temp Source 06/01/20 1441 Oral     SpO2 06/01/20 1441 98 %     Weight --      Height --      Head Circumference --      Peak Flow --      Pain Score 06/01/20 1452 9     Pain Loc --      Pain Edu? --      Excl. in GC? --    No data found.  Updated Vital Signs BP 111/72 (BP Location: Left Arm)   Pulse 73   Temp 98.2 F (36.8 C) (Oral)   Resp 16   SpO2 98%   Visual Acuity Right Eye Distance:   Left Eye Distance:   Bilateral Distance:    Right Eye Near:   Left Eye Near:    Bilateral Near:     Physical Exam Vitals and nursing note reviewed.  Constitutional:      Appearance: She is well-developed and well-nourished.     Comments: No acute distress  HENT:     Head: Normocephalic and atraumatic.     Nose: Nose normal.  Eyes:     Conjunctiva/sclera: Conjunctivae normal.  Cardiovascular:     Rate and Rhythm: Normal rate.  Pulmonary:      Effort: Pulmonary effort is normal. No respiratory distress.  Abdominal:     General: There is no distension.  Musculoskeletal:        General: Normal range of motion.  Cervical back: Neck supple.  Skin:    General: Skin is warm and dry.     Comments: Left knee: Erythema abrasion and swelling noted to anterior knee, diffuse tenderness throughout this area, mild medial and lateral joint line tenderness, no popliteal tenderness, limited range of motion due to swelling,  No obvious swelling deformity or discoloration noted to lower leg, mild tenderness diffusely along fibula  Left ankle: No swelling deformity or discoloration, minimal tenderness noted to medial malleolus, nontender to lateral malleolus  Left foot: Nontender diffusely throughout dorsum of foot, dorsalis pedis 2+  Neurological:     Mental Status: She is alert and oriented to person, place, and time.  Psychiatric:        Mood and Affect: Mood and affect normal.      UC Treatments / Results  Labs (all labs ordered are listed, but only abnormal results are displayed) Labs Reviewed - No data to display  EKG   Radiology DG Knee Complete 4 Views Left  Result Date: 06/01/2020 CLINICAL DATA:  Left knee pain status post fall on ice. EXAM: LEFT KNEE - COMPLETE 4+ VIEW COMPARISON:  None. FINDINGS: No acute abnormality of the osseous structures. Soft tissues are unremarkable. IMPRESSION: No acute abnormality of the left knee. Electronically Signed   By: Acquanetta Belling M.D.   On: 06/01/2020 16:10    Procedures Procedures (including critical care time)  Medications Ordered in UC Medications - No data to display  Initial Impression / Assessment and Plan / UC Course  I have reviewed the triage vital signs and the nursing notes.  Pertinent labs & imaging results that were available during my care of the patient were reviewed by me and considered in my medical decision making (see chart for details).     X-ray negative  for fracture otherwise exam reassuring and low suspicion of fracture in ankle or foot. May continue to wear boot for comfort, anti-inflammatories rest ice elevation and Ace wrap for compression to knee. Monitor for gradual resolution.  Discussed strict return precautions. Patient verbalized understanding and is agreeable with plan.  Final Clinical Impressions(s) / UC Diagnoses   Final diagnoses:  Contusion of left knee, initial encounter     Discharge Instructions     Xray normal Use anti-inflammatories for pain/swelling. You may take up to 800 mg Ibuprofen every 8 hours with food. You may supplement Ibuprofen with Tylenol [phone] mg every 8 hours.  Ice and elevate ACE wrap for compression/support     ED Prescriptions    Medication Sig Dispense Auth. Provider   ibuprofen (ADVIL) 800 MG tablet Take 1 tablet (800 mg total) by mouth 3 (three) times daily. 21 tablet [place], [place] C, PA-C     PDMP not reviewed this encounter.   Lew Dawes, [place] 06/01/20 1619

## 2020-06-01 NOTE — ED Triage Notes (Signed)
Pt states slipped on ice in the driveway on Sunday with her whole body weight on her LLE. Pt c/o lt ankle pain radiating up to lt knee. Pt states is wearing a cam boot that she had and states pain is better when wearing the boot but severe if not. States has a itchy abrasion to lt knee where she fell.

## 2020-06-01 NOTE — Discharge Instructions (Addendum)
Xray normal Use anti-inflammatories for pain/swelling. You may take up to 800 mg Ibuprofen every 8 hours with food. You may supplement Ibuprofen with Tylenol [phone] mg every 8 hours.  Ice and elevate ACE wrap for compression/support

## 2020-06-02 ENCOUNTER — Ambulatory Visit (INDEPENDENT_AMBULATORY_CARE_PROVIDER_SITE_OTHER): Payer: 59 | Admitting: Obstetrics & Gynecology

## 2020-06-02 ENCOUNTER — Encounter: Payer: Self-pay | Admitting: Obstetrics & Gynecology

## 2020-06-02 VITALS — BP 130/80

## 2020-06-02 DIAGNOSIS — Z3046 Encounter for surveillance of implantable subdermal contraceptive: Secondary | ICD-10-CM | POA: Diagnosis not present

## 2020-06-02 NOTE — Progress Notes (Signed)
    Rebecca Rocha 24-Nov-2000 [id number]        20 y.o.  G0  Engaged  RP: BTB, H/As and nausea on Nexplanon  HPI: On Nexplanon x 08/2018.  For the last 2 months, had BTB for a few days twice a month, H/As and nausea.  Desires removal of the Nexplanon.  Will use condoms until reassessed by surgeon who did her Cholecystectomy.  May choose to start on BCPs or DepoProvera at that point.   OB History  Gravida Para Term Preterm AB Living  0 0 0 0 0 0  SAB IAB Ectopic Multiple Live Births  0 0 0 0 0    Past medical history,surgical history, problem list, medications, allergies, family history and social history were all reviewed and documented in the EPIC chart.   Directed ROS with pertinent positives and negatives documented in the history of present illness/assessment and plan.  Exam:  Vitals:   06/02/20 1407  BP: 130/80   General appearance:  Normal                                                             Nexplanon procedure note (removal)  The patient presented to the office today requesting for removal of her Nexplanon that was placed in the year 08/2018 on her Left arm.   On examination the nexplanon implant was palpated and the distal end  (end closest to the elbow) was marked. The area was sterilized with Betadine solution. 1% lidocaine was used for local anesthesia and approximately 1 cc  was injected into the site that was marked where the incision was to be made. The local anesthetic was injected under the implant in an effort to keep it  close to the skin surface. Slight pressure pushing downward was made at the proximal end  of the implant in an effort to stabilize it. A bulge appeared indicating the distal end of the implant. A small transverse incision of 2 mm was made at that location. By gently pushing the implant toward the incision, the tip became visible. Grasping the implant with a curved forcep facilitated in gently removing the implant. Full confirmation of  the entire implant which is 4 cm long was inspected and was intact and was shown to the patient and discarded. After removing the implant, the incision was closed with 3Steri-Strips, a band-aid and a bandage. Patient will be instructed to remove the pressure bandage in 24 hours, the band-aid in 3 days and the Steri-Strips in 7 days.    Assessment/Plan:  20 y.o. G0   1. Encounter for Nexplanon removal Side effects from Nexplanon with BTB, H/A's and nausea.  Desired removal.  Easy removal of Nexplanon without complication.  Post procedure precautions reviewed.  Will use condoms until decides between the DepoProvera or BCPs after f/u with her surgeon who did her Cholecystectomy.  Rebecca Del MD, 2:16 PM 06/02/2020

## 2020-06-14 ENCOUNTER — Other Ambulatory Visit: Payer: Self-pay | Admitting: Nurse Practitioner

## 2020-06-14 ENCOUNTER — Other Ambulatory Visit: Payer: Self-pay

## 2020-06-14 ENCOUNTER — Encounter: Payer: Self-pay | Admitting: Physician Assistant

## 2020-06-14 ENCOUNTER — Ambulatory Visit (INDEPENDENT_AMBULATORY_CARE_PROVIDER_SITE_OTHER): Payer: 59 | Admitting: Physician Assistant

## 2020-06-14 VITALS — BP 104/50 | HR 64 | Ht 61.0 in | Wt 143.8 lb

## 2020-06-14 DIAGNOSIS — N6310 Unspecified lump in the right breast, unspecified quadrant: Secondary | ICD-10-CM

## 2020-06-14 DIAGNOSIS — K219 Gastro-esophageal reflux disease without esophagitis: Secondary | ICD-10-CM

## 2020-06-14 DIAGNOSIS — R630 Anorexia: Secondary | ICD-10-CM

## 2020-06-14 DIAGNOSIS — R634 Abnormal weight loss: Secondary | ICD-10-CM

## 2020-06-14 DIAGNOSIS — K921 Melena: Secondary | ICD-10-CM

## 2020-06-14 DIAGNOSIS — R1084 Generalized abdominal pain: Secondary | ICD-10-CM

## 2020-06-14 DIAGNOSIS — R110 Nausea: Secondary | ICD-10-CM | POA: Diagnosis not present

## 2020-06-14 NOTE — Patient Instructions (Signed)
If you are age 22 or older, your body mass index should be between 23-30. Your Body mass index is 27.17 kg/m. If this is out of the aforementioned range listed, please consider follow up with your Primary Care Provider.  If you are age 58 or younger, your body mass index should be between 19-25. Your Body mass index is 27.17 kg/m. If this is out of the aformentioned range listed, please consider follow up with your Primary Care Provider.   You have been scheduled for an endoscopy and colonoscopy. Please follow the written instructions given to you at your visit today. Please pick up your prep supplies at the pharmacy within the next 1-3 days. If you use inhalers (even only as needed), please bring them with you on the day of your procedure.  Please continue your Hyoscyamine and Zofran as needed.  Thank you for choosing me and [facility].  Hyacinth Meeker, PA-C

## 2020-06-14 NOTE — Progress Notes (Addendum)
Chief Complaint: Follow-up ED visit for abdominal pain  HPI:    Rebecca Rocha is a 20 year old female with a past medical history as listed below, who was referred to me by Nche, Bonna Gains, NP for a complaint of abdominal pain.      04/12/2020 patient seen in the ER for abdominal pain.  At that time described epigastric abdominal pain and bilateral lower abdominal pain for the past few days.  Apparently was taking Zofran for nausea.  Patient had similar symptoms leading up to her gallbladder removal for acalculus cholecystitis in July.  CBC normal, urinalysis with a large amount of hemoglobin and trace leukocytes, normal lipase and CMP.  Negative pregnancy test.  CT abdomen pelvis with a small amount of free fluid within the pelvis and otherwise normal.  Also subcentimeter hypodense lesion in the right lobe of the liver, recommended MRI follow-up.    Today, the patient presents to clinic and tells me that back in July she went to the ER after having too much to drink the night before and having vomiting and they told her that they needed her gallbladder out.  She was admitted to the hospital and she had a cholecystectomy.  Ever since then she has had abdominal pain which she describes is so severe that she wakes up screaming in pain at night and can only sleep for an hour or 2 in a row.  Rated as a constant 6-7/10, worsens to a 10/10.  Describes the pain is mostly over the area of her incisions and in her lower abdomen and definitely worse with movement bending or lifting anything.  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  Tells me that along with this she has experienced some reflux and nausea and has no appetite telling me that sometimes she will go the whole day without eating at all.  She has dropped from 160 pounds down to 143 over the past 6 to 7 months.  Also describes that she will have at least 4 loose stools per day and  sometimes sees some bright red blood in them.  Tells me that she called the surgeons but they told her she needed a GI evaluation before they would see her back.  Does use hyoscyamine with occasional relief of lower abdominal pain and does use Zofran with relief of nausea.    Denies fever or chills.  Past Medical History:  Diagnosis Date  . Acute calculous cholecystitis 11/26/2019  . Asthma   . Menstrual migraine, intractable, without status migrainosus   . Panic attack   . Sinusitis   . Syncope and collapse   . Vertigo of central origin, unspecified ear     Past Surgical History:  Procedure Laterality Date  . CHOLECYSTECTOMY N/A 11/26/2019   Procedure: LAPAROSCOPIC CHOLECYSTECTOMY;  Surgeon: Kinsinger, De Blanch, MD;  Location: [facility];  Service: General;  Laterality: N/A;    Current Outpatient Medications  Medication Sig Dispense Refill  . acetaminophen (TYLENOL) 500 MG tablet Take 1-2 tablets (500-1,000 mg total) by mouth every 6 (six) hours as needed for mild pain or moderate pain. 100 tablet 2  . albuterol (VENTOLIN HFA) 108 (90 Base) MCG/ACT inhaler 1-2 PUFFS EVERY 6 HOURS FOR WHEEZING OR SHORTNESS OF BREATH. NEED OFFICE VISIT FOR ADDITIONAL REFILL 6.7 each 1  . fluticasone (FLOVENT HFA) 110 MCG/ACT inhaler Inhale 1 puff into the lungs 2 (two) times daily. Rinse  mouth after each use 1 each 2  . hyoscyamine (ANASPAZ) 0.125 MG TBDP disintergrating tablet Place 1 tablet (0.125 mg total) under the tongue every 6 (six) hours as needed for cramping. 90 tablet 2  . ibuprofen (ADVIL) 800 MG tablet Take 1 tablet (800 mg total) by mouth 3 (three) times daily. 21 tablet 0  . ondansetron (ZOFRAN ODT) 4 MG disintegrating tablet Take 1 tablet (4 mg total) by mouth every 8 (eight) hours as needed for nausea or vomiting. 20 tablet 0  . rizatriptan (MAXALT) 10 MG tablet Take 1 tablet (10 mg total) by mouth as needed for migraine. Every 2hrs as needed. No more than 30mg  in 24hrs. Need office visit for  additional refills 9 tablet 0  . temazepam (RESTORIL) 7.5 MG capsule Take 1 capsule (7.5 mg total) by mouth at bedtime as needed for sleep. 30 capsule 2   No current facility-administered medications for this visit.    Allergies as of 06/14/2020  . (No Known Allergies)    Family History  Problem Relation Age of Onset  . Hypertension Mother   . Liver disease Mother   . Other Mother        GENETIC (INHERITED) DISORDER  . Cancer - Other Mother        breast mass-possible cancer?  06/16/2020 Hypertension Father   . Diabetes Father   . Cancer Paternal Grandmother   . Diabetes Paternal Grandmother   . Hypertension Paternal Grandmother   . High Cholesterol Paternal Grandmother   . Cancer Paternal Grandfather   . Diabetes Paternal Grandfather   . Hypertension Paternal Grandfather   . High Cholesterol Paternal Grandfather   . Anxiety disorder Sister   . Cancer Maternal Aunt     Social History   Socioeconomic History  . Marital status: Single    Spouse name: Not on file  . Number of children: 0  . Years of education: Not on file  . Highest education level: Not on file  Occupational History  . Not on file  Tobacco Use  . Smoking status: Never Smoker  . Smokeless tobacco: Never Used  Vaping Use  . Vaping Use: Never used  Substance and Sexual Activity  . Alcohol use: Yes    Comment: RARE  . Drug use: No  . Sexual activity: Yes    Partners: Male    Birth control/protection: Implant, Condom    Comment: 1st intercourse- 15, partners- 5, current partnER- 3 YRS  Other Topics Concern  . 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 [employer]   Financial Resource Strain: Not on file  Food Insecurity: Not on file  Transportation Needs: Not on file  Physical Activity: Not on file  Stress: Not on file  Social Connections: Not on file  Intimate Partner Violence: Not on file    Review of Systems:    Constitutional: No weight loss, fever or  chills Skin: No rash  Cardiovascular: No chest pain   Respiratory: No SOB  Gastrointestinal: See HPI and otherwise negative Genitourinary: No dysuria  Neurological: No headache, dizziness or syncope Musculoskeletal: No new muscle or joint pain Hematologic: No bruising Psychiatric: No history of depression or anxiety   Physical Exam:  Vital signs: BP (!) 104/50   Pulse 64   Ht 5\' 1"  (1.549 m)   Wt 143 lb 12.8 oz (65.2 kg)   LMP  (LMP Unknown)   SpO2 98%   BMI 27.17 kg/m  Constitutional:   Pleasant HIspanic female appears to be in NAD, Well developed, Well nourished, alert and cooperative Head:  Normocephalic and atraumatic. Eyes:   PEERL, EOMI. No icterus. Conjunctiva pink. Ears:  Normal auditory acuity. Neck:  Supple Throat: Oral cavity and pharynx without inflammation, swelling or lesion.  Respiratory: Respirations even and unlabored. Lungs clear to auscultation bilaterally.   No wheezes, crackles, or rhonchi.  Cardiovascular: Normal S1, S2. No MRG. Regular rate and rhythm. No peripheral edema, cyanosis or pallor.  Gastrointestinal:  Soft, nondistended,mild ttp over lower abdomen . No rebound or guarding. Normal bowel sounds. No appreciable masses or hepatomegaly. Rectal:  Not performed.  Msk:  Symmetrical without gross deformities. Without edema, no deformity or joint abnormality.  Neurologic:  Alert and  oriented x4;  grossly normal neurologically.  Skin:   Dry and intact without significant lesions or rashes. Psychiatric: Demonstrates good judgement and reason without abnormal affect or behaviors.  RELEVANT LABS AND IMAGING: CBC    Component Value Date/Time   WBC 7.8 04/12/2020 0929   RBC 4.41 04/12/2020 0929   HGB 13.3 04/12/2020 0929   HCT 42.0 04/12/2020 0929   PLT 323 04/12/2020 0929   MCV 95.2 04/12/2020 0929   MCH 30.2 04/12/2020 0929   MCHC 31.7 04/12/2020 0929   RDW 11.4 (L) 04/12/2020 0929   LYMPHSABS 3.1 02/27/2020 0924   MONOABS 0.6 02/27/2020 0924    EOSABS 0.2 02/27/2020 0924   BASOSABS 0.1 02/27/2020 0924    CMP     Component Value Date/Time   NA 136 04/12/2020 0929   K 3.6 04/12/2020 0929   CL 105 04/12/2020 0929   CO2 23 04/12/2020 0929   GLUCOSE 96 04/12/2020 0929   BUN 10 04/12/2020 0929   CREATININE 0.62 04/12/2020 0929   CALCIUM 8.9 04/12/2020 0929   PROT 8.0 04/12/2020 0929   ALBUMIN 3.8 04/12/2020 0929   AST 17 04/12/2020 0929   ALT 14 04/12/2020 0929   ALKPHOS 55 04/12/2020 0929   BILITOT 0.6 04/12/2020 0929   GFRNONAA >60 04/12/2020 0929   GFRAA >60 11/26/2019 3009    Assessment: 1.  Generalized abdominal pain: Ever since cholecystectomy in July, seems worse over her incisions and definitely worse with lifting/pulling, recent CT of the abdomen unrevealing; consider adhesions 2.  GERD: Moderate; consider gastritis related to bile versus other 3.  Nausea: With above 4.  Hematochezia: Occasionally sees bright red blood in stool; consider hemorrhoids versus other 5.  Weight loss: 20 pounds per patient over the past 6 months 6.  Appetite loss: Ever since cholecystectomy; consider bile reflux?  Versus other  Plan: 1.  Scheduled patient for diagnostic EGD and colonoscopy in the [facility].  These were scheduled with Dr. Chales Abrahams due to his sooner availability.  She will follow up with him after procedures as her primary GI physician.  Provided patient with a detailed list of risks for the procedures and she agrees to proceed.  She has had her Covid vaccines. 2.  Patient will continue Hyoscyamine sulfate 0.125 mg as needed for abdominal pain as well as Zofran 4 mg ODT for nausea 3.  Discussed with patient that if we do not find anything on these procedures then would recommend she follow with the surgeons for reevaluation. 4.  Patient to follow in clinic per recommendations from Dr. Chales Abrahams after time of procedures.  Hyacinth Meeker, PA-C [facility] 06/14/2020, 2:09 PM  Cc: Anne Ng, NP

## 2020-06-15 ENCOUNTER — Other Ambulatory Visit: Payer: Self-pay

## 2020-06-15 ENCOUNTER — Ambulatory Visit
Admission: RE | Admit: 2020-06-15 | Discharge: 2020-06-15 | Disposition: A | Discharge status: Home / Self Care | Payer: 59 | Source: Ambulatory Visit | Attending: Family Medicine | Admitting: Family Medicine

## 2020-06-15 DIAGNOSIS — N6310 Unspecified lump in the right breast, unspecified quadrant: Secondary | ICD-10-CM

## 2020-06-16 NOTE — Progress Notes (Signed)
Agree with plan RG 

## 2020-06-24 ENCOUNTER — Other Ambulatory Visit: Payer: Self-pay

## 2020-06-24 ENCOUNTER — Ambulatory Visit (AMBULATORY_SURGERY_CENTER): Payer: 59 | Admitting: Gastroenterology

## 2020-06-24 ENCOUNTER — Encounter: Payer: Self-pay | Admitting: Gastroenterology

## 2020-06-24 VITALS — BP 108/72 | HR 71 | Temp 98.0°F | Resp 73 | Ht 61.0 in | Wt 143.0 lb

## 2020-06-24 DIAGNOSIS — R1084 Generalized abdominal pain: Secondary | ICD-10-CM

## 2020-06-24 DIAGNOSIS — K2970 Gastritis, unspecified, without bleeding: Secondary | ICD-10-CM | POA: Diagnosis not present

## 2020-06-24 DIAGNOSIS — K625 Hemorrhage of anus and rectum: Secondary | ICD-10-CM | POA: Diagnosis not present

## 2020-06-24 DIAGNOSIS — K921 Melena: Secondary | ICD-10-CM

## 2020-06-24 DIAGNOSIS — K648 Other hemorrhoids: Secondary | ICD-10-CM

## 2020-06-24 DIAGNOSIS — K2951 Unspecified chronic gastritis with bleeding: Secondary | ICD-10-CM | POA: Diagnosis not present

## 2020-06-24 MED ORDER — SODIUM CHLORIDE 0.9 % IV SOLN: 500.0000 mL | Freq: Once | INTRAVENOUS | Status: DC

## 2020-06-24 NOTE — Progress Notes (Signed)
Medical history reviewed with no changes noted. VS assessed by S.H, RN 

## 2020-06-24 NOTE — Op Note (Signed)
[facility] Patient Name: Rebecca Rocha Procedure Date: 06/24/2020 2:57 PM MRN: [id number] Endoscopist: Lynann Bologna , MD Age: 20 Referring MD:  Date of Birth: 2000/06/19 Gender: Female Account #: [id number] Procedure:                Upper GI endoscopy Indications:              Epigastric abdominal pain with occ nausea. Medicines:                Monitored Anesthesia Care Procedure:                Pre-Anesthesia Assessment:                           - Prior to the procedure, a History and Physical                            was performed, and patient medications and                            allergies were reviewed. The patient's tolerance of                            previous anesthesia was also reviewed. The risks                            and benefits of the procedure and the sedation                            options and risks were discussed with the patient.                            All questions were answered, and informed consent                            was obtained. Prior Anticoagulants: The patient has                            taken no previous anticoagulant or antiplatelet                            agents. ASA Grade Assessment: I - A normal, healthy                            patient. After reviewing the risks and benefits,                            the patient was deemed in satisfactory condition to                            undergo the procedure.                           After obtaining informed consent, the endoscope was  passed under direct vision. Throughout the                            procedure, the patient's blood pressure, pulse, and                            oxygen saturations were monitored continuously. The                            Endoscope was introduced through the mouth, and                            advanced to the second part of duodenum. The upper                            GI endoscopy was  accomplished without difficulty.                            The patient tolerated the procedure well. Scope In: Scope Out: Findings:                 The examined esophagus was normal with well-defined                            Z-line at 35 cm, examined by NBI.                           The entire examined stomach was normal. Biopsies                            were taken with a cold forceps for histology.                           The examined duodenum was normal. Biopsies for                            histology were taken with a cold forceps for                            evaluation of celiac disease. Complications:            No immediate complications. Estimated Blood Loss:     Estimated blood loss: none. Impression:               - Normal EGD Recommendation:           - Patient has a contact number available for                            emergencies. The signs and symptoms of potential                            delayed complications were discussed with the                            patient. Return to normal  activities tomorrow.                            Written discharge instructions were provided to the                            patient.                           - Resume previous diet.                           - Continue present medications.                           - Await pathology results.                           - Avoid aspirin, ibuprofen, naproxen, or other                            non-steroidal anti-inflammatory drugs.                           - The findings and recommendations were discussed                            with the patient's family. Lynann Bologna, MD 06/24/2020 3:28:01 PM This report has been signed electronically.

## 2020-06-24 NOTE — Op Note (Signed)
[facility] Patient Name: Rebecca Rocha Procedure Date: 06/24/2020 2:57 PM MRN: [id number] Endoscopist: Lynann Bologna , MD Age: 20 Referring MD:  Date of Birth: 02-Jun-2000 Gender: Female Account #: [id number] Procedure:                Colonoscopy Indications:              Rectal bleeding. Abdominal pain with negative CT                            Abdo/pelvis. Medicines:                Monitored Anesthesia Care Procedure:                Pre-Anesthesia Assessment:                           - Prior to the procedure, a History and Physical                            was performed, and patient medications and                            allergies were reviewed. The patient's tolerance of                            previous anesthesia was also reviewed. The risks                            and benefits of the procedure and the sedation                            options and risks were discussed with the patient.                            All questions were answered, and informed consent                            was obtained. Prior Anticoagulants: The patient has                            taken no previous anticoagulant or antiplatelet                            agents. ASA Grade Assessment: I - A normal, healthy                            patient. After reviewing the risks and benefits,                            the patient was deemed in satisfactory condition to                            undergo the procedure.  After obtaining informed consent, the colonoscope                            was passed under direct vision. Throughout the                            procedure, the patient's blood pressure, pulse, and                            oxygen saturations were monitored continuously. The                            Olympus PCF-H190DL (#[id number]) Colonoscope was                            introduced through the anus and advanced to the 2                             cm into the ileum. The colonoscopy was performed                            without difficulty. The patient tolerated the                            procedure well. The quality of the bowel                            preparation was good. The terminal ileum, ileocecal                            valve, appendiceal orifice, and rectum were                            photographed. Scope In: 3:11:18 PM Scope Out: 3:24:39 PM Scope Withdrawal Time: 0 hours 9 minutes 28 seconds  Total Procedure Duration: 0 hours 13 minutes 21 seconds  Findings:                 The colon (entire examined portion) appeared normal.                           Non-bleeding internal hemorrhoids were found during                            retroflexion. The hemorrhoids were small.                           The terminal ileum appeared normal.                           The exam was otherwise without abnormality on                            direct and retroflexion views. Complications:            No immediate complications. Estimated  Blood Loss:     Estimated blood loss: none. Impression:               -Minimal internal hemorrhoids.                           -Otherwise normal colonoscopy to TI.                           -No specimens collected. Recommendation:           - Patient has a contact number available for                            emergencies. The signs and symptoms of potential                            delayed complications were discussed with the                            patient. Return to normal activities tomorrow.                            Written discharge instructions were provided to the                            patient.                           - Resume previous diet.                           - Continue present medications.                           - Preparation H 1 twice daily after the bowel                            movement for 7 to 10 days as needed.                            - Repeat colonoscopy at age 64 for screening                            purposes. Certainly earlier, if with any new                            problems or change in family history.                           - Return to GI clinic PRN. I do believe that she                            has underlying IBS.                           - The findings and recommendations were  discussed                            with the patient's family. Lynann Bologna, MD 06/24/2020 3:31:30 PM This report has been signed electronically.

## 2020-06-24 NOTE — Patient Instructions (Signed)
Await pathology results.  Information on hemorrhoids given to you today.   Avoid NSAIDS (Aspirin, Ibuprofen, Aleve, Naproxen), you may use Tylenol as needed.  Resume previous diet and medications.  Use Preparation H twice a day after bowel movements for 7-10 days as needed.  Return to GI clinic as needed.  YOU HAD AN ENDOSCOPIC PROCEDURE TODAY AT THE [facility]:   Refer to the procedure report that was given to you for any specific questions about what was found during the examination.  If the procedure report does not answer your questions, please call your gastroenterologist to clarify.  If you requested that your care partner not be given the details of your procedure findings, then the procedure report has been included in a sealed envelope for you to review at your convenience later.  YOU SHOULD EXPECT: Some feelings of bloating in the abdomen. Passage of more gas than usual.  Walking can help get rid of the air that was put into your GI tract during the procedure and reduce the bloating. If you had a lower endoscopy (such as a colonoscopy or flexible sigmoidoscopy) you may notice spotting of blood in your stool or on the toilet paper. If you underwent a bowel prep for your procedure, you may not have a normal bowel movement for a few days.  Please Note:  You might notice some irritation and congestion in your nose or some drainage.  This is from the oxygen used during your procedure.  There is no need for concern and it should clear up in a day or so.  SYMPTOMS TO REPORT IMMEDIATELY:   Following lower endoscopy (colonoscopy or flexible sigmoidoscopy):  Excessive amounts of blood in the stool  Significant tenderness or worsening of abdominal pains  Swelling of the abdomen that is new, acute  Fever of 100F or higher   Following upper endoscopy (EGD)  Vomiting of blood or coffee ground material  New chest pain or pain under the shoulder blades  Painful or  persistently difficult swallowing  New shortness of breath  Fever of 100F or higher  Black, tarry-looking stools  For urgent or emergent issues, a gastroenterologist can be reached at any hour by calling [phone]. Do not use MyChart messaging for urgent concerns.    DIET:  We do recommend a small meal at first, but then you may proceed to your regular diet.  Drink plenty of fluids but you should avoid alcoholic beverages for 24 hours.  ACTIVITY:  You should plan to take it easy for the rest of today and you should NOT DRIVE or use heavy machinery until tomorrow (because of the sedation medicines used during the test).    FOLLOW UP: Our staff will call the number listed on your records 48-72 hours following your procedure to check on you and address any questions or concerns that you may have regarding the information given to you following your procedure. If we do not reach you, we will leave a message.  We will attempt to reach you two times.  During this call, we will ask if you have developed any symptoms of COVID 19. If you develop any symptoms (ie: fever, flu-like symptoms, shortness of breath, cough etc.) before then, please call [phone].  If you test positive for Covid 19 in the 2 weeks post procedure, please call and report this information to [place].    If any biopsies were taken you will be contacted by phone or by letter within the next  1-3 weeks.  Please call us at [phone] if you have not heard about the biopsies in 3 weeks.    SIGNATURES/CONFIDENTIALITY: You and/or your care partner have signed paperwork which will be entered into your electronic medical record.  These signatures attest to the fact that that the information above on your After Visit Summary has been reviewed and is understood.  Full responsibility of the confidentiality of this discharge information lies with you and/or your care-partner.

## 2020-06-24 NOTE — Progress Notes (Signed)
Called to room to assist during endoscopic procedure.  Patient ID and intended procedure confirmed with present staff. Received instructions for my participation in the procedure from the performing physician.  

## 2020-06-24 NOTE — Progress Notes (Signed)
Pt Drowsy. VSS. To PACU, report to RN. No anesthetic complications noted.  

## 2020-06-28 ENCOUNTER — Telehealth: Payer: Self-pay | Admitting: *Deleted

## 2020-06-28 NOTE — Telephone Encounter (Signed)
  Follow up Call-  Call back number 06/24/2020  Post procedure Call Back phone  # [phone]  Permission to leave phone message Yes  Some recent data might be hidden     Patient questions:  Do you have a fever, pain , or abdominal swelling? No. Pain Score  0 *  Have you tolerated food without any problems? Yes.    Have you been able to return to your normal activities? Yes.    Do you have any questions about your discharge instructions: Diet   No. Medications  No. Follow up visit  No.  Do you have questions or concerns about your Care? No.  Actions: * If pain score is 4 or above: No action needed, pain <4.   1. Have you developed a fever since your procedure? no  2.   Have you had an respiratory symptoms (SOB or cough) since your procedure? no  3.   Have you tested positive for COVID 19 since your procedure no  4.   Have you had any family members/close contacts diagnosed with the COVID 19 since your procedure?  no   If yes to any of these questions please route to Laverna Peace, RN and Karlton Lemon, RN

## 2020-07-04 ENCOUNTER — Encounter: Payer: Self-pay | Admitting: Gastroenterology

## 2020-07-08 ENCOUNTER — Ambulatory Visit: Payer: No Typology Code available for payment source | Admitting: Nurse Practitioner

## 2020-07-08 DIAGNOSIS — Z0289 Encounter for other administrative examinations: Secondary | ICD-10-CM

## 2020-07-19 ENCOUNTER — Encounter (HOSPITAL_BASED_OUTPATIENT_CLINIC_OR_DEPARTMENT_OTHER): Payer: Self-pay | Admitting: *Deleted

## 2020-07-19 ENCOUNTER — Emergency Department (HOSPITAL_BASED_OUTPATIENT_CLINIC_OR_DEPARTMENT_OTHER): Payer: 59

## 2020-07-19 ENCOUNTER — Other Ambulatory Visit: Payer: Self-pay

## 2020-07-19 ENCOUNTER — Emergency Department (HOSPITAL_BASED_OUTPATIENT_CLINIC_OR_DEPARTMENT_OTHER)
Admission: EM | Admit: 2020-07-19 | Discharge: 2020-07-19 | Disposition: A | Discharge status: Home / Self Care | Payer: 59 | Attending: Emergency Medicine | Admitting: Emergency Medicine

## 2020-07-19 DIAGNOSIS — J45909 Unspecified asthma, uncomplicated: Secondary | ICD-10-CM | POA: Insufficient documentation

## 2020-07-19 DIAGNOSIS — M549 Dorsalgia, unspecified: Secondary | ICD-10-CM | POA: Diagnosis not present

## 2020-07-19 DIAGNOSIS — Z7951 Long term (current) use of inhaled steroids: Secondary | ICD-10-CM | POA: Insufficient documentation

## 2020-07-19 DIAGNOSIS — H5371 Glare sensitivity: Secondary | ICD-10-CM | POA: Insufficient documentation

## 2020-07-19 DIAGNOSIS — S3992XA Unspecified injury of lower back, initial encounter: Secondary | ICD-10-CM | POA: Diagnosis present

## 2020-07-19 DIAGNOSIS — X58XXXA Exposure to other specified factors, initial encounter: Secondary | ICD-10-CM | POA: Insufficient documentation

## 2020-07-19 DIAGNOSIS — R519 Headache, unspecified: Secondary | ICD-10-CM | POA: Diagnosis not present

## 2020-07-19 DIAGNOSIS — S30810A Abrasion of lower back and pelvis, initial encounter: Secondary | ICD-10-CM | POA: Insufficient documentation

## 2020-07-19 DIAGNOSIS — Z8669 Personal history of other diseases of the nervous system and sense organs: Secondary | ICD-10-CM | POA: Insufficient documentation

## 2020-07-19 LAB — URINALYSIS, ROUTINE W REFLEX MICROSCOPIC
Bilirubin Urine: NEGATIVE
Glucose, UA: NEGATIVE mg/dL
Hgb urine dipstick: NEGATIVE
Ketones, ur: NEGATIVE mg/dL
Leukocytes,Ua: NEGATIVE
Nitrite: NEGATIVE
Protein, ur: NEGATIVE mg/dL
Specific Gravity, Urine: 1.025 (ref 1.005–1.030)
pH: 6 (ref 5.0–8.0)

## 2020-07-19 LAB — CBC WITH DIFFERENTIAL/PLATELET
Abs Immature Granulocytes: 0.05 10*3/uL (ref 0.00–0.07)
Basophils Absolute: 0.1 10*3/uL (ref 0.0–0.1)
Basophils Relative: 1 %
Eosinophils Absolute: 0.2 10*3/uL (ref 0.0–0.5)
Eosinophils Relative: 2 %
HCT: 38.9 % (ref 36.0–46.0)
Hemoglobin: 12.2 g/dL (ref 12.0–15.0)
Immature Granulocytes: 1 %
Lymphocytes Relative: 35 %
Lymphs Abs: 3.5 10*3/uL (ref 0.7–4.0)
MCH: 30.1 pg (ref 26.0–34.0)
MCHC: 31.4 g/dL (ref 30.0–36.0)
MCV: 96 fL (ref 80.0–100.0)
Monocytes Absolute: 0.8 10*3/uL (ref 0.1–1.0)
Monocytes Relative: 8 %
Neutro Abs: 5.5 10*3/uL (ref 1.7–7.7)
Neutrophils Relative %: 53 %
Platelets: 390 10*3/uL (ref 150–400)
RBC: 4.05 MIL/uL (ref 3.87–5.11)
RDW: 11.5 % (ref 11.5–15.5)
WBC: 10.1 10*3/uL (ref 4.0–10.5)
nRBC: 0 % (ref 0.0–0.2)

## 2020-07-19 LAB — BASIC METABOLIC PANEL
Anion gap: 8 (ref 5–15)
BUN: 15 mg/dL (ref 6–20)
CO2: 24 mmol/L (ref 22–32)
Calcium: 9 mg/dL (ref 8.9–10.3)
Chloride: 103 mmol/L (ref 98–111)
Creatinine, Ser: 0.64 mg/dL (ref 0.44–1.00)
GFR, Estimated: >60 mL/min (ref >60)
Glucose, Bld: 84 mg/dL (ref 70–99)
Potassium: 3.6 mmol/L (ref 3.5–5.1)
Sodium: 135 mmol/L (ref 135–145)

## 2020-07-19 LAB — PREGNANCY, URINE: Preg Test, Ur: NEGATIVE

## 2020-07-19 MED ORDER — MAGNESIUM SULFATE IN D5W 1-5 GM/100ML-% IV SOLN: 1.0000 g | Freq: Once | INTRAVENOUS | Status: DC

## 2020-07-19 MED ORDER — KETOROLAC TROMETHAMINE 30 MG/ML IJ SOLN: 30.0000 mg | Freq: Once | INTRAMUSCULAR | Status: DC

## 2020-07-19 MED ORDER — SODIUM CHLORIDE 0.9 % IV BOLUS
1000.0000 mL | Freq: Once | INTRAVENOUS | Status: AC
  Administered 2020-07-19: 1000 mL via INTRAVENOUS

## 2020-07-19 MED ORDER — TETRACAINE HCL 0.5 % OP SOLN
1.0000 [drp] | Freq: Once | OPHTHALMIC | Status: AC
  Administered 2020-07-19: 1 [drp] via OPHTHALMIC
  Filled 2020-07-19: qty 4

## 2020-07-19 MED ORDER — PROCHLORPERAZINE EDISYLATE 10 MG/2ML IJ SOLN
10.0000 mg | Freq: Once | INTRAMUSCULAR | Status: AC
  Administered 2020-07-19: 10 mg via INTRAVENOUS
  Filled 2020-07-19: qty 2

## 2020-07-19 MED ORDER — FLUORESCEIN SODIUM 1 MG OP STRP
1.0000 | ORAL_STRIP | Freq: Once | OPHTHALMIC | Status: AC
  Administered 2020-07-19: 1 via OPHTHALMIC
  Filled 2020-07-19: qty 1

## 2020-07-19 NOTE — ED Provider Notes (Signed)
[facility] EMERGENCY DEPARTMENT Provider Note   CSN: [id number] Arrival date & time: 07/19/20  1715     History No chief complaint on file.   Rebecca Rocha is a 20 y.o. female with PMH/o asthma, Depression, sinusitis, migraines who presents for evaluation of headache that has been ongoing for 2 days.  She reports that 2 nights ago, she was out with her friends.  She endorses drinking a lot of alcohol.  She states that she started developing a mild headache.  She states that not remember falling or any trauma.  She states that over the last 2 days, the headaches are progressively worsening.  She states is in the front and she feels like it goes behind her eyes.  She also feels like it goes back to her back of her head.  She states she has a history of migraines but normally they are crossed her head.  She states that this is a throbbing and pressure type headache.  She also reports having some back pain.  She states she noticed some scratches around her back but she does not remember how that occurred.  She also is also concerned that she left her contact in her right eye.  She does not know if she took it out and states that sometimes when she blinks it hurts.  She denies any fevers, urinary complaints, nausea/vomiting.  She has had some photosensitivity.  She denies any numbness/weakness of her arms or legs.  The history is provided by the patient.       Past Medical History:  Diagnosis Date  . Acute calculous cholecystitis 11/26/2019  . Anxiety   . Asthma   . Depression   . Menstrual migraine, intractable, without status migrainosus   . Panic attack   . Sinusitis   . Sleep apnea   . Syncope and collapse   . Vertigo of central origin, unspecified ear     Patient Active Problem List   Diagnosis Date Noted  . Insomnia 04/07/2020  . Hepatic lesion 04/07/2020  . S/P laparoscopic cholecystectomy 12/08/2019  . Chronic tension-type headache, not intractable  12/08/2019  . 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 Chronic upper back pain 09/18/2019  . Seizure-like activity (HCC) 01/08/2018  . 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 Asthma 10/27/2016    Past Surgical History:  Procedure Laterality Date  . CHOLECYSTECTOMY N/A 11/26/2019   Procedure: LAPAROSCOPIC CHOLECYSTECTOMY;  Surgeon: Rodman Pickle, MD;  Location: [facility];  Service: General;  Laterality: N/A;     OB History    Gravida  0   Para  0   Term  0   Preterm  0   AB  0   Living  0     SAB  0   IAB  0   Ectopic  0   Multiple  0   Live Births  0           Family History  Problem Relation Age of Onset  . Hypertension Mother   . Liver disease Mother   . Other Mother        GENETIC (INHERITED) DISORDER  . Cancer - Other Mother        breast mass-possible cancer?  Marland Kitchen Hypertension Father   . Diabetes Father   . Cancer Paternal Grandmother   . Diabetes Paternal Grandmother   . Hypertension Paternal Grandmother   . High Cholesterol Paternal Grandmother   . Cancer Paternal Grandfather   . Diabetes Paternal Grandfather   .  Hypertension Paternal Grandfather   . High Cholesterol Paternal Grandfather   . Anxiety disorder Sister   . Cancer Maternal Aunt   . Colon cancer Maternal Uncle   . Prostate cancer Paternal Uncle   . Esophageal cancer Neg Hx   . Rectal cancer Neg Hx   . Stomach cancer Neg Hx     Social History   Tobacco Use  . Smoking status: Never Smoker  . Smokeless tobacco: Never Used  Vaping Use  . Vaping Use: Never used  Substance Use Topics  . Alcohol use: Yes    Comment: RARE  . Drug use: No    Home Medications Prior to Admission medications   Medication Sig Start Date End Date Taking? Authorizing Provider  acetaminophen (TYLENOL) 500 MG tablet Take 1-2 tablets (500-1,000 mg total) by mouth every 6 (six) hours as needed for mild pain or moderate pain. 11/27/19 11/26/20  Adam PhenixSimaan, Elizabeth S, PA-C  albuterol (VENTOLIN HFA) 108 (90  Base) MCG/ACT inhaler 1-2 PUFFS EVERY 6 HOURS FOR WHEEZING OR SHORTNESS OF BREATH. NEED OFFICE VISIT FOR ADDITIONAL REFILL Patient taking differently: Inhale 1-2 puffs into the lungs every 6 (six) hours as needed. 03/22/20   Nche, Bonna Gainsharlotte Lum, NP  fluticasone (FLOVENT HFA) 110 MCG/ACT inhaler Inhale 1 puff into the lungs 2 (two) times daily. Rinse mouth after each use Patient taking differently: Inhale 1 puff into the lungs as needed. Rinse mouth after each use 03/01/20   Nche, Bonna Gainsharlotte Lum, NP  hyoscyamine (ANASPAZ) 0.125 MG TBDP disintergrating tablet Place 1 tablet (0.125 mg total) under the tongue every 6 (six) hours as needed for cramping. 04/20/20   Nche, Bonna Gainsharlotte Lum, NP  ibuprofen (ADVIL) 800 MG tablet Take 1 tablet (800 mg total) by mouth 3 (three) times daily. Patient taking differently: Take 800 mg by mouth every 8 (eight) hours as needed. 06/01/20   Wieters, Hallie C, PA-C  ondansetron (ZOFRAN ODT) 4 MG disintegrating tablet Take 1 tablet (4 mg total) by mouth every 8 (eight) hours as needed for nausea or vomiting. 03/13/20   Alvira MondaySchlossman, Erin, MD    Allergies    Patient has no known allergies.  Review of Systems   Review of Systems  Constitutional: Negative for fever.  Eyes: Positive for photophobia.  Cardiovascular: Negative for chest pain.  Gastrointestinal: Negative for nausea and vomiting.  Genitourinary: Negative for dysuria and hematuria.  Musculoskeletal: Positive for back pain.  Neurological: Positive for headaches. Negative for weakness and numbness.  All other systems reviewed and are negative.   Physical Exam Updated Vital Signs BP 121/73   Pulse 64   Temp 98.5 F (36.9 C) (Oral)   Resp 18   Ht 5\' 1"  (1.549 m)   Wt 64.9 kg   LMP 07/05/2020   SpO2 100%   BMI 27.03 kg/m   Physical Exam Vitals and nursing note reviewed.  Constitutional:      Appearance: She is well-developed and well-nourished.  HENT:     Head: Normocephalic and atraumatic.  Eyes:      General: No scleral icterus.       Right eye: No discharge.        Left eye: No discharge.     Extraocular Movements: EOM normal.     Conjunctiva/sclera: Conjunctivae normal.     Comments: PERRL. EOMs intact. No nystagmus. No neglect.  Right lid everted.  No evidence of contact.  No conjunctival injection noted bilaterally.  Neck:     Comments: Neck is supple and without  rigidity. FROM without pain.  Pulmonary:     Effort: Pulmonary effort is normal.  Musculoskeletal:     Comments: Diffuse tenderness noted to the lower back.  No deformity or crepitus noted.  Skin:    General: Skin is warm and dry.     Comments: Scattered abrasions noted to the lower back.  Neurological:     Mental Status: She is alert.     Comments: Cranial nerves III-XII intact Follows commands, Moves all extremities  5/5 strength to BUE and BLE  Sensation intact throughout all major nerve distributions No slurred speech. No facial droop.   Psychiatric:        Mood and Affect: Mood and affect normal.        Speech: Speech normal.        Behavior: Behavior normal.     ED Results / Procedures / Treatments   Labs (all labs ordered are listed, but only abnormal results are displayed) Labs Reviewed  URINALYSIS, ROUTINE W REFLEX MICROSCOPIC  PREGNANCY, URINE  CBC WITH DIFFERENTIAL/PLATELET  BASIC METABOLIC PANEL    EKG None  Radiology DG Lumbar Spine Complete  Result Date: 07/19/2020 CLINICAL DATA:  Back pain EXAM: LUMBAR SPINE - COMPLETE 4+ VIEW COMPARISON:  None. FINDINGS: There is no evidence of lumbar spine fracture. Alignment is normal. Intervertebral disc spaces are maintained. IMPRESSION: Negative. Electronically Signed   By: Katherine Mantle M.D.   On: 07/19/2020 21:01   CT Head Wo Contrast  Result Date: 07/19/2020 CLINICAL DATA:  New or worsening headache, ocular pain for 2 days EXAM: CT HEAD WITHOUT CONTRAST TECHNIQUE: Contiguous axial images were obtained from the base of the skull through the  vertex without intravenous contrast. COMPARISON:  10/04/2017 FINDINGS: Brain: No acute infarct or hemorrhage. Lateral ventricles and midline structures are unremarkable. No acute extra-axial fluid collections. No mass effect. Vascular: No hyperdense vessel or unexpected calcification. Skull: Normal. Negative for fracture or focal lesion. Sinuses/Orbits: No acute finding. Other: None. IMPRESSION: 1. No acute intracranial process. Electronically Signed   By: Sharlet Salina M.D.   On: 07/19/2020 21:13    Procedures Procedures   Medications Ordered in ED Medications  sodium chloride 0.9 % bolus 1,000 mL ( Intravenous Stopped 07/19/20 2303)  prochlorperazine (COMPAZINE) injection 10 mg (10 mg Intravenous Given 07/19/20 2159)  tetracaine (PONTOCAINE) 0.5 % ophthalmic solution 1 drop (1 drop Both Eyes Given by Other 07/19/20 2317)  fluorescein ophthalmic strip 1 strip (1 strip Both Eyes Given by Other 07/19/20 2317)    ED Course  I have reviewed the triage vital signs and the nursing notes.  Pertinent labs & imaging results that were available during my care of the patient were reviewed by me and considered in my medical decision making (see chart for details).    MDM Rules/Calculators/A&P                          20 y.o. F who presents for evaluation of headache that is been ongoing for 2 days.  She reports headache started by mildly and progressively worsened.  She does not think she had any preceding trauma, injury.  She does report she gets migraines but states this feels slightly different than her migraines.  No fevers.  She has had some associated photosensitivity.  No numbness/weakness of arms or legs.  On initial arrival, she is afebrile, toxic appearing. No neuro deficits. Patient is afebrile, non-toxic appearing, sitting comfortably on examination table. Vital signs  reviewed and stable.  No neuro deficits noted on exam.  Neck is supple and without rigidity.  Question if this is migraine.  She does  report that she drank a lot of alcohol over the weekend.  Because she does not remember exactly what happened and if she fell in because she states this feels slightly different than her migraines, we will plan for imaging.  We will also give migraine cocktail and reassess. History/physical exam is not concerning for meningitis, dural venous thrombosis.   CBC shows no leukocytosis.  Hemoglobin stable.  Urine is negative.  Urine pregnancy is negative.  CT head negative for any acute abnormalities.  Lumbar spine negative for any acute abnormalities.  Evaluation of her eye shows no evidence of conjunctival injection.  Evaluation with Joseph Art lamp shows no evidence of forcing uptake, corneal abrasion, Seidel sign.  I inverted both the superior and inferior lids again with a sterile Q-tip and evaluated under full range of motion and did not see any evidence of contact.  Given that patient has no evidence of corneal abrasion, do not feel like there is a contact secondary.  Patient had told the nurse that she was still having some slight headache.  I discussed with her giving her additional medications.  Patient has been ambulatory in the ED. patient states she feels good enough to go home and she would rather not have any more medications here in the ED.  Encouraged at home supportive care measures. At this time, patient exhibits no emergent life-threatening condition that require further evaluation in ED. Discussed patient with Dr. Madilyn Hook who is agreeable to plan. Patient had ample opportunity for questions and discussion. All patient's questions were answered with full understanding. Strict return precautions discussed. Patient expresses understanding and agreement to plan.   Portions of this note were generated with Scientist, clinical (histocompatibility and immunogenetics). Dictation errors may occur despite best attempts at proofreading.  Final Clinical Impression(s) / ED Diagnoses Final diagnoses:  Acute nonintractable headache,  unspecified headache type    Rx / DC Orders ED Discharge Orders    None       Rosana Hoes 07/20/20 2303    Tilden Fossa, MD 07/21/20 1452

## 2020-07-19 NOTE — ED Triage Notes (Signed)
Headache, back pain, and her eyes hurt x 2 days. She has been taking Ibuprofen 800 mg.

## 2020-07-19 NOTE — Discharge Instructions (Signed)
As we discussed, your work-up was reassuring today.  Make sure you are staying hydrated drink plenty of fluids.  Return to emergency department for any worsening headache, vision changes, nausea/vomiting, numbness/weakness in arms or legs or any other worsening concerning symptoms.

## 2020-07-19 NOTE — ED Notes (Signed)
See EDP assessment 

## 2020-08-02 ENCOUNTER — Telehealth: Payer: Self-pay | Admitting: Nurse Practitioner

## 2020-08-02 ENCOUNTER — Encounter: Payer: Self-pay | Admitting: Nurse Practitioner

## 2020-08-02 NOTE — Telephone Encounter (Signed)
Pt was no show for appt 07/08/20. 2nd occurrence (03/12/20 & 07/08/20) Letter mailed.  PCP,  Please reply back with corresponding letter matching appropriate follow up needs.  A - No follow up necessary B - Follow up urgent - locate patient immediately to schedule appointment. C - Follow up necessary. Contact patient and schedule visit w/in 7 days. D - Follow up necessary. Contact patient and schedule visit w/in 2-4 weeks.  E - Follow up necessary. Contact patient and schedule visit w/in 3 months.

## 2020-08-02 NOTE — Telephone Encounter (Signed)
D

## 2020-08-05 NOTE — Telephone Encounter (Signed)
Scheduled for 08/20/20 8:45a

## 2020-08-19 ENCOUNTER — Other Ambulatory Visit: Payer: Self-pay

## 2020-08-20 ENCOUNTER — Ambulatory Visit (INDEPENDENT_AMBULATORY_CARE_PROVIDER_SITE_OTHER): Payer: 59 | Admitting: Nurse Practitioner

## 2020-08-20 ENCOUNTER — Encounter: Payer: Self-pay | Admitting: Nurse Practitioner

## 2020-08-20 VITALS — BP 120/76 | HR 82 | Temp 98.0°F | Ht 64.0 in | Wt 146.2 lb

## 2020-08-20 DIAGNOSIS — G4700 Insomnia, unspecified: Secondary | ICD-10-CM

## 2020-08-20 DIAGNOSIS — F321 Major depressive disorder, single episode, moderate: Secondary | ICD-10-CM | POA: Insufficient documentation

## 2020-08-20 DIAGNOSIS — F39 Unspecified mood [affective] disorder: Secondary | ICD-10-CM | POA: Diagnosis not present

## 2020-08-20 DIAGNOSIS — F339 Major depressive disorder, recurrent, unspecified: Secondary | ICD-10-CM | POA: Insufficient documentation

## 2020-08-20 MED ORDER — AMITRIPTYLINE HCL 25 MG PO TABS: 25.0000 mg | ORAL_TABLET | Freq: Every day | ORAL | 5 refills | Status: DC

## 2020-08-20 NOTE — Assessment & Plan Note (Signed)
Chronic but worse in last 3weeks She started counseling session with therapist, has weekly sessions. Denies any plan of self harm, denies any access to weapons. No Hx of previous suicide attempt or self mutilation or  IVC FHx of suicide death (maternal uncle) and suicide attempt (sister).  She is able to give verbal safety contract. Also provided phone number and address of [facility] in [facility]. Start elavil F/up in 2weeks

## 2020-08-20 NOTE — Patient Instructions (Signed)
Keep appts with therapist. If suicidal thoughts become pervasive call 911 or [phone] or [phone] Or go to walk in clinic on [address], [place], [place]  Amitriptyline tablets What is this medicine? AMITRIPTYLINE (a mee TRIP ti leen) is used to treat depression. This medicine may be used for other purposes; ask your health care provider or pharmacist if you have questions. COMMON BRAND NAME(S): Elavil, Vanatrip What should I tell my health care provider before I take this medicine? They need to know if you have any of these conditions:  an alcohol problem  asthma, difficulty breathing  bipolar disorder or schizophrenia  difficulty passing urine, prostate trouble  glaucoma  heart disease or previous heart attack  liver disease  over active thyroid  seizures  thoughts or plans of suicide, a previous suicide attempt, or family history of suicide attempt  an unusual or allergic reaction to amitriptyline, other medicines, foods, dyes, or preservatives  pregnant or trying to get pregnant  breast-feeding How should I use this medicine? Take this medicine by mouth with a drink of water. Follow the directions on the prescription label. You can take the tablets with or without food. Take your medicine at regular intervals. Do not take it more often than directed. Do not stop taking this medicine suddenly except upon the advice of your doctor. Stopping this medicine too quickly may cause serious side effects or your condition may worsen. A special MedGuide will be given to you by the pharmacist with each prescription and refill. Be sure to read this information carefully each time. Talk to your pediatrician regarding the use of this medicine in children. Special care may be needed. Overdosage: If you think you have taken too much of this medicine contact a poison control center or emergency room at once. NOTE: This medicine is only for you. Do not share this medicine with  others. What if I miss a dose? If you miss a dose, take it as soon as you can. If it is almost time for your next dose, take only that dose. Do not take double or extra doses. What may interact with this medicine? Do not take this medicine with any of the following medications:  arsenic trioxide  certain medicines used to regulate abnormal heartbeat or to treat other heart conditions  cisapride  droperidol  halofantrine  linezolid  MAOIs like Carbex, Eldepryl, Marplan, Nardil, and Parnate  methylene blue  other medicines for mental depression  phenothiazines like perphenazine, thioridazine and chlorpromazine  pimozide  probucol  procarbazine  sparfloxacin  St. John's Wort This medicine may also interact with the following medications:  atropine and related drugs like hyoscyamine, scopolamine, tolterodine and others  barbiturate medicines for inducing sleep or treating seizures, like phenobarbital  cimetidine  disulfiram  ethchlorvynol  thyroid hormones such as levothyroxine  ziprasidone This list may not describe all possible interactions. Give your health care provider a list of all the medicines, herbs, non-prescription drugs, or dietary supplements you use. Also tell them if you smoke, drink alcohol, or use illegal drugs. Some items may interact with your medicine. What should I watch for while using this medicine? Tell your doctor if your symptoms do not get better or if they get worse. Visit your doctor or health care professional for regular checks on your progress. Because it may take several weeks to see the full effects of this medicine, it is important to continue your treatment as prescribed by your doctor. Patients and  their families should watch out for new or worsening thoughts of suicide or depression. Also watch out for sudden changes in feelings such as feeling anxious, agitated, panicky, irritable, hostile, aggressive, impulsive, severely  restless, overly excited and hyperactive, or not being able to sleep. If this happens, especially at the beginning of treatment or after a change in dose, call your health care professional. Bonita Quin may get drowsy or dizzy. Do not drive, use machinery, or do anything that needs mental alertness until you know how this medicine affects you. Do not stand or sit up quickly, especially if you are an older patient. This reduces the risk of dizzy or fainting spells. Alcohol may interfere with the effect of this medicine. Avoid alcoholic drinks. Do not treat yourself for coughs, colds, or allergies without asking your doctor or health care professional for advice. Some ingredients can increase possible side effects. Your mouth may get dry. Chewing sugarless gum or sucking hard candy, and drinking plenty of water will help. Contact your doctor if the problem does not go away or is severe. This medicine may cause dry eyes and blurred vision. If you wear contact lenses you may feel some discomfort. Lubricating drops may help. See your eye doctor if the problem does not go away or is severe. This medicine can cause constipation. Try to have a bowel movement at least every 2 to 3 days. If you do not have a bowel movement for 3 days, call your doctor or health care professional. This medicine can make you more sensitive to the sun. Keep out of the sun. If you cannot avoid being in the sun, wear protective clothing and use sunscreen. Do not use sun lamps or tanning beds/booths. What side effects may I notice from receiving this medicine? Side effects that you should report to your doctor or health care professional as soon as possible:  allergic reactions like skin rash, itching or hives, swelling of the face, lips, or tongue  anxious  breathing problems  changes in vision  confusion  elevated mood, decreased need for sleep, racing thoughts, impulsive behavior  eye pain  fast, irregular heartbeat  feeling  faint or lightheaded, falls  feeling agitated, angry, or irritable  fever with increased sweating  hallucination, loss of contact with reality  seizures  stiff muscles  suicidal thoughts or other mood changes  tingling, pain, or numbness in the feet or hands  trouble passing urine or change in the amount of urine  trouble sleeping  unusually weak or tired  vomiting  yellowing of the eyes or skin Side effects that usually do not require medical attention (report to your doctor or health care professional if they continue or are bothersome):  change in sex drive or performance  change in appetite or weight  constipation  dizziness  dry mouth  nausea  tired  tremors  upset stomach This list may not describe all possible side effects. Call your doctor for medical advice about side effects. You may report side effects to FDA at 1-800-FDA-1088. Where should I keep my medicine? Keep out of the reach of children. Store at room temperature between 20 and 25 degrees C (68 and 77 degrees F). Throw away any unused medicine after the expiration date. NOTE: This sheet is a summary. It may not cover all possible information. If you have questions about this medicine, talk to your doctor, pharmacist, or health care provider.  2021 Elsevier/Gold Standard (2018-04-23 13:04:32)

## 2020-08-20 NOTE — Assessment & Plan Note (Signed)
No improvement with restoril

## 2020-08-20 NOTE — Progress Notes (Signed)
Subjective:  Patient ID: Rebecca Rocha, female    DOB: 2000-09-30  Age: 20 y.o. MRN: [id number]  CC: Follow-up (3 month f/u on Insomnia, pt states medication did not help. Pt also would like to discuss anxiety and depression, she has recently started seeing a therapist (3 weeks) and feels she may need medication. )  HPI  Insomnia No improvement with restoril  Mood disorder (HCC) Chronic but worse in last 3weeks She started counseling session with therapist, has weekly sessions. Denies any plan of self harm, denies any access to weapons. No Hx of previous suicide attempt or self mutilation or  IVC FHx of suicide death (maternal uncle) and suicide attempt (sister).  She is able to give verbal safety contract. Also provided phone number and address of [facility] in clinic. Start elavil F/up in 2weeks   Reviewed past Medical, Social and Family history today.  Outpatient Medications Prior to Visit  Medication Sig Dispense Refill  . acetaminophen (TYLENOL) 500 MG tablet Take 1-2 tablets (500-1,000 mg total) by mouth every 6 (six) hours as needed for mild pain or moderate pain. 100 tablet 2  . albuterol (VENTOLIN HFA) 108 (90 Base) MCG/ACT inhaler 1-2 PUFFS EVERY 6 HOURS FOR WHEEZING OR SHORTNESS OF BREATH. NEED OFFICE VISIT FOR ADDITIONAL REFILL (Patient taking differently: Inhale 1-2 puffs into the lungs every 6 (six) hours as needed.) 6.7 each 1  . fluticasone (FLOVENT HFA) 110 MCG/ACT inhaler Inhale 1 puff into the lungs 2 (two) times daily. Rinse mouth after each use (Patient taking differently: Inhale 1 puff into the lungs as needed. Rinse mouth after each use) 1 each 2  . hyoscyamine (ANASPAZ) 0.125 MG TBDP disintergrating tablet Place 1 tablet (0.125 mg total) under the tongue every 6 (six) hours as needed for cramping. 90 tablet 2  . ibuprofen (ADVIL) 800 MG tablet Take 1 tablet (800 mg total) by mouth 3 (three) times daily. (Patient taking  differently: Take 800 mg by mouth every 8 (eight) hours as needed.) 21 tablet 0  . ondansetron (ZOFRAN ODT) 4 MG disintegrating tablet Take 1 tablet (4 mg total) by mouth every 8 (eight) hours as needed for nausea or vomiting. 20 tablet 0   No facility-administered medications prior to visit.    ROS See HPI  Objective:  BP 120/76 (BP Location: Left Arm, Patient Position: Sitting, Cuff Size: Normal)   Pulse 82   Temp 98 F (36.7 C) (Temporal)   Ht 5\' 4"  (1.626 m)   Wt 146 lb 3.2 oz (66.3 kg)   SpO2 95%   BMI 25.10 kg/m   Physical Exam Neurological:     Mental Status: She is alert and oriented to person, place, and time.  Psychiatric:        Attention and Perception: Attention normal.        Mood and Affect: Mood is depressed.        Speech: Speech normal.        Behavior: Behavior is cooperative.        Thought Content: Thought content includes suicidal ideation. Thought content does not include homicidal ideation. Thought content does not include homicidal or suicidal plan.        Cognition and Memory: Cognition normal.        Judgment: Judgment normal.     Comments: "though she is better off dead"    Assessment & Plan:  This visit occurred during the SARS-CoV-2 public health emergency.  Safety protocols were in place, including  screening questions prior to the visit, additional usage of staff PPE, and extensive cleaning of exam room while observing appropriate contact time as indicated for disinfecting solutions.   Rhema was seen today for follow-up.  Diagnoses and all orders for this visit:  Mood disorder (HCC) -     amitriptyline (ELAVIL) 25 MG tablet; Take 1 tablet (25 mg total) by mouth at bedtime.  Insomnia, unspecified type    Problem List Items Addressed This Visit      Other   Insomnia    No improvement with restoril      Mood disorder (HCC) - Primary    Chronic but worse in last 3weeks She started counseling session with therapist, has weekly  sessions. Denies any plan of self harm, denies any access to weapons. No Hx of previous suicide attempt or self mutilation or  IVC FHx of suicide death (maternal uncle) and suicide attempt (sister).  She is able to give verbal safety contract. Also provided phone number and address of [place] [facility]Walk in clinic. Start elavil F/up in 2weeks      Relevant Medications   amitriptyline (ELAVIL) 25 MG tablet      Follow-up: Return in about 4 weeks (around 09/17/2020) for anxiety and depression ( ).  Alysia Penna, NP

## 2020-09-06 ENCOUNTER — Encounter (HOSPITAL_BASED_OUTPATIENT_CLINIC_OR_DEPARTMENT_OTHER): Payer: Self-pay | Admitting: Emergency Medicine

## 2020-09-06 ENCOUNTER — Other Ambulatory Visit: Payer: Self-pay

## 2020-09-06 ENCOUNTER — Emergency Department (HOSPITAL_BASED_OUTPATIENT_CLINIC_OR_DEPARTMENT_OTHER)
Admission: EM | Admit: 2020-09-06 | Discharge: 2020-09-06 | Disposition: A | Discharge status: Home / Self Care | Payer: 59 | Attending: Emergency Medicine | Admitting: Emergency Medicine

## 2020-09-06 DIAGNOSIS — R1012 Left upper quadrant pain: Secondary | ICD-10-CM | POA: Insufficient documentation

## 2020-09-06 DIAGNOSIS — R519 Headache, unspecified: Secondary | ICD-10-CM | POA: Insufficient documentation

## 2020-09-06 DIAGNOSIS — Z7951 Long term (current) use of inhaled steroids: Secondary | ICD-10-CM | POA: Diagnosis not present

## 2020-09-06 DIAGNOSIS — R110 Nausea: Secondary | ICD-10-CM | POA: Insufficient documentation

## 2020-09-06 DIAGNOSIS — J45909 Unspecified asthma, uncomplicated: Secondary | ICD-10-CM | POA: Diagnosis not present

## 2020-09-06 LAB — COMPREHENSIVE METABOLIC PANEL
ALT: 18 U/L (ref 0–44)
AST: 19 U/L (ref 15–41)
Albumin: 4.3 g/dL (ref 3.5–5.0)
Alkaline Phosphatase: 63 U/L (ref 38–126)
Anion gap: 4 — ABNORMAL LOW (ref 5–15)
BUN: 10 mg/dL (ref 6–20)
CO2: 29 mmol/L (ref 22–32)
Calcium: 9.5 mg/dL (ref 8.9–10.3)
Chloride: 103 mmol/L (ref 98–111)
Creatinine, Ser: 0.68 mg/dL (ref 0.44–1.00)
GFR, Estimated: >60 mL/min (ref >60)
Glucose, Bld: 87 mg/dL (ref 70–99)
Potassium: 3.6 mmol/L (ref 3.5–5.1)
Sodium: 136 mmol/L (ref 135–145)
Total Bilirubin: 0.5 mg/dL (ref 0.3–1.2)
Total Protein: 7.8 g/dL (ref 6.5–8.1)

## 2020-09-06 LAB — CBC
HCT: 41.2 % (ref 36.0–46.0)
Hemoglobin: 12.7 g/dL (ref 12.0–15.0)
MCH: 29.7 pg (ref 26.0–34.0)
MCHC: 30.8 g/dL (ref 30.0–36.0)
MCV: 96.5 fL (ref 80.0–100.0)
Platelets: 407 10*3/uL — ABNORMAL HIGH (ref 150–400)
RBC: 4.27 MIL/uL (ref 3.87–5.11)
RDW: 11.7 % (ref 11.5–15.5)
WBC: 8.4 10*3/uL (ref 4.0–10.5)
nRBC: 0 % (ref 0.0–0.2)

## 2020-09-06 LAB — URINALYSIS, ROUTINE W REFLEX MICROSCOPIC
Bilirubin Urine: NEGATIVE
Glucose, UA: NEGATIVE mg/dL
Ketones, ur: NEGATIVE mg/dL
Leukocytes,Ua: NEGATIVE
Nitrite: NEGATIVE
Protein, ur: NEGATIVE mg/dL
RBC / HPF: >50 RBC/hpf — ABNORMAL HIGH (ref 0–5)
Specific Gravity, Urine: 1.009 (ref 1.005–1.030)
pH: 6 (ref 5.0–8.0)

## 2020-09-06 LAB — PREGNANCY, URINE: Preg Test, Ur: NEGATIVE

## 2020-09-06 LAB — LIPASE, BLOOD: Lipase: 17 U/L (ref 11–51)

## 2020-09-06 MED ORDER — DIPHENHYDRAMINE HCL 50 MG/ML IJ SOLN
12.5000 mg | Freq: Once | INTRAMUSCULAR | Status: AC
  Administered 2020-09-06: 12.5 mg via INTRAVENOUS
  Filled 2020-09-06: qty 1

## 2020-09-06 MED ORDER — ACETAMINOPHEN 325 MG PO TABS
650.0000 mg | ORAL_TABLET | Freq: Once | ORAL | Status: AC
  Administered 2020-09-06: 650 mg via ORAL
  Filled 2020-09-06: qty 2

## 2020-09-06 MED ORDER — PROMETHAZINE HCL 25 MG PO TABS: 25.0000 mg | ORAL_TABLET | Freq: Four times a day (QID) | ORAL | 0 refills | Status: DC | PRN

## 2020-09-06 MED ORDER — PROCHLORPERAZINE EDISYLATE 10 MG/2ML IJ SOLN
10.0000 mg | Freq: Once | INTRAMUSCULAR | Status: AC
  Administered 2020-09-06: 10 mg via INTRAVENOUS
  Filled 2020-09-06: qty 2

## 2020-09-06 NOTE — ED Triage Notes (Signed)
Pt reports headache and abd pain today. She went to [facility] and was sent here after reporting that she has taken 4800mg  ibuprofen in the last 12 hours.

## 2020-09-06 NOTE — Discharge Instructions (Signed)
Follow-up with your primary care doctor.  Would recommend holding off on further doses of Motrin for now.  Take Tylenol for pain control.  For nausea, can take the prescribed Phenergan as needed.

## 2020-09-06 NOTE — ED Provider Notes (Signed)
[facility] EMERGENCY DEPT Provider Note   CSN: [id number] Arrival date & time: 09/06/20  1809     History Chief Complaint  Patient presents with  . Headache  . Abdominal Pain    Rebecca Rocha is a 20 y.o. female.  Presents to ER with concern for headache.  Patient reports that she suffers from migraines, has suffered from migraines for many years.  Follows with her primary care doctor.  States that normally gets better after taking ibuprofen however after taking ibuprofen today the headache persisted.  Not sudden onset.  Currently severe.  States that after taking Motrin she also noted some pain in her upper abdomen.  No nausea or vomiting.  Abdominal pain is currently mild to moderate.  Isolated to her upper and left upper abdomen.  HPI     Past Medical History:  Diagnosis Date  . Acute calculous cholecystitis 11/26/2019  . Anxiety   . Asthma   . Depression   . Menstrual migraine, intractable, without status migrainosus   . Panic attack   . Sinusitis   . Sleep apnea   . Syncope and collapse   . Vertigo of central origin, unspecified ear     Patient Active Problem List   Diagnosis Date Noted  . Mood disorder (HCC) 08/20/2020  . Insomnia 04/07/2020  . Hepatic lesion 04/07/2020  . S/P laparoscopic cholecystectomy 12/08/2019  . Chronic tension-type headache, not intractable 12/08/2019  . 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 Chronic upper back pain 09/18/2019  . Seizure-like activity (HCC) 01/08/2018  . 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 Asthma 10/27/2016    Past Surgical History:  Procedure Laterality Date  . CHOLECYSTECTOMY N/A 11/26/2019   Procedure: LAPAROSCOPIC CHOLECYSTECTOMY;  Surgeon: Rodman Pickle, MD;  Location: [facility] ORS;  Service: General;  Laterality: N/A;     OB History    Gravida  0   Para  0   Term  0   Preterm  0   AB  0   Living  0     SAB  0   IAB  0   Ectopic  0   Multiple  0   Live Births   0           Family History  Problem Relation Age of Onset  . Hypertension Mother   . Liver disease Mother   . Other Mother        GENETIC (INHERITED) DISORDER  . Cancer - Other Mother        breast mass-possible cancer?  Marland Kitchen Hypertension Father   . Diabetes Father   . Cancer Paternal Grandmother   . Diabetes Paternal Grandmother   . Hypertension Paternal Grandmother   . High Cholesterol Paternal Grandmother   . Cancer Paternal Grandfather   . Diabetes Paternal Grandfather   . Hypertension Paternal Grandfather   . High Cholesterol Paternal Grandfather   . Anxiety disorder Sister   . Cancer Maternal Aunt   . Colon cancer Maternal Uncle   . Prostate cancer Paternal Uncle   . Esophageal cancer Neg Hx   . Rectal cancer Neg Hx   . Stomach cancer Neg Hx     Social History   Tobacco Use  . Smoking status: Never Smoker  . Smokeless tobacco: Never Used  Vaping Use  . Vaping Use: Never used  Substance Use Topics  . Alcohol use: Yes    Comment: RARE  . Drug use: No    Home Medications Prior to Admission medications  Medication Sig Start Date End Date Taking? Authorizing Provider  promethazine (PHENERGAN) 25 MG tablet Take 1 tablet (25 mg total) by mouth every 6 (six) hours as needed for nausea or vomiting. 09/06/20  Yes Lucita Montoya, Quitman Livings, MD  acetaminophen (TYLENOL) 500 MG tablet Take 1-2 tablets (500-1,000 mg total) by mouth every 6 (six) hours as needed for mild pain or moderate pain. 11/27/19 11/26/20  Adam Phenix, PA-C  albuterol (VENTOLIN HFA) 108 (90 Base) MCG/ACT inhaler 1-2 PUFFS EVERY 6 HOURS FOR WHEEZING OR SHORTNESS OF BREATH. NEED OFFICE VISIT FOR ADDITIONAL REFILL Patient taking differently: Inhale 1-2 puffs into the lungs every 6 (six) hours as needed. 03/22/20   Nche, Bonna Gains, NP  amitriptyline (ELAVIL) 25 MG tablet Take 1 tablet (25 mg total) by mouth at bedtime. 08/20/20   Nche, Bonna Gains, NP  fluticasone (FLOVENT HFA) 110 MCG/ACT inhaler  Inhale 1 puff into the lungs 2 (two) times daily. Rinse mouth after each use Patient taking differently: Inhale 1 puff into the lungs as needed. Rinse mouth after each use 03/01/20   Nche, Bonna Gains, NP  hyoscyamine (ANASPAZ) 0.125 MG TBDP disintergrating tablet Place 1 tablet (0.125 mg total) under the tongue every 6 (six) hours as needed for cramping. 04/20/20   Nche, Bonna Gains, NP  ibuprofen (ADVIL) 800 MG tablet Take 1 tablet (800 mg total) by mouth 3 (three) times daily. Patient taking differently: Take 800 mg by mouth every 8 (eight) hours as needed. 06/01/20   Wieters, Hallie C, PA-C  ondansetron (ZOFRAN ODT) 4 MG disintegrating tablet Take 1 tablet (4 mg total) by mouth every 8 (eight) hours as needed for nausea or vomiting. 03/13/20   Alvira Monday, MD    Allergies    Patient has no known allergies.  Review of Systems   Review of Systems  Constitutional: Negative for chills and fever.  HENT: Negative for ear pain and sore throat.   Eyes: Negative for pain and visual disturbance.  Respiratory: Negative for cough and shortness of breath.   Cardiovascular: Negative for chest pain and palpitations.  Gastrointestinal: Positive for abdominal pain and nausea. Negative for vomiting.  Genitourinary: Negative for dysuria and hematuria.  Musculoskeletal: Negative for arthralgias and back pain.  Skin: Negative for color change and rash.  Neurological: Positive for headaches. Negative for seizures and syncope.  All other systems reviewed and are negative.   Physical Exam Updated Vital Signs BP 125/67 (BP Location: Right Arm)   Pulse 96   Temp 98.5 F (36.9 C) (Oral)   Resp 17   Ht 5\' 1"  (1.549 m)   Wt 65.8 kg   LMP 09/05/2020   SpO2 97%   BMI 27.40 kg/m   Physical Exam Vitals and nursing note reviewed.  Constitutional:      General: She is not in acute distress.    Appearance: She is well-developed.  HENT:     Head: Normocephalic and atraumatic.  Eyes:      Conjunctiva/sclera: Conjunctivae normal.  Cardiovascular:     Rate and Rhythm: Normal rate and regular rhythm.     Heart sounds: No murmur heard.   Pulmonary:     Effort: Pulmonary effort is normal. No respiratory distress.     Breath sounds: Normal breath sounds.  Abdominal:     Palpations: Abdomen is soft.     Tenderness: There is no abdominal tenderness.     Comments: Tenderness to palpation throughout  Musculoskeletal:     Cervical back: Neck supple.  Skin:    General: Skin is warm and dry.  Neurological:     Mental Status: She is alert.     Comments: AAOx3 CN 2-12 intact, speech clear visual fields intact 5/5 strength in b/l UE and LE Sensation to light touch intact in b/l UE and LE Normal FNF Normal gait  Psychiatric:        Mood and Affect: Mood normal.     ED Results / Procedures / Treatments   Labs (all labs ordered are listed, but only abnormal results are displayed) Labs Reviewed  COMPREHENSIVE METABOLIC PANEL - Abnormal; Notable for the following components:      Result Value   Anion gap 4 (*)    All other components within normal limits  CBC - Abnormal; Notable for the following components:   Platelets 407 (*)    All other components within normal limits  URINALYSIS, ROUTINE W REFLEX MICROSCOPIC - Abnormal; Notable for the following components:   Color, Urine COLORLESS (*)    Hgb urine dipstick LARGE (*)    RBC / HPF >50 (*)    All other components within normal limits  LIPASE, BLOOD  PREGNANCY, URINE    EKG None  Radiology No results found.  Procedures Procedures   Medications Ordered in ED Medications  prochlorperazine (COMPAZINE) injection 10 mg (10 mg Intravenous Given 09/06/20 2132)  diphenhydrAMINE (BENADRYL) injection 12.5 mg (12.5 mg Intravenous Given 09/06/20 2132)  acetaminophen (TYLENOL) tablet 650 mg (650 mg Oral Given 09/06/20 2124)    ED Course  I have reviewed the triage vital signs and the nursing notes.  Pertinent labs &  imaging results that were available during my care of the patient were reviewed by me and considered in my medical decision making (see chart for details).    MDM Rules/Calculators/A&P                         20 year old presents to ER with chief complaint of headache, has associated abdominal pain and nausea.  On physical exam, patient is well-appearing.  Normal neurologic exam, given prior history, not sudden onset, normal neuro, suspect migraine versus tension type headache and doubt acute intracranial process.  Her abdomen was soft on serial exam.  Suspect pain and nausea may be related to headache or related to large quantity of Motrin taken earlier today.  Symptoms greatly improved after receiving headache cocktail.  Recommend she pause use of Motrin, follow-up with her primary doctor.   After the discussed management above, the patient was determined to be safe for discharge.  The patient was in agreement with this plan and all questions regarding their care were answered.  ED return precautions were discussed and the patient will return to the ED with any significant worsening of condition.   Final Clinical Impression(s) / ED Diagnoses Final diagnoses:  Acute nonintractable headache, unspecified headache type    Rx / DC Orders ED Discharge Orders         Ordered    promethazine (PHENERGAN) 25 MG tablet  Every 6 hours PRN        09/06/20 2319           Milagros Loll, MD 09/07/20 1448

## 2020-09-17 ENCOUNTER — Ambulatory Visit: Payer: 59 | Admitting: Nurse Practitioner

## 2020-10-07 ENCOUNTER — Other Ambulatory Visit: Payer: Self-pay

## 2020-10-08 ENCOUNTER — Ambulatory Visit (INDEPENDENT_AMBULATORY_CARE_PROVIDER_SITE_OTHER): Payer: 59 | Admitting: Nurse Practitioner

## 2020-10-08 ENCOUNTER — Encounter: Payer: Self-pay | Admitting: Nurse Practitioner

## 2020-10-08 VITALS — BP 100/70 | HR 76 | Temp 98.0°F | Wt 150.0 lb

## 2020-10-08 DIAGNOSIS — N6310 Unspecified lump in the right breast, unspecified quadrant: Secondary | ICD-10-CM | POA: Insufficient documentation

## 2020-10-08 DIAGNOSIS — L02221 Furuncle of abdominal wall: Secondary | ICD-10-CM

## 2020-10-08 DIAGNOSIS — F39 Unspecified mood [affective] disorder: Secondary | ICD-10-CM | POA: Diagnosis not present

## 2020-10-08 MED ORDER — MUPIROCIN 2 % EX OINT: 1.0000 "application " | TOPICAL_OINTMENT | Freq: Two times a day (BID) | CUTANEOUS | 0 refills | Status: DC

## 2020-10-08 MED ORDER — AMITRIPTYLINE HCL 25 MG PO TABS: 12.5000 mg | ORAL_TABLET | Freq: Every day | ORAL | 1 refills | Status: DC

## 2020-10-08 NOTE — Progress Notes (Signed)
Subjective:  Patient ID: Rebecca Rocha, female    DOB: 05-May-2001  Age: 20 y.o. MRN: [id number]  CC: Follow-up (Depression f/up and wound on abdomen)  HPI  Mood disorder (HCC) Improved mood with elavil but medication causes daytime somnolence. Takes medication at 7pm and wakes up at 6am each morning She also has therapy sessions every 2weeks which have been helpful.  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 Lesion shows 1 year stability. The recommendation is for one additional follow-up ultrasound of the RIGHT breast in 1 year. However, as the patient is considering surgery, ultrasound-guided core biopsy of RIGHT breast mass can be performed to confirm benignity.  Depression screen [facility] 2/9 10/08/2020 08/20/2020 04/07/2020  Decreased Interest 1 3 2   Down, Depressed, Hopeless 1 3 1   PHQ - 2 Score 2 6 3   Altered sleeping 3 3 3   Tired, decreased energy 1 3 2   Change in appetite 0 3 2  Feeling bad or failure about yourself  1 3 0  Trouble concentrating 0 3 1  Moving slowly or fidgety/restless 0 3 2  Suicidal thoughts 0 2 0  PHQ-9 Score 7 26 13   Difficult doing work/chores Somewhat difficult Extremely dIfficult Somewhat difficult   GAD 7 : Generalized Anxiety Score 10/08/2020 08/20/2020 04/07/2020 04/07/2020  Nervous, Anxious, on Edge 1 3 0 1  Control/stop worrying 0 3 0 2  Worry too much - different things 1 3 0 2  Trouble relaxing 1 3 0 2  Restless 0 3 0 2  Easily annoyed or irritable 1 3 0 2  Afraid - awful might happen 1 3 0 0  Total GAD 7 Score 5 21 0 11  Anxiety Difficulty Somewhat difficult Extremely difficult Not difficult at all Very difficult   Reviewed past Medical, Social and Family history today.  Outpatient Medications Prior to Visit  Medication Sig Dispense Refill  . acetaminophen (TYLENOL) 500 MG  tablet Take 1-2 tablets (500-1,000 mg total) by mouth every 6 (six) hours as needed for mild pain or moderate pain. 100 tablet 2  . albuterol (VENTOLIN HFA) 108 (90 Base) MCG/ACT inhaler 1-2 PUFFS EVERY 6 HOURS FOR WHEEZING OR SHORTNESS OF BREATH. NEED OFFICE VISIT FOR ADDITIONAL REFILL (Patient taking differently: Inhale 1-2 puffs into the lungs every 6 (six) hours as needed.) 6.7 each 1  . fluticasone (FLOVENT HFA) 110 MCG/ACT inhaler Inhale 1 puff into the lungs 2 (two) times daily. Rinse mouth after each use (Patient taking differently: Inhale 1 puff into the lungs as needed. Rinse mouth after each use) 1 each 2  . hyoscyamine (ANASPAZ) 0.125 MG TBDP disintergrating tablet Place 1 tablet (0.125 mg total) under the tongue every 6 (six) hours as needed for cramping. 90 tablet 2  . ibuprofen (ADVIL) 800 MG tablet Take 1 tablet (800 mg total) by mouth 3 (three) times daily. (Patient taking differently: Take 800 mg by mouth every 8 (eight) hours as needed.) 21 tablet 0  . ondansetron (ZOFRAN ODT) 4 MG disintegrating tablet Take 1 tablet (4 mg total) by mouth every 8 (eight) hours as needed for nausea or vomiting. 20 tablet 0  . promethazine (PHENERGAN) 25 MG tablet Take 1 tablet (25 mg total) by mouth every 6 (six) hours as needed for nausea or vomiting. 20 tablet 0  . amitriptyline (ELAVIL) 25 MG tablet Take 1 tablet (25  mg total) by mouth at bedtime. 30 tablet 5   No facility-administered medications prior to visit.    ROS See HPI  Objective:  BP 100/70 (BP Location: Left Arm, Patient Position: Sitting, Cuff Size: Normal)   Pulse 76   Temp 98 F (36.7 C) (Temporal)   Wt 150 lb (68 kg)   SpO2 100%   BMI 28.34 kg/m   Physical Exam Vitals reviewed.  Cardiovascular:     Rate and Rhythm: Normal rate.     Pulses: Normal pulses.  Abdominal:    Skin:    Findings: Erythema and lesion present.  Neurological:     Mental Status: She is alert and oriented to person, place, and time.   Psychiatric:        Mood and Affect: Mood normal.        Behavior: Behavior normal.        Thought Content: Thought content normal.     Assessment & Plan:  This visit occurred during the SARS-CoV-2 public health emergency.  Safety protocols were in place, including screening questions prior to the visit, additional usage of staff PPE, and extensive cleaning of exam room while observing appropriate contact time as indicated for disinfecting solutions.   Sharaya was seen today for follow-up.  Diagnoses and all orders for this visit:  Mood disorder (HCC) -     amitriptyline (ELAVIL) 25 MG tablet; Take 0.5 tablets (12.5 mg total) by mouth at bedtime.  Furuncle of abdominal wall -     mupirocin ointment (BACTROBAN) 2 %; Apply 1 application topically 2 (two) times daily.  Use warm compress 2-3x/day x each time.  Problem List Items Addressed This Visit      Other   Mood disorder (HCC) - Primary    Improved mood with elavil but medication causes daytime somnolence. Takes medication at 7pm and wakes up at 6am each morning She also has therapy sessions every 2weeks which have been helpful.  Advised to decrease elavil dose to 12.5mg  at hs and maintain therapy sessions F/up in 22month      Relevant Medications   amitriptyline (ELAVIL) 25 MG tablet    Other Visit Diagnoses    Furuncle of abdominal wall       Relevant Medications   mupirocin ointment (BACTROBAN) 2 %      Follow-up: Return in about 4 weeks (around 11/05/2020) for depression.  Alysia Penna, NP

## 2020-10-08 NOTE — Assessment & Plan Note (Signed)
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 Lesion shows 1 year stability. The recommendation is for one additional follow-up ultrasound of the RIGHT breast in 1 year. However, as the patient is considering surgery, ultrasound-guided core biopsy of RIGHT breast mass can be performed to confirm benignity.

## 2020-10-08 NOTE — Assessment & Plan Note (Signed)
Improved mood with elavil but medication causes daytime somnolence. Takes medication at 7pm and wakes up at 6am each morning She also has therapy sessions every 2weeks which have been helpful.  Advised to decrease elavil dose to 12.5mg  at hs and maintain therapy sessions F/up in 85month

## 2020-10-08 NOTE — Patient Instructions (Addendum)
Decrease elavil dose to half tab at bedtime. Continue therapy sessions.  Use bactroban ointment BID x 1week Use warm compress 2-3x/day x each time.

## 2020-10-12 ENCOUNTER — Encounter: Payer: No Typology Code available for payment source | Admitting: Obstetrics & Gynecology

## 2020-10-14 ENCOUNTER — Encounter: Payer: Self-pay | Admitting: Obstetrics & Gynecology

## 2020-10-14 ENCOUNTER — Ambulatory Visit (INDEPENDENT_AMBULATORY_CARE_PROVIDER_SITE_OTHER): Payer: 59 | Admitting: Obstetrics & Gynecology

## 2020-10-14 ENCOUNTER — Other Ambulatory Visit: Payer: Self-pay

## 2020-10-14 ENCOUNTER — Other Ambulatory Visit (HOSPITAL_COMMUNITY)
Admission: RE | Admit: 2020-10-14 | Discharge: 2020-10-14 | Disposition: A | Discharge status: Home / Self Care | Payer: 59 | Source: Ambulatory Visit | Attending: Obstetrics & Gynecology | Admitting: Obstetrics & Gynecology

## 2020-10-14 VITALS — BP 110/70 | Ht 60.25 in | Wt 158.0 lb

## 2020-10-14 DIAGNOSIS — Z3169 Encounter for other general counseling and advice on procreation: Secondary | ICD-10-CM | POA: Diagnosis not present

## 2020-10-14 DIAGNOSIS — Z01419 Encounter for gynecological examination (general) (routine) without abnormal findings: Secondary | ICD-10-CM

## 2020-10-14 NOTE — Progress Notes (Signed)
    Landa Tolentino-Gonzalez [birth year] [id number]   History:    20 y.o. 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   [id number] presenting for annual gyn exam   HPI: Nexplanon removed 05/2020.  Menses regular normal every month since then. No breakthrough bleeding. Planning to attempt conception in a couple months. No pelvic pain. No pain with intercourse. Breasts normal. Urine and bowel movements normal. Body mass index 30.6. Physically active.  Past medical history,surgical history, family history and social history were all reviewed and documented in the EPIC chart.  Gynecologic History Patient's last menstrual period was 10/06/2020.  Obstetric History OB History  Gravida Para Term Preterm AB Living  0 0 0 0 0 0  SAB IAB Ectopic Multiple Live Births  0 0 0 0 0     ROS: A ROS was performed and pertinent positives and negatives are included in the history.  GENERAL: No fevers or chills. HEENT: No change in vision, no earache, sore throat or sinus congestion. NECK: No pain or stiffness. CARDIOVASCULAR: No chest pain or pressure. No palpitations. PULMONARY: No shortness of breath, cough or wheeze. GASTROINTESTINAL: No abdominal pain, nausea, vomiting or diarrhea, melena or bright red blood per rectum. GENITOURINARY: No urinary frequency, urgency, hesitancy or dysuria. MUSCULOSKELETAL: No joint or muscle pain, no back pain, no recent trauma. DERMATOLOGIC: No rash, no itching, no lesions. ENDOCRINE: No polyuria, polydipsia, no heat or cold intolerance. No recent change in weight. HEMATOLOGICAL: No anemia or easy bruising or bleeding. NEUROLOGIC: No headache, seizures, numbness, tingling or weakness. PSYCHIATRIC: No depression, no loss of interest in normal activity or change in sleep pattern.     Exam:   BP 110/70   Ht 5' 0.25" (1.53 m)   Wt 158 lb (71.7 kg)   LMP 10/06/2020   BMI 30.60 kg/m   Body mass index is 30.6  kg/m.  General appearance : Well developed well nourished female. No acute distress HEENT: Eyes: no retinal hemorrhage or exudates,  Neck supple, trachea midline, no carotid bruits, no thyroidmegaly Lungs: Clear to auscultation, no rhonchi or wheezes, or rib retractions  Heart: Regular rate and rhythm, no murmurs or gallops Breast:Examined in sitting and supine position were symmetrical in appearance, no palpable masses or tenderness,  no skin retraction, no nipple inversion, no nipple discharge, no skin discoloration, no axillary or supraclavicular lymphadenopathy Abdomen: no palpable masses or tenderness, no rebound or guarding Extremities: no edema or skin discoloration or tenderness  Pelvic: Vulva: Normal             Vagina: No gross lesions or discharge  Cervix: No gross lesions or discharge.  Pap reflex done.  Uterus  AV, normal size, shape and consistency, non-tender and mobile  Adnexa  Without masses or tenderness  Anus: Normal   Assessment/Plan:  20 y.o. female for annual exam   1. Encounter for routine gynecological examination with Papanicolaou smear of cervix Normal gynecologic exam.  Pap reflex done.  Breast exam normal.  Body mass index 30.6.  Continue with fitness.  Recommend a mildly lower calorie/carb diet. - Cytology - PAP( [place])  2. Encounter for preconception consultation Planning to attempt conception in a couple of months.  Use condoms in the meantime.  Recommend starting on prenatal vitamins.  Healthy nutrition.  Continue with fitness.  Patient will call back to start on contraception if delays plans to conceive.  [name] MD, 8:20 AM 10/14/2020

## 2020-10-15 LAB — CYTOLOGY - PAP: Diagnosis: NEGATIVE

## 2020-11-02 ENCOUNTER — Other Ambulatory Visit: Payer: Self-pay

## 2020-11-02 ENCOUNTER — Encounter: Payer: Self-pay | Admitting: Student

## 2020-11-02 ENCOUNTER — Ambulatory Visit (INDEPENDENT_AMBULATORY_CARE_PROVIDER_SITE_OTHER): Payer: 59 | Admitting: Student

## 2020-11-02 VITALS — BP 98/68 | HR 81 | Ht 61.0 in | Wt 152.8 lb

## 2020-11-02 DIAGNOSIS — R55 Syncope and collapse: Secondary | ICD-10-CM | POA: Diagnosis not present

## 2020-11-02 DIAGNOSIS — G909 Disorder of the autonomic nervous system, unspecified: Secondary | ICD-10-CM

## 2020-11-02 NOTE — Progress Notes (Signed)
PCP:  Anne Ng, NP Primary Cardiologist: None Electrophysiologist: Lewayne Bunting, MD   Rebecca Rocha is a 20 y.o. female seen today for Lewayne Bunting, MD for routine electrophysiology followup.  Since last being seen in our clinic the patient reports doing well overall.   She had syncope about 2 weeks ago while her grandfather was in the ICU. He has recovered and is doing well.   Prior to that, she it had been at least 6 months since a syncope. She thinks it was perioperatively with gallbladder issues and cholecystectomy in 11/2019.  Initially was planning to attempt to get pregnant in the next few months, but wants to continue to hold off for now.    she denies chest pain, palpitations, dyspnea, PND, orthopnea, nausea, vomiting, edema, weight gain, or early satiety.  Past Medical History:  Diagnosis Date   Acute calculous cholecystitis 11/26/2019   Anxiety    Asthma    Depression    Menstrual migraine, intractable, without status migrainosus    Panic attack    Sinusitis    Sleep apnea    Syncope and collapse    Vertigo of central origin, unspecified ear    Past Surgical History:  Procedure Laterality Date   CHOLECYSTECTOMY N/A 11/26/2019   Procedure: LAPAROSCOPIC CHOLECYSTECTOMY;  Surgeon: Kinsinger, De Blanch, MD;  Location: [facility];  Service: General;  Laterality: N/A;    Current Outpatient Medications  Medication Sig Dispense Refill   albuterol (VENTOLIN HFA) 108 (90 Base) MCG/ACT inhaler 1-2 PUFFS EVERY 6 HOURS FOR WHEEZING OR SHORTNESS OF BREATH. NEED OFFICE VISIT FOR ADDITIONAL REFILL 6.7 each 1   amitriptyline (ELAVIL) 25 MG tablet Take 0.5 tablets (12.5 mg total) by mouth at bedtime. 45 tablet 1   hyoscyamine (ANASPAZ) 0.125 MG TBDP disintergrating tablet Place 1 tablet (0.125 mg total) under the tongue every 6 (six) hours as needed for cramping. 90 tablet 2   No current facility-administered medications for this visit.    No Known  Allergies  Social History   Socioeconomic History   Marital status: Single    Spouse name: Not on file   Number of children: 0   Years of education: Not on file   Highest education level: Not on file  Occupational History   Not on file  Tobacco Use   Smoking status: Never   Smokeless tobacco: Never  Vaping Use   Vaping Use: Never used  Substance and Sexual Activity   Alcohol use: Yes    Comment: RARE   Drug use: No   Sexual activity: Yes    Partners: Male    Birth control/protection: Condom    Comment: 1st intercourse- 15, partners- 5, current partnER- 4 YRS  Other Topics Concern   Not on file  Social History Scientist, research (medical) at [employer] Strain: Not on file  Food Insecurity: Not on file  Transportation Needs: Not on file  Physical Activity: Not on file  Stress: Not on file  Social Connections: Not on file  Intimate Partner Violence: Not on file     Review of Systems: General: No chills, fever, night sweats or weight changes  Cardiovascular:  No chest pain, dyspnea on exertion, edema, orthopnea, palpitations, paroxysmal nocturnal dyspnea Dermatological: No rash, lesions or masses Respiratory: No cough, dyspnea Urologic: No hematuria, dysuria Abdominal: No nausea, vomiting, diarrhea, bright red blood per rectum, melena, or hematemesis Neurologic: No visual changes, weakness, changes in mental status  All other systems reviewed and are otherwise negative except as noted above.  Physical Exam: Vitals:   11/02/20 0927  BP: 98/68  Pulse: 81  SpO2: 97%  Weight: 152 lb 12.8 oz (69.3 kg)  Height: 5\' 1"  (1.549 m)    GEN- The patient is well appearing, alert and oriented x 3 today.   HEENT: normocephalic, atraumatic; sclera clear, conjunctiva pink; hearing intact; oropharynx clear; neck supple, no JVP Lymph- no cervical lymphadenopathy Lungs- Clear to ausculation bilaterally, normal work of breathing.  No  wheezes, rales, rhonchi Heart- Regular rate and rhythm, no murmurs, rubs or gallops, PMI not laterally displaced GI- soft, non-tender, non-distended, bowel sounds present, no hepatosplenomegaly Extremities- no clubbing, cyanosis, or edema; DP/PT/radial pulses 2+ bilaterally MS- no significant deformity or atrophy Skin- warm and dry, no rash or lesion Psych- euthymic mood, full affect Neuro- strength and sensation are intact  EKG is not ordered. Personal review of EKG from  02/2020  shows NSR at 74 bpm with QTc 383 ms  Additional studies reviewed include: Previous EP office notes. OB notes.  Assessment and Plan:  1. Syncope / Autonomic dysfunction Infrequent recurrence, usually in the setting of clear stressors Discusses importance of trigger avoidance Reviewed importance of liberalization of salt and fluid in diet, limiting ETOH and caffeine, eating a well balanced diet without skipping meals, and going supine when symptoms start. No driving restrictions at this point.  Symptoms may be affected by pregnancy. We reviewed that 1st trimester may have hyperemesis, and 2nd trimester may actually have symptom improvement with increased volume overall. 3 trimester, especially the second half may be associated with worsening autonomic symptoms in the setting of uterine enlargement and IVC compression.   No clear contraindication for attempts to get pregnant.   I scheduled her for yearly follow up. If she decides to proceed with pregnancy, recommended a visit sometime between the 2nd and 3rd trimester for general follow up.   03/2020, PA-C  11/02/20 10:30 AM

## 2020-11-02 NOTE — Patient Instructions (Signed)
Medication Instructions:  Your physician recommends that you continue on your current medications as directed. Please refer to the Current Medication list given to you today.  *If you need a refill on your cardiac medications before your next appointment, please call your pharmacy*   Lab Work: None If you have labs (blood work) drawn today and your tests are completely normal, you will receive your results only by: MyChart Message (if you have MyChart) OR A paper copy in the mail If you have any lab test that is abnormal or we need to change your treatment, we will call you to review the results.   Follow-Up: At [facility], you and your health needs are our priority.  As part of our continuing mission to provide you with exceptional heart care, we have created designated Provider Care Teams.  These Care Teams include your primary Cardiologist (physician) and Advanced Practice Providers (APPs -  Physician Assistants and Nurse Practitioners) who all work together to provide you with the care you need, when you need it.  Your next appointment:   1 year(s)  The format for your next appointment:   In Person  Provider:   You may see Gregg Taylor, MD or one of the following Advanced Practice Providers on your designated Care Team:   Renee Ursuy, PA-C Michael "Andy" Tillery, PA-C   

## 2020-11-04 ENCOUNTER — Telehealth: Payer: Self-pay

## 2020-11-04 NOTE — Telephone Encounter (Signed)
Rebecca Rocha aware of the need for an appointment. The front desk is calling her now.

## 2020-11-04 NOTE — Telephone Encounter (Signed)
Please advise or prescribe.   Rebecca Rocha stated she is using her inhaler more often. She's used it 20 times in the past 3 days because of the heat & shortness of breath.  Patient would like a nebulizing solution called to the pharmacy.   Thank you

## 2020-11-04 NOTE — Telephone Encounter (Signed)
Pt calling to request a prescription for a nebulizer with the albuterol solution.  Pt said that she has been SOB and she would benefit from a breathing treatment verses using her inhaler.  Pt explains that her inhaler isn't working.  Pt would like a call back to let her know if [facility] would send this in for her.  Please advise. CB#[phone]   Last OV 10/08/20  Please change pharmacy to:  [facility] on [address]

## 2020-11-05 ENCOUNTER — Encounter: Payer: Self-pay | Admitting: Nurse Practitioner

## 2020-11-05 ENCOUNTER — Telehealth (INDEPENDENT_AMBULATORY_CARE_PROVIDER_SITE_OTHER): Payer: 59 | Admitting: Nurse Practitioner

## 2020-11-05 VITALS — Ht 61.0 in | Wt 152.0 lb

## 2020-11-05 DIAGNOSIS — J4541 Moderate persistent asthma with (acute) exacerbation: Secondary | ICD-10-CM

## 2020-11-05 MED ORDER — ALBUTEROL SULFATE (2.5 MG/3ML) 0.083% IN NEBU: 2.5000 mg | INHALATION_SOLUTION | Freq: Four times a day (QID) | RESPIRATORY_TRACT | 0 refills | Status: DC | PRN

## 2020-11-05 MED ORDER — PREDNISONE 10 MG (21) PO TBPK: ORAL_TABLET | ORAL | 0 refills | Status: DC

## 2020-11-05 NOTE — Patient Instructions (Signed)
Complete home COVID test If no improvement in 3days, call office Go to urgent care clinic if symptoms worsens over the weekend. Use nebulizer every 6hrs as needed. Do not use at same time as hand held inhaler.

## 2020-11-05 NOTE — Progress Notes (Signed)
Virtual Visit via Video Note  I connected withNAME@ on 11/05/20 at  8:15 AM EDT by a video enabled telemedicine application and verified that I am speaking with the correct person using two identifiers.  Location: Patient:Home Provider: Office Participants: patient and provider  I discussed the limitations of evaluation and management by telemedicine and the availability of in person appointments. I also discussed with the patient that there may be a patient responsible charge related to this service. The patient expressed understanding and agreed to proceed.  WU:[id number] exacerbation  History of Present Illness: Asthma She complains of chest tightness, cough, difficulty breathing, shortness of breath and wheezing. There is no frequent throat clearing, hemoptysis, hoarse voice or sputum production. This is a new problem. The current episode started in the past 7 days. The problem occurs daily. The problem has been unchanged. The cough is non-productive and dry. Pertinent negatives include no chest pain, dyspnea on exertion, malaise/fatigue, nasal congestion, postnasal drip, rhinorrhea, sneezing or sore throat. Her symptoms are aggravated by emotional stress. Her symptoms are alleviated by beta-agonist. She reports minimal improvement on treatment. Her symptoms are not alleviated by beta-agonist. Her past medical history is significant for asthma. There is no history of bronchiectasis, bronchitis, COPD, emphysema or pneumonia.   Denies any increase anxiety  Unable to provide any vital signs.  Observations/Objective: Physical Exam Constitutional:      General: She is not in acute distress. Pulmonary:     Effort: Pulmonary effort is normal.  Neurological:     Mental Status: She is alert and oriented to person, place, and time.    Assessment and Plan: Mayre was seen today for asthma.  Diagnoses and all orders for this visit:  Moderate persistent asthma with acute exacerbation -      predniSONE (STERAPRED UNI-PAK 21 TAB) 10 MG (21) TBPK tablet; As directed on package -     For home use only DME Nebulizer machine -     albuterol (PROVENTIL) (2.5 MG/3ML) 0.083% nebulizer solution; Take 3 mLs (2.5 mg total) by nebulization every 6 (six) hours as needed for wheezing or shortness of breath.  Follow Up Instructions: Complete home COVID test If no improvement in 3days, call office Go to urgent care clinic if symptoms worsens over the weekend. Use nebulizer every 6hrs as needed. Do not use at same time as hand held inhaler   I discussed the assessment and treatment plan with the patient. The patient was provided an opportunity to ask questions and all were answered. The patient agreed with the plan and demonstrated an understanding of the instructions.   The patient was advised to call back or seek an in-person evaluation if the symptoms worsen or if the condition fails to improve as anticipated.  Alysia Penna, NP

## 2020-11-17 ENCOUNTER — Telehealth: Payer: Self-pay | Admitting: *Deleted

## 2020-11-17 NOTE — Telephone Encounter (Signed)
Patient called reports she does not want to proceed with Nexplanon, she would prefer to have Depo provera. Okay to send in Rx for depo provera. Patient is sexually active and would like to start ASAP. Please advise

## 2020-11-19 MED ORDER — MEDROXYPROGESTERONE ACETATE 150 MG/ML IM SUSP: 150.0000 mg | INTRAMUSCULAR | 0 refills | Status: DC

## 2020-11-19 NOTE — Telephone Encounter (Signed)
Dr.Lavoie replied "Agree with DepoProvera. " Patient  aware, Rx sent.

## 2020-11-26 ENCOUNTER — Other Ambulatory Visit: Payer: Self-pay

## 2020-11-26 ENCOUNTER — Other Ambulatory Visit: Payer: 59

## 2020-11-26 ENCOUNTER — Other Ambulatory Visit: Payer: Self-pay | Admitting: Obstetrics & Gynecology

## 2020-11-26 ENCOUNTER — Ambulatory Visit: Payer: 59

## 2020-11-26 DIAGNOSIS — N912 Amenorrhea, unspecified: Secondary | ICD-10-CM

## 2020-11-26 LAB — PREGNANCY, URINE: Preg Test, Ur: NEGATIVE

## 2020-12-10 ENCOUNTER — Other Ambulatory Visit: Payer: Self-pay

## 2020-12-10 ENCOUNTER — Ambulatory Visit (INDEPENDENT_AMBULATORY_CARE_PROVIDER_SITE_OTHER): Payer: 59 | Admitting: Gynecology

## 2020-12-10 DIAGNOSIS — Z3042 Encounter for surveillance of injectable contraceptive: Secondary | ICD-10-CM

## 2020-12-10 DIAGNOSIS — Z01812 Encounter for preprocedural laboratory examination: Secondary | ICD-10-CM

## 2020-12-10 LAB — PREGNANCY, URINE: Preg Test, Ur: NEGATIVE

## 2020-12-10 MED ORDER — MEDROXYPROGESTERONE ACETATE 150 MG/ML IM SUSP
150.0000 mg | Freq: Once | INTRAMUSCULAR | Status: AC
  Administered 2020-12-10: 150 mg via INTRAMUSCULAR

## 2020-12-15 ENCOUNTER — Encounter (INDEPENDENT_AMBULATORY_CARE_PROVIDER_SITE_OTHER): Payer: Self-pay

## 2020-12-17 ENCOUNTER — Encounter: Payer: Self-pay | Admitting: Nurse Practitioner

## 2020-12-17 ENCOUNTER — Other Ambulatory Visit: Payer: Self-pay

## 2020-12-17 ENCOUNTER — Ambulatory Visit (INDEPENDENT_AMBULATORY_CARE_PROVIDER_SITE_OTHER): Payer: 59 | Admitting: Nurse Practitioner

## 2020-12-17 VITALS — BP 116/60 | HR 97 | Temp 98.7°F | Ht 64.0 in | Wt 151.0 lb

## 2020-12-17 DIAGNOSIS — J4541 Moderate persistent asthma with (acute) exacerbation: Secondary | ICD-10-CM

## 2020-12-17 DIAGNOSIS — F332 Major depressive disorder, recurrent severe without psychotic features: Secondary | ICD-10-CM

## 2020-12-17 DIAGNOSIS — F39 Unspecified mood [affective] disorder: Secondary | ICD-10-CM

## 2020-12-17 MED ORDER — FLUTICASONE PROPIONATE HFA 110 MCG/ACT IN AERO: 1.0000 | INHALATION_SPRAY | Freq: Two times a day (BID) | RESPIRATORY_TRACT | 3 refills | Status: DC

## 2020-12-17 MED ORDER — BUPROPION HCL 100 MG PO TABS: 100.0000 mg | ORAL_TABLET | Freq: Two times a day (BID) | ORAL | 5 refills | Status: DC

## 2020-12-17 NOTE — Patient Instructions (Signed)
Continue elavil Start wellbutrin 100mg  BID You will be contacted to schedule appt with psychaitry and for spirometry.  Use peak flow meter every morning and record. Start flovent as prescribed F/up in 2weeks

## 2020-12-17 NOTE — Progress Notes (Signed)
Subjective:  Patient ID: Rebecca Rocha, female    DOB: Oct 25, 2000  Age: 20 y.o. MRN: [id number]  CC: Follow-up (4 week f/u on depression. Patient would like a referral for psychology. )   HPI  Asthma Uncontrolled, daily chest tightness and SOB. Worse in AM Some improvement with oral prednisone 62month ago. No GERD, no nasal congestion, no fever, no cough, no night sweats, no nicotine use Use of albuterol inhaler or nebulizer 2-3x/day with relief. No use of ICS at this time.  Provided peak flow meter today Use peak flow meter every morning and record. Start flovent as prescribed F/up in 2weeks Ordered PFT  Depression, major, single episode, moderate (HCC) No improvement in mood with elavil, unable to tolerate higher dose. Denies any SI/HI or hallucinations. Has maintained appts with psychologists. She will also like to establish with a psychiatrist.  She is able to provide verbal safety contract. Also provided ED precautions Continue elavil Start wellbutrin 100mg  BID You will be contacted to schedule appt with psychiatry. F/up in 2weeks Depression screen [facility] 2/9 12/17/2020 10/08/2020 08/20/2020  Decreased Interest 3 1 3   Down, Depressed, Hopeless 3 1 3   PHQ - 2 Score 6 2 6   Altered sleeping 3 3 3   Tired, decreased energy 3 1 3   Change in appetite 3 0 3  Feeling bad or failure about yourself  3 1 3   Trouble concentrating 3 0 3  Moving slowly or fidgety/restless 3 0 3  Suicidal thoughts 1 0 2  PHQ-9 Score 25 7 26   Difficult doing work/chores Extremely dIfficult Somewhat difficult Extremely dIfficult    GAD 7 : Generalized Anxiety Score 12/17/2020 10/08/2020 08/20/2020 04/07/2020  Nervous, Anxious, on Edge 3 1 3  0  Control/stop worrying 3 0 3 0  Worry too much - different things 3 1 3  0  Trouble relaxing 3 1 3  0  Restless 3 0 3 0  Easily annoyed or irritable 3 1 3  0  Afraid - awful might happen 3 1 3  0  Total GAD 7 Score 21 5 21  0  Anxiety Difficulty Extremely  difficult Somewhat difficult Extremely difficult Not difficult at all      Reviewed past Medical, Social and Family history today.  Outpatient Medications Prior to Visit  Medication Sig Dispense Refill   albuterol (PROVENTIL) (2.5 MG/3ML) 0.083% nebulizer solution Take 3 mLs (2.5 mg total) by nebulization every 6 (six) hours as needed for wheezing or shortness of breath. 100 mL 0   albuterol (VENTOLIN HFA) 108 (90 Base) MCG/ACT inhaler 1-2 PUFFS EVERY 6 HOURS FOR WHEEZING OR SHORTNESS OF BREATH. NEED OFFICE VISIT FOR ADDITIONAL REFILL 6.7 each 1   amitriptyline (ELAVIL) 25 MG tablet Take 0.5 tablets (12.5 mg total) by mouth at bedtime. 45 tablet 1   hyoscyamine (ANASPAZ) 0.125 MG TBDP disintergrating tablet Place 1 tablet (0.125 mg total) under the tongue every 6 (six) hours as needed for cramping. 90 tablet 2   medroxyPROGESTERone (DEPO-PROVERA) 150 MG/ML injection Inject 1 mL (150 mg total) into the muscle every 3 (three) months. 1 mL 0   predniSONE (STERAPRED UNI-PAK 21 TAB) 10 MG (21) TBPK tablet As directed on package (Patient not taking: Reported on 12/17/2020) 21 tablet 0   No facility-administered medications prior to visit.    ROS See HPI  Objective:  BP 116/60 (BP Location: Left Arm, Patient Position: Sitting, Cuff Size: Normal)   Pulse 97   Temp 98.7 F (37.1 C) (Temporal)   Ht 5\' 4"  (1.626 m)  Wt 151 lb (68.5 kg)   LMP 12/08/2020   SpO2 99%   BMI 25.92 kg/m   Physical Exam Cardiovascular:     Rate and Rhythm: Normal rate and regular rhythm.     Pulses: Normal pulses.     Heart sounds: Normal heart sounds.  Pulmonary:     Effort: Pulmonary effort is normal.     Breath sounds: Normal breath sounds. No wheezing.  Neurological:     Mental Status: She is alert and oriented to person, place, and time.  Psychiatric:        Attention and Perception: Attention normal.        Mood and Affect: Affect is flat.        Speech: Speech normal.        Behavior: Behavior  normal. Behavior is cooperative.        Thought Content: Thought content normal.        Cognition and Memory: Cognition normal.        Judgment: Judgment normal.    Assessment & Plan:  This visit occurred during the SARS-CoV-2 public health emergency.  Safety protocols were in place, including screening questions prior to the visit, additional usage of staff PPE, and extensive cleaning of exam room while observing appropriate contact time as indicated for disinfecting solutions.   Rebecca Rocha was seen today for follow-up.  Diagnoses and all orders for this visit:  Severe episode of recurrent major depressive disorder, without psychotic features (HCC) -     Ambulatory referral to Psychiatry -     buPROPion (WELLBUTRIN) 100 MG tablet; Take 1 tablet (100 mg total) by mouth 2 (two) times daily.  Moderate persistent asthma with acute exacerbation -     Spirometry with graph; Future -     fluticasone (FLOVENT HFA) 110 MCG/ACT inhaler; Inhale 1 puff into the lungs 2 (two) times daily. Rinse mouth after each use   Problem List Items Addressed This Visit       Respiratory   Asthma    Uncontrolled, daily chest tightness and SOB. Worse in AM Some improvement with oral prednisone 71month ago. No GERD, no nasal congestion, no fever, no cough, no night sweats, no nicotine use Use of albuterol inhaler or nebulizer 2-3x/day with relief. No use of ICS at this time.  Provided peak flow meter today Use peak flow meter every morning and record. Start flovent as prescribed F/up in 2weeks Ordered PFT       Relevant Medications   fluticasone (FLOVENT HFA) 110 MCG/ACT inhaler   Other Relevant Orders   Spirometry with graph     Other   Depression, major, single episode, moderate (HCC) - Primary    No improvement in mood with elavil, unable to tolerate higher dose. Denies any SI/HI or hallucinations. Has maintained appts with psychologists. She will also like to establish with a  psychiatrist.  She is able to provide verbal safety contract. Also provided ED precautions Continue elavil Start wellbutrin 100mg  BID You will be contacted to schedule appt with psychiatry. F/up in 2weeks       Relevant Medications   buPROPion (WELLBUTRIN) 100 MG tablet    Follow-up: Return in about 2 weeks (around 12/31/2020) for depression (01/02/2021).  , NP

## 2020-12-18 ENCOUNTER — Encounter: Payer: Self-pay | Admitting: Nurse Practitioner

## 2020-12-18 NOTE — Assessment & Plan Note (Addendum)
Uncontrolled, daily chest tightness and SOB. Worse in AM Some improvement with oral prednisone 10month ago. No GERD, no nasal congestion, no fever, no cough, no night sweats, no nicotine use Use of albuterol inhaler or nebulizer 2-3x/day with relief. No use of ICS at this time.  Provided peak flow meter today Use peak flow meter every morning and record. Start flovent as prescribed F/up in 2weeks Ordered PFT

## 2020-12-18 NOTE — Assessment & Plan Note (Signed)
No improvement in mood with elavil, unable to tolerate higher dose. Denies any SI/HI or hallucinations. Has maintained appts with psychologists. She will also like to establish with a psychiatrist.  She is able to provide verbal safety contract. Also provided ED precautions Continue elavil Start wellbutrin 100mg  BID You will be contacted to schedule appt with psychiatry. F/up in 2weeks

## 2020-12-24 ENCOUNTER — Ambulatory Visit: Payer: 59 | Admitting: Obstetrics & Gynecology

## 2020-12-31 ENCOUNTER — Encounter: Payer: Self-pay | Admitting: Nurse Practitioner

## 2020-12-31 ENCOUNTER — Ambulatory Visit (INDEPENDENT_AMBULATORY_CARE_PROVIDER_SITE_OTHER): Payer: 59 | Admitting: Nurse Practitioner

## 2020-12-31 ENCOUNTER — Other Ambulatory Visit: Payer: Self-pay

## 2020-12-31 VITALS — BP 110/80 | HR 90 | Temp 98.4°F | Ht 61.0 in | Wt 150.0 lb

## 2020-12-31 DIAGNOSIS — J4530 Mild persistent asthma, uncomplicated: Secondary | ICD-10-CM

## 2020-12-31 DIAGNOSIS — F321 Major depressive disorder, single episode, moderate: Secondary | ICD-10-CM

## 2020-12-31 NOTE — Assessment & Plan Note (Signed)
Peak flow reading for last 2weeks: 250-367ml/min Current use of flovent BID She has not needed SABA in last 2weeks Still waiting for PFT to be scheduled.  continue flovent daily and albuterol prn F/up in 71month

## 2020-12-31 NOTE — Progress Notes (Signed)
Subjective:  Patient ID: Rebecca Rocha, female    DOB: [birth year]  Age: 20 y.o. MRN: [id number]  CC: Follow-up (2 week follow up on depression, pt states she thinks new medication is working. /Pt would like to discuss lack of appetite, pt states she eats very little and some times not at all. )  HPI  Asthma Peak flow reading for last 2weeks: 250-348ml/min Current use of flovent BID She has not needed SABA in last 2weeks Still waiting for PFT to be scheduled.  continue flovent daily and albuterol prn F/up in 18month    Depression, major, single episode, moderate (HCC) Improving with wellbutrin and elavil Denies any SI/HI Waiting for appt with psychiatrist.  Continue current medications F/up in 54month Wt Readings from Last 3 Encounters:  12/31/20 150 lb (68 kg)  12/17/20 151 lb (68.5 kg)  11/05/20 152 lb (68.9 kg)    Depression screen [facility] 2/9 12/17/2020 10/08/2020 08/20/2020  Decreased Interest 3 1 3   Down, Depressed, Hopeless 3 1 3   PHQ - 2 Score 6 2 6   Altered sleeping 3 3 3   Tired, decreased energy 3 1 3   Change in appetite 3 0 3  Feeling bad or failure about yourself  3 1 3   Trouble concentrating 3 0 3  Moving slowly or fidgety/restless 3 0 3  Suicidal thoughts 1 0 2  PHQ-9 Score 25 7 26   Difficult doing work/chores Extremely dIfficult Somewhat difficult Extremely dIfficult    Reviewed past Medical, Social and Family history today.  Outpatient Medications Prior to Visit  Medication Sig Dispense Refill   albuterol (PROVENTIL) (2.5 MG/3ML) 0.083% nebulizer solution Take 3 mLs (2.5 mg total) by nebulization every 6 (six) hours as needed for wheezing or shortness of breath. 100 mL 0   albuterol (VENTOLIN HFA) 108 (90 Base) MCG/ACT inhaler 1-2 PUFFS EVERY 6 HOURS FOR WHEEZING OR SHORTNESS OF BREATH. NEED OFFICE VISIT FOR ADDITIONAL REFILL 6.7 each 1   amitriptyline (ELAVIL) 25 MG tablet Take 0.5 tablets (12.5 mg total) by mouth at bedtime. 45 tablet 1    buPROPion (WELLBUTRIN) 100 MG tablet Take 1 tablet (100 mg total) by mouth 2 (two) times daily. 60 tablet 5   fluticasone (FLOVENT HFA) 110 MCG/ACT inhaler Inhale 1 puff into the lungs 2 (two) times daily. Rinse mouth after each use 1 each 3   hyoscyamine (ANASPAZ) 0.125 MG TBDP disintergrating tablet Place 1 tablet (0.125 mg total) under the tongue every 6 (six) hours as needed for cramping. 90 tablet 2   medroxyPROGESTERone (DEPO-PROVERA) 150 MG/ML injection Inject 1 mL (150 mg total) into the muscle every 3 (three) months. 1 mL 0   No facility-administered medications prior to visit.    ROS See HPI  Objective:  BP 110/80 (BP Location: Left Arm, Patient Position: Sitting, Cuff Size: Normal)   Pulse 90   Temp 98.4 F (36.9 C) (Temporal)   Ht 5\' 1"  (1.549 m)   Wt 150 lb (68 kg)   LMP 12/08/2020   SpO2 98%   BMI 28.34 kg/m   Physical Exam Cardiovascular:     Rate and Rhythm: Normal rate and regular rhythm.     Pulses: Normal pulses.     Heart sounds: Normal heart sounds.  Pulmonary:     Effort: Pulmonary effort is normal.     Breath sounds: Normal breath sounds.  Neurological:     Mental Status: She is alert and oriented to person, place, and time.  Psychiatric:  Mood and Affect: Mood normal.        Behavior: Behavior normal.        Thought Content: Thought content normal.   Assessment & Plan:  This visit occurred during the SARS-CoV-2 public health emergency.  Safety protocols were in place, including screening questions prior to the visit, additional usage of staff PPE, and extensive cleaning of exam room while observing appropriate contact time as indicated for disinfecting solutions.   Rebecca Rocha was seen today for follow-up.  Diagnoses and all orders for this visit:  Depression, major, single episode, moderate (HCC)  Mild persistent asthma without complication  Problem List Items Addressed This Visit       Respiratory   Asthma    Peak flow reading for last  2weeks: 250-346ml/min Current use of flovent BID She has not needed SABA in last 2weeks Still waiting for PFT to be scheduled.  continue flovent daily and albuterol prn F/up in 64month          Other   Depression, major, single episode, moderate (HCC) - Primary    Improving with wellbutrin and elavil Denies any SI/HI Waiting for appt with psychiatrist.  Continue current medications F/up in 28month       Follow-up: Return in about 4 weeks (around 01/28/2021) for depression, appetite, and asthma ( ).  Alysia Penna, NP

## 2020-12-31 NOTE — Assessment & Plan Note (Signed)
Improving with wellbutrin and elavil Denies any SI/HI Waiting for appt with psychiatrist.  Continue current medications F/up in 27month

## 2020-12-31 NOTE — Patient Instructions (Signed)
Continue current medications Schedule appt with psychiatry and for spirometry when contact. Maintain small frequent meals

## 2021-01-12 ENCOUNTER — Ambulatory Visit (INDEPENDENT_AMBULATORY_CARE_PROVIDER_SITE_OTHER): Payer: 59 | Admitting: Family Medicine

## 2021-01-12 ENCOUNTER — Other Ambulatory Visit: Payer: Self-pay

## 2021-01-12 VITALS — HR 79 | Temp 98.0°F | Ht 61.0 in | Wt 151.2 lb

## 2021-01-12 DIAGNOSIS — R519 Headache, unspecified: Secondary | ICD-10-CM | POA: Diagnosis not present

## 2021-01-12 DIAGNOSIS — M791 Myalgia, unspecified site: Secondary | ICD-10-CM | POA: Diagnosis not present

## 2021-01-12 LAB — COMPREHENSIVE METABOLIC PANEL
ALT: 14 U/L (ref 0–35)
AST: 14 U/L (ref 0–37)
Albumin: 4.2 g/dL (ref 3.5–5.2)
Alkaline Phosphatase: 68 U/L (ref 39–117)
BUN: 14 mg/dL (ref 6–23)
CO2: 23 mEq/L (ref 19–32)
Calcium: 9.5 mg/dL (ref 8.4–10.5)
Chloride: 105 mEq/L (ref 96–112)
Creatinine, Ser: 0.79 mg/dL (ref 0.40–1.20)
GFR: 106.26 mL/min (ref >60.00)
Glucose, Bld: 88 mg/dL (ref 70–99)
Potassium: 4.1 mEq/L (ref 3.5–5.1)
Sodium: 136 mEq/L (ref 135–145)
Total Bilirubin: 0.4 mg/dL (ref 0.2–1.2)
Total Protein: 7.6 g/dL (ref 6.0–8.3)

## 2021-01-12 LAB — URINALYSIS, ROUTINE W REFLEX MICROSCOPIC
Bilirubin Urine: NEGATIVE
Ketones, ur: NEGATIVE
Leukocytes,Ua: NEGATIVE
Nitrite: NEGATIVE
Specific Gravity, Urine: 1.025 (ref 1.000–1.030)
Total Protein, Urine: NEGATIVE
Urine Glucose: NEGATIVE
Urobilinogen, UA: 0.2 (ref 0.0–1.0)
pH: 6 (ref 5.0–8.0)

## 2021-01-12 LAB — SEDIMENTATION RATE: Sed Rate: 24 mm/hr — ABNORMAL HIGH (ref 0–20)

## 2021-01-12 LAB — C-REACTIVE PROTEIN: CRP: <1 mg/dL (ref 0.5–20.0)

## 2021-01-12 NOTE — Progress Notes (Signed)
[facility] [address] [place] [address] Dept: [phone] Dept Fax: [phone]  Office Visit  Subjective:    Patient ID: Rebecca Rocha, female    DOB: 06-19-2000, 20 y.o..   MRN: [id number]  Chief Complaint  Patient presents with   Acute Visit    C/o having joint/muscle pain x 2 days. She has taken Ibuprofen and prednisone (moms RX).      History of Present Illness:  Patient is in today for assessment of diffuse muscle and joint pain. Rebecca Rocha notes acute onset of muscle pains starting 2 days ago. This began in the thighs, then progressed to the ankle and feet. Since then, she is also having forearm, hand and neck pain. This also has triggered a "migraine" headache for her. She denies fever, weight loss, change in appetite, rashes, or GI symptoms. She has noted urinary frequency. Her mother has a history of lupus. Out of concern for this possibly being her issue, Rebecca Rocha took three prednisone tablets 2 days ago (she is not sure of the dose). She has also taken a single 800 mg ibuprofen.  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   Hypertension Father    Diabetes Father    Cancer Paternal Grandmother    Diabetes Paternal Grandmother    Hypertension Paternal Grandmother    High Cholesterol Paternal Grandmother    Cancer Paternal Grandfather    Diabetes Paternal Grandfather    Hypertension Paternal Grandfather    High Cholesterol Paternal Grandfather    Anxiety disorder Sister    Cancer Maternal Aunt    Colon cancer Maternal Uncle    Prostate cancer Paternal Uncle    Esophageal cancer Neg Hx    Rectal cancer Neg Hx    Stomach cancer Neg Hx    Outpatient Medications Prior to Visit  Medication Sig Dispense Refill   albuterol (PROVENTIL) (2.5 MG/3ML) 0.083% nebulizer solution Take 3 mLs (2.5 mg total) by nebulization every 6 (six) hours as needed for wheezing or shortness of breath. 100 mL 0   albuterol (VENTOLIN HFA) 108 (90 Base) MCG/ACT inhaler 1-2 PUFFS EVERY 6 HOURS FOR WHEEZING OR SHORTNESS OF BREATH. NEED OFFICE VISIT FOR ADDITIONAL REFILL 6.7 each 1   amitriptyline (ELAVIL) 25 MG tablet Take 0.5 tablets (12.5 mg total) by mouth at bedtime. 45 tablet 1   buPROPion (WELLBUTRIN) 100 MG tablet Take 1 tablet (100 mg total) by mouth 2 (two) times daily. 60 tablet 5   fluticasone (FLOVENT HFA) 110 MCG/ACT inhaler Inhale 1 puff into the lungs 2 (  two) times daily. Rinse mouth after each use 1 each 3   hyoscyamine (ANASPAZ) 0.125 MG TBDP disintergrating tablet Place 1 tablet (0.125 mg total) under the tongue every 6 (six) hours as needed for cramping. 90 tablet 2   medroxyPROGESTERone (DEPO-PROVERA) 150 MG/ML injection Inject 1 mL (150 mg total) into the muscle every 3 (three) months. 1 mL 0   No facility-administered medications prior to visit.   No Known Allergies    Objective:   Today's Vitals   01/12/21 0802  Pulse: 79  Temp: 98 F (36.7 C)  TempSrc: Temporal  SpO2: 98%   Weight: 151 lb 3.2 oz (68.6 kg)  Height: 5\' 1"  (1.549 m)   Body mass index is 28.57 kg/m.   General: Well developed, well nourished. No acute distress. HEENT: Normocephalic, non-traumatic. Eyes are clear. Mucous membranes moist. Oropharynx clear.   Good dentition. Neck: Supple. No lymphadenopathy. No thyromegaly. Lungs: Clear to auscultation bilaterally. No wheezing, rales or rhonchi. CV: RRR without murmurs or rubs. Pulses 2+ bilaterally. Extremities: Full ROM. No joint swelling, or redness. Patient notes tenderness over the muscles of the   thighs and forearms, but there is no wincing with palpation. No edema noted. Skin: Warm and dry. No rashes. Specifically no facial rash. Psych: Alert and oriented. Normal mood and affect.  Health Maintenance Due  Topic Date Due   Pneumococcal Vaccine 72-31 Years old (1 - PCV) Never done   HPV VACCINES (1 - 2-dose series) Never done   CHLAMYDIA SCREENING  08/15/2019   COVID-19 Vaccine (3 - Pfizer risk series) 01/30/2020   INFLUENZA VACCINE  12/13/2020     Assessment & Plan:   1. Myalgia I suspect this may be a transient viral myalgia. Rebecca Rocha is not on any medication that I would suspect of causing this. I see no objective symptoms of lupus. I will do some labs to assess for lupus, inflammatory arthritis or a myositis. In the meantime, I recommend she take ibuprofen 800 mg tid for 2 days. I will have her follow-up with Ms. Nche next week.  - Comprehensive metabolic panel - Sedimentation rate - C-reactive protein - ANA - Rheumatoid factor - Urinalysis, Routine w reflex microscopic - CK; Future  2. Nonintractable headache, unspecified chronicity pattern, unspecified headache type I am not sure that this is a migraine. The patient has a history of tension headaches per her chart. The ibuprofen should help to resolve this.  Jolayne Panther, MD

## 2021-01-14 LAB — RHEUMATOID FACTOR: Rheumatoid fact SerPl-aCnc: <14 IU/mL (ref <14)

## 2021-01-14 LAB — ANA: Anti Nuclear Antibody (ANA): NEGATIVE

## 2021-01-20 ENCOUNTER — Telehealth: Payer: Self-pay | Admitting: Nurse Practitioner

## 2021-01-20 NOTE — Telephone Encounter (Signed)
FYI message: Returned call to check on patient. Per patient she seems to be fine right now with no concerns. Patient agrees to call us back with any concerns and will go to ED if symptoms come back or are worse.

## 2021-01-20 NOTE — Telephone Encounter (Signed)
I called pt concerning her wanting to schedule an office visit. She had called our after hours service requesting one. She stated she needing to be seen for joint pain. I asked her where, she made the comment-I'm in extreme pain all over with my left arm going numb. I called Nurse Triage and transferred her over. Pt had hung up, the lady I was speaking with said the nurse would go ahead and call her back now.

## 2021-02-02 ENCOUNTER — Encounter: Payer: Self-pay | Admitting: Nurse Practitioner

## 2021-02-02 ENCOUNTER — Other Ambulatory Visit: Payer: Self-pay

## 2021-02-02 ENCOUNTER — Ambulatory Visit (INDEPENDENT_AMBULATORY_CARE_PROVIDER_SITE_OTHER): Payer: 59 | Admitting: Nurse Practitioner

## 2021-02-02 VITALS — BP 104/60 | HR 60 | Temp 97.7°F | Wt 151.4 lb

## 2021-02-02 DIAGNOSIS — F321 Major depressive disorder, single episode, moderate: Secondary | ICD-10-CM

## 2021-02-02 DIAGNOSIS — J4541 Moderate persistent asthma with (acute) exacerbation: Secondary | ICD-10-CM

## 2021-02-02 DIAGNOSIS — J4530 Mild persistent asthma, uncomplicated: Secondary | ICD-10-CM | POA: Diagnosis not present

## 2021-02-02 MED ORDER — FLUTICASONE PROPIONATE HFA 110 MCG/ACT IN AERO: 1.0000 | INHALATION_SPRAY | Freq: Two times a day (BID) | RESPIRATORY_TRACT | 5 refills | Status: DC

## 2021-02-02 MED ORDER — BUPROPION HCL ER (SR) 150 MG PO TB12: 150.0000 mg | ORAL_TABLET | Freq: Two times a day (BID) | ORAL | 3 refills | Status: DC

## 2021-02-02 NOTE — Patient Instructions (Addendum)
Increase Wellbutrin dose to 150mg  BID Maintain elavil dose.  [facility] states they will be calling you soon.  I will let you know about pulmonary function test.

## 2021-02-02 NOTE — Assessment & Plan Note (Signed)
Stable mood but could be better. Denies any adverse side effects. Still waiting for appt with psychiatry.  Increase wellbutrin dose to 150mg  BID Maintain elavil dose F/up in 63months or sooner if needed

## 2021-02-02 NOTE — Assessment & Plan Note (Signed)
Stable with flovent Has not needed albuterol x 18month Still waiting for PFT appt.  Maintain current inhaler

## 2021-02-02 NOTE — Progress Notes (Signed)
Subjective:  Patient ID: Rebecca Rocha, female    DOB: 05-13-2001  Age: 20 y.o. MRN: [id number]  CC: Follow-up (4 wk f/u depression, asthma and body aches x 2 wks)  HPI  Asthma Stable with flovent Has not needed albuterol x 41month Still waiting for PFT appt.  Maintain current inhaler  Depression, major, single episode, moderate (HCC) Stable mood but could be better. Denies any adverse side effects. Still waiting for appt with psychiatry.  Increase wellbutrin dose to 150mg  BID Maintain elavil dose F/up in 23months or sooner if needed Depression screen [facility] [address] [address] 2/9 02/02/2021 12/17/2020 10/08/2020  Decreased Interest 3 3 1   Down, Depressed, Hopeless 3 3 1   PHQ - 2 Score 6 6 2   Altered sleeping 3 3 3   Tired, decreased energy 3 3 1   Change in appetite 3 3 0  Feeling bad or failure about yourself  2 3 1   Trouble concentrating 3 3 0  Moving slowly or fidgety/restless 1 3 0  Suicidal thoughts 0 1 0  PHQ-9 Score 21 25 7   Difficult doing work/chores Extremely dIfficult Extremely dIfficult Somewhat difficult    Reviewed past Medical, Social and Family history today.  Outpatient Medications Prior to Visit  Medication Sig Dispense Refill   albuterol (PROVENTIL) (2.5 MG/3ML) 0.083% nebulizer solution Take 3 mLs (2.5 mg total) by nebulization every 6 (six) hours as needed for wheezing or shortness of breath. 100 mL 0   albuterol (VENTOLIN HFA) 108 (90 Base) MCG/ACT inhaler 1-2 PUFFS EVERY 6 HOURS FOR WHEEZING OR SHORTNESS OF BREATH. NEED OFFICE VISIT FOR ADDITIONAL REFILL 6.7 each 1   amitriptyline (ELAVIL) 25 MG tablet Take 0.5 tablets (12.5 mg total) by mouth at bedtime. 45 tablet 1   hyoscyamine (ANASPAZ) 0.125 MG TBDP disintergrating tablet Place 1 tablet (0.125 mg total) under the tongue every 6 (six) hours as needed for cramping. 90 tablet 2   medroxyPROGESTERone (DEPO-PROVERA) 150 MG/ML injection Inject 1 mL (150 mg total) into the muscle every 3 (three) months. 1 mL 0    phenazopyridine (PYRIDIUM) 95 MG tablet Take by mouth.     buPROPion (WELLBUTRIN) 100 MG tablet Take 1 tablet (100 mg total) by mouth 2 (two) times daily. 60 tablet 5   fluticasone (FLOVENT HFA) 110 MCG/ACT inhaler Inhale 1 puff into the lungs 2 (two) times daily. Rinse mouth after each use 1 each 3   No facility-administered medications prior to visit.   ROS See HPI  Objective:  BP 104/60   Pulse 60   Temp 97.7 F (36.5 C) (Temporal)   Wt 151 lb 6.4 oz (68.7 kg)   SpO2 98%   BMI 28.61 kg/m   Physical Exam Cardiovascular:     Rate and Rhythm: Normal rate and regular rhythm.     Pulses: Normal pulses.     Heart sounds: Normal heart sounds.  Pulmonary:     Effort: Pulmonary effort is normal.     Breath sounds: Normal breath sounds.  Neurological:     Mental Status: She is oriented to person, place, and time.  Psychiatric:        Mood and Affect: Mood normal.        Behavior: Behavior normal.        Thought Content: Thought content normal.   Assessment & Plan:  This visit occurred during the SARS-CoV-2 public health emergency.  Safety protocols were in place, including screening questions prior to the visit, additional usage of staff PPE, and extensive cleaning of exam room  while observing appropriate contact time as indicated for disinfecting solutions.   Rebecca Rocha was seen today for follow-up.  Diagnoses and all orders for this visit:  Depression, major, single episode, moderate (HCC) -     buPROPion (WELLBUTRIN SR) 150 MG 12 hr tablet; Take 1 tablet (150 mg total) by mouth 2 (two) times daily.  Mild persistent asthma without complication  Moderate persistent asthma with acute exacerbation -     fluticasone (FLOVENT HFA) 110 MCG/ACT inhaler; Inhale 1 puff into the lungs 2 (two) times daily. Rinse mouth after each use  Problem List Items Addressed This Visit       Respiratory   Asthma    Stable with flovent Has not needed albuterol x 47month Still waiting for PFT  appt.  Maintain current inhaler      Relevant Medications   fluticasone (FLOVENT HFA) 110 MCG/ACT inhaler     Other   Depression, major, single episode, moderate (HCC) - Primary    Stable mood but could be better. Denies any adverse side effects. Still waiting for appt with psychiatry.  Increase wellbutrin dose to 150mg  BID Maintain elavil dose F/up in 2months or sooner if needed      Relevant Medications   buPROPion (WELLBUTRIN SR) 150 MG 12 hr tablet    Follow-up: Return in about 6 months (around 08/02/2021) for CPE (fasting).  08/04/2021, NP

## 2021-02-04 ENCOUNTER — Other Ambulatory Visit: Payer: Self-pay | Admitting: Nurse Practitioner

## 2021-02-04 DIAGNOSIS — J4520 Mild intermittent asthma, uncomplicated: Secondary | ICD-10-CM

## 2021-02-09 ENCOUNTER — Other Ambulatory Visit: Payer: Self-pay

## 2021-02-09 ENCOUNTER — Ambulatory Visit (INDEPENDENT_AMBULATORY_CARE_PROVIDER_SITE_OTHER): Payer: 59 | Admitting: Internal Medicine

## 2021-02-09 DIAGNOSIS — J4520 Mild intermittent asthma, uncomplicated: Secondary | ICD-10-CM | POA: Diagnosis not present

## 2021-02-09 LAB — PULMONARY FUNCTION TEST
DL/VA % pred: 109 %
DL/VA: 5.26 ml/min/mmHg/L
DLCO cor % pred: 103 %
DLCO cor: 21.03 ml/min/mmHg
DLCO unc % pred: 103 %
DLCO unc: 21.03 ml/min/mmHg
FEF 25-75 Post: 2.58 L/sec
FEF 25-75 Pre: 2.17 L/sec
FEF2575-%Change-Post: 18 %
FEF2575-%Pred-Post: 72 %
FEF2575-%Pred-Pre: 61 %
FEV1-%Change-Post: 4 %
FEV1-%Pred-Post: 85 %
FEV1-%Pred-Pre: 82 %
FEV1-Post: 2.62 L
FEV1-Pre: 2.52 L
FEV1FVC-%Change-Post: 5 %
FEV1FVC-%Pred-Pre: 91 %
FEV6-%Change-Post: 0 %
FEV6-%Pred-Post: 90 %
FEV6-%Pred-Pre: 90 %
FEV6-Post: 3.16 L
FEV6-Pre: 3.17 L
FEV6FVC-%Change-Post: 0 %
FEV6FVC-%Pred-Post: 99 %
FEV6FVC-%Pred-Pre: 99 %
FVC-%Change-Post: -1 %
FVC-%Pred-Post: 90 %
FVC-%Pred-Pre: 91 %
FVC-Post: 3.16 L
FVC-Pre: 3.2 L
Post FEV1/FVC ratio: 83 %
Post FEV6/FVC ratio: 100 %
Pre FEV1/FVC ratio: 79 %
Pre FEV6/FVC Ratio: 99 %
RV % pred: 117 %
RV: 1.29 L
TLC % pred: 94 %
TLC: 4.42 L

## 2021-02-09 NOTE — Progress Notes (Signed)
PFT done today. 

## 2021-02-15 ENCOUNTER — Ambulatory Visit: Payer: 59

## 2021-03-10 ENCOUNTER — Ambulatory Visit (INDEPENDENT_AMBULATORY_CARE_PROVIDER_SITE_OTHER): Payer: 59 | Admitting: *Deleted

## 2021-03-10 ENCOUNTER — Other Ambulatory Visit: Payer: Self-pay

## 2021-03-10 DIAGNOSIS — Z3042 Encounter for surveillance of injectable contraceptive: Secondary | ICD-10-CM | POA: Diagnosis not present

## 2021-03-10 MED ORDER — MEDROXYPROGESTERONE ACETATE 150 MG/ML IM SUSP
150.0000 mg | Freq: Once | INTRAMUSCULAR | Status: AC
  Administered 2021-03-10: 150 mg via INTRAMUSCULAR

## 2021-03-10 NOTE — Progress Notes (Signed)
Jan 12 th - Jan 26 th

## 2021-03-16 ENCOUNTER — Ambulatory Visit: Payer: 59 | Admitting: Nurse Practitioner

## 2021-04-15 ENCOUNTER — Encounter: Payer: Self-pay | Admitting: Nurse Practitioner

## 2021-04-15 ENCOUNTER — Other Ambulatory Visit: Payer: Self-pay

## 2021-04-15 ENCOUNTER — Ambulatory Visit (INDEPENDENT_AMBULATORY_CARE_PROVIDER_SITE_OTHER): Payer: 59 | Admitting: Nurse Practitioner

## 2021-04-15 VITALS — BP 98/66 | HR 85 | Temp 97.7°F | Ht 61.0 in | Wt 155.0 lb

## 2021-04-15 DIAGNOSIS — G5601 Carpal tunnel syndrome, right upper limb: Secondary | ICD-10-CM

## 2021-04-15 DIAGNOSIS — M7918 Myalgia, other site: Secondary | ICD-10-CM

## 2021-04-15 DIAGNOSIS — G8929 Other chronic pain: Secondary | ICD-10-CM

## 2021-04-15 DIAGNOSIS — J4520 Mild intermittent asthma, uncomplicated: Secondary | ICD-10-CM | POA: Diagnosis not present

## 2021-04-15 MED ORDER — FLUTICASONE PROPIONATE HFA 110 MCG/ACT IN AERO: 1.0000 | INHALATION_SPRAY | Freq: Two times a day (BID) | RESPIRATORY_TRACT | 5 refills | Status: DC

## 2021-04-15 MED ORDER — PREGABALIN ER 82.5 MG PO TB24: 1.0000 | ORAL_TABLET | Freq: Every day | ORAL | 5 refills | Status: DC

## 2021-04-15 NOTE — Patient Instructions (Addendum)
Stop elavil Start lyrica at bedtime. Continue daily exercise (stretch before and after) Maintain heart healthy diet, minimize diary and gluten. Use wrist brace on right wrist daily x 2-3weeks. Provided work note for light duty. F/up in 68month  Low-FODMAP Eating Plan FODMAP stands for fermentable oligosaccharides, disaccharides, monosaccharides, and polyols. These are sugars that are hard for some people to digest. A low-FODMAP eating plan may help some people who have irritable bowel syndrome (IBS) and certain other bowel (intestinal) diseases to manage their symptoms. This meal plan can be complicated to follow. Work with a diet and nutrition specialist (dietitian) to make a low-FODMAP eating plan that is right for you. A dietitian can help make sure that you get enough nutrition from this diet. What are tips for following this plan? Reading food labels Check labels for hidden FODMAPs such as: High-fructose syrup. Honey. Agave. Natural fruit flavors. Onion or garlic powder. Choose low-FODMAP foods that contain 3-4 grams of fiber per serving. Check food labels for serving sizes. Eat only one serving at a time to make sure FODMAP levels stay low. Shopping Shop with a list of foods that are recommended on this diet and make a meal plan. Meal planning Follow a low-FODMAP eating plan for up to 6 weeks, or as told by your health care provider or dietitian. To follow the eating plan: Eliminate high-FODMAP foods from your diet completely. Choose only low-FODMAP foods to eat. You will do this for 2-6 weeks. Gradually reintroduce high-FODMAP foods into your diet one at a time. Most people should wait a few days before introducing the next new high-FODMAP food into their meal plan. Your dietitian can recommend how quickly you may reintroduce foods. Keep a daily record of what and how much you eat and drink. Make note of any symptoms that you have after eating. Review your daily record with a  dietitian regularly to identify which foods you can eat and which foods you should avoid. General tips Drink enough fluid each day to keep your urine pale yellow. Avoid processed foods. These often have added sugar and may be high in FODMAPs. Avoid most dairy products, whole grains, and sweeteners. Work with a dietitian to make sure you get enough fiber in your diet. Avoid high FODMAP foods at meals to manage symptoms. Recommended foods Fruits Bananas, oranges, tangerines, lemons, limes, blueberries, raspberries, strawberries, grapes, cantaloupe, honeydew melon, kiwi, papaya, passion fruit, and pineapple. Limited amounts of dried cranberries, banana chips, and shredded coconut. Vegetables Eggplant, zucchini, cucumber, peppers, green beans, bean sprouts, lettuce, arugula, kale, Swiss chard, spinach, collard greens, bok choy, summer squash, potato, and tomato. Limited amounts of corn, carrot, and sweet potato. Green parts of scallions. Grains Gluten-free grains, such as rice, oats, buckwheat, quinoa, corn, polenta, and millet. Gluten-free pasta, bread, or cereal. Rice noodles. Corn tortillas. Meats and other proteins Unseasoned beef, pork, poultry, or fish. Eggs. Tomasa Blase. Tofu (firm) and tempeh. Limited amounts of nuts and seeds, such as almonds, walnuts, [place] nuts, pecans, peanuts, nut butters, pumpkin seeds, chia seeds, and sunflower seeds. Dairy Lactose-free milk, yogurt, and kefir. Lactose-free cottage cheese and ice cream. Non-dairy milks, such as almond, coconut, hemp, and rice milk. Non-dairy yogurt. Limited amounts of goat cheese, brie, mozzarella, parmesan, swiss, and other hard cheeses. Fats and oils Butter-free spreads. Vegetable oils, such as olive, canola, and sunflower oil. Seasoning and other foods Artificial sweeteners with names that do not end in "ol," such as aspartame, saccharine, and stevia. Maple syrup, white table sugar, raw sugar,  brown sugar, and molasses. Mayonnaise, soy  sauce, and tamari. Fresh basil, coriander, parsley, rosemary, and thyme. Beverages Water and mineral water. Sugar-sweetened soft drinks. Small amounts of orange juice or cranberry juice. Black and green tea. Most dry wines. Coffee. The items listed above may not be a complete list of foods and beverages you can eat. Contact a dietitian for more information. Foods to avoid Fruits Fresh, dried, and juiced forms of apple, pear, watermelon, peach, plum, cherries, apricots, blackberries, boysenberries, figs, nectarines, and mango. Avocado. Vegetables Chicory root, artichoke, asparagus, cabbage, snow peas, Brussels sprouts, broccoli, sugar snap peas, mushrooms, celery, and cauliflower. Onions, garlic, leeks, and the white part of scallions. Grains Wheat, including kamut, durum, and semolina. Barley and bulgur. Couscous. Wheat-based cereals. Wheat noodles, bread, crackers, and pastries. Meats and other proteins Fried or fatty meat. Sausage. Cashews and pistachios. Soybeans, baked beans, black beans, chickpeas, kidney beans, fava beans, navy beans, lentils, black-eyed peas, and split peas. Dairy Milk, yogurt, ice cream, and soft cheese. Cream and sour cream. Milk-based sauces. Custard. Buttermilk. Soy milk. Seasoning and other foods Any sugar-free gum or candy. Foods that contain artificial sweeteners such as sorbitol, mannitol, isomalt, or xylitol. Foods that contain honey, high-fructose corn syrup, or agave. Bouillon, vegetable stock, beef stock, and chicken stock. Garlic and onion powder. Condiments made with onion, such as hummus, chutney, pickles, relish, salad dressing, and salsa. Tomato paste. Beverages Chicory-based drinks. Coffee substitutes. Chamomile tea. Fennel tea. Sweet or fortified wines such as port or sherry. Diet soft drinks made with isomalt, mannitol, maltitol, sorbitol, or xylitol. Apple, pear, and mango juice. Juices with high-fructose corn syrup. The items listed above may not be a  complete list of foods and beverages you should avoid. Contact a dietitian for more information. Summary FODMAP stands for fermentable oligosaccharides, disaccharides, monosaccharides, and polyols. These are sugars that are hard for some people to digest. A low-FODMAP eating plan is a short-term diet that helps to ease symptoms of certain bowel diseases. The eating plan usually lasts up to 6 weeks. After that, high-FODMAP foods are reintroduced gradually and one at a time. This can help you find out which foods may be causing symptoms. A low-FODMAP eating plan can be complicated. It is best to work with a dietitian who has experience with this type of plan. This information is not intended to replace advice given to you by your health care provider. Make sure you discuss any questions you have with your health care provider. Document Revised: 09/18/2019 Document Reviewed: 09/18/2019 Elsevier Patient Education  2022 [employer].

## 2021-04-15 NOTE — Assessment & Plan Note (Signed)
Onset 12/2020, generalized muscle pain, worsening Worse with inactivity, improves with activity and exercise No joint pain or stiffness. Labs completed by my colleague in August( ANA, ESR, Rheumatoid factor, cmp, CK, CRP): all normal.  Stop elavil Start lyrica at bedtime. Continue daily exercise (stretch before and after) Maintain heart healthy diet, minimize diary and gluten. F/up in 77month

## 2021-04-15 NOTE — Progress Notes (Signed)
Subjective:  Patient ID: Rebecca Rocha, female    DOB: November 19, 2000  Age: 20 y.o. MRN: [id number]  CC: Pain (Body aches, pain in hands fingers contracting at night symptoms x 1 month. )  Muscle Pain This is a chronic problem. The current episode started more than 1 month ago. The problem occurs constantly. The problem has been waxing and waning since onset. The context of the pain is unknown. Pain location: generalized muscle pain. The pain is severe. The symptoms are aggravated by inactivity. Associated symptoms include fatigue. Pertinent negatives include no fever, joint swelling, rash, stiffness, sensory change, swollen glands or weakness. Treatments tried: exercise. The treatment provided significant relief. There is no swelling present. She has been Behaving normally. There is no history of chronic back pain, rheumatic disease or sickle cell disease.  Wrist Pain  The pain is present in the right wrist and right fingers. This is a new problem. The current episode started 1 to 4 weeks ago. There has been no history of extremity trauma. The problem occurs constantly. The problem has been unchanged. The quality of the pain is described as aching. The pain is moderate. Associated symptoms include joint locking. Pertinent negatives include no fever, inability to bear weight, itching, joint swelling, limited range of motion or stiffness. The symptoms are aggravated by activity. She has tried rest for the symptoms. The treatment provided mild relief. Family history does not include gout or rheumatoid arthritis. There is no history of diabetes, gout, osteoarthritis or rheumatoid arthritis.   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 Labs completed by my colleague in August( ANA, ESR, Rheumatoid factor, cmp, CK, CRP): all normal.   Reviewed past Medical, Social and Family history today.  Outpatient Medications Prior to Visit  Medication Sig Dispense Refill    albuterol (PROVENTIL) (2.5 MG/3ML) 0.083% nebulizer solution Take 3 mLs (2.5 mg total) by nebulization every 6 (six) hours as needed for wheezing or shortness of breath. 100 mL 0   albuterol (VENTOLIN HFA) 108 (90 Base) MCG/ACT inhaler 1-2 PUFFS EVERY 6 HOURS FOR WHEEZING OR SHORTNESS OF BREATH. NEED OFFICE VISIT FOR ADDITIONAL REFILL 6.7 each 1   buPROPion (WELLBUTRIN SR) 150 MG 12 hr tablet Take 1 tablet (150 mg total) by mouth 2 (two) times daily. 180 tablet 3   hyoscyamine (ANASPAZ) 0.125 MG TBDP disintergrating tablet Place 1 tablet (0.125 mg total) under the tongue every 6 (six) hours as needed for cramping. 90 tablet 2   medroxyPROGESTERone (DEPO-PROVERA) 150 MG/ML injection Inject 1 mL (150 mg total) into the muscle every 3 (three) months. 1 mL 0   phenazopyridine (PYRIDIUM) 95 MG tablet Take by mouth.     amitriptyline (ELAVIL) 25 MG tablet Take 0.5 tablets (12.5 mg total) by mouth at bedtime. 45 tablet 1   fluticasone (FLOVENT HFA) 110 MCG/ACT inhaler Inhale 1 puff into the lungs 2 (two) times daily. Rinse mouth after each use 1 each 5   No facility-administered medications prior to visit.   ROS See HPI  Objective:  BP 98/66 (BP Location: Left Arm, Patient Position: Sitting, Cuff Size: Normal)   Pulse 85   Temp 97.7 F (36.5 C) (Temporal)   Ht 5\' 1"  (1.549 m)   Wt 155 lb (70.3 kg)   SpO2 99%   BMI 29.29 kg/m   Physical Exam Vitals reviewed.  Constitutional:      General: She is not in acute distress. Cardiovascular:     Rate and Rhythm: Normal rate.  Pulses: Normal pulses.  Pulmonary:     Effort: Pulmonary effort is normal.  Musculoskeletal:     Right shoulder: Normal.     Right upper arm: Normal.     Right elbow: Normal.     Right forearm: Normal.     Right wrist: Tenderness present. No swelling, effusion, bony tenderness, snuff box tenderness or crepitus. Normal range of motion. Normal pulse.     Right hand: Normal.     Cervical back: Normal range of motion and  neck supple. Tenderness present. No spasms, bony tenderness or crepitus. No pain with movement. Normal range of motion.     Thoracic back: Tenderness present. No swelling, edema, spasms or bony tenderness. Normal range of motion.     Lumbar back: Tenderness present. No swelling, edema or spasms. Normal range of motion. Negative right straight leg raise test and negative left straight leg raise test.     Right hip: Tenderness present. No bony tenderness or crepitus. Normal range of motion. Normal strength.     Left hip: Tenderness present. No bony tenderness or crepitus. Normal range of motion. Normal strength.     Comments: Positive R. Wrist phalen test, radiating pain to 2nd and 4th fingers.  Lymphadenopathy:     Cervical: No cervical adenopathy.  Neurological:     Mental Status: She is alert and oriented to person, place, and time.   Assessment & Plan:  This visit occurred during the SARS-CoV-2 public health emergency.  Safety protocols were in place, including screening questions prior to the visit, additional usage of staff PPE, and extensive cleaning of exam room while observing appropriate contact time as indicated for disinfecting solutions.   Rebecca Rocha was seen today for pain.  Diagnoses and all orders for this visit:  Chronic myofascial pain -     Pregabalin ER (LYRICA CR) 82.5 MG TB24; Take 1 tablet by mouth at bedtime.  Mild intermittent asthma without complication -     fluticasone (FLOVENT HFA) 110 MCG/ACT inhaler; Inhale 1 puff into the lungs 2 (two) times daily. Rinse mouth after each use  Carpal tunnel syndrome of right wrist  Stop elavil Start lyrica at bedtime. Continue daily exercise (stretch before and after) Maintain heart healthy diet, minimize diary and gluten. Use wrist brace on right wrist daily x 2-3weeks. Provided work note for light duty. F/up in 70month  Problem List Items Addressed This Visit       Respiratory   Asthma   Relevant Medications    fluticasone (FLOVENT HFA) 110 MCG/ACT inhaler     Other   Chronic myofascial pain - Primary    Onset 12/2020, generalized muscle pain, worsening Worse with inactivity, improves with activity and exercise No joint pain or stiffness. Labs completed by my colleague in August( ANA, ESR, Rheumatoid factor, cmp, CK, CRP): all normal.  Stop elavil Start lyrica at bedtime. Continue daily exercise (stretch before and after) Maintain heart healthy diet, minimize diary and gluten. F/up in 2month      Relevant Medications   Pregabalin ER (LYRICA CR) 82.5 MG TB24   Other Visit Diagnoses     Carpal tunnel syndrome of right wrist       Relevant Medications   Pregabalin ER (LYRICA CR) 82.5 MG TB24       Follow-up: Return in about 4 weeks (around 05/13/2021) for myofascial pain and depression.  Wilfred Lacy, NP

## 2021-04-18 ENCOUNTER — Telehealth: Payer: Self-pay | Admitting: Nurse Practitioner

## 2021-04-18 DIAGNOSIS — M7918 Myalgia, other site: Secondary | ICD-10-CM

## 2021-04-18 NOTE — Telephone Encounter (Signed)
Insurance is required prior auth for Pregabalin ER (LYRICA CR) 82.5 MG TB . Pt notes that form was sent to office 12/2 in the afternoon. Pt verified still active with [facility] insurance.   Pt requesting call back [phone].

## 2021-04-18 NOTE — Telephone Encounter (Signed)
Patient reports trying several other medication for this condition but was unable to tell me the names of them.   Form on providers desk to finish filling out and sign.  [facility]

## 2021-04-18 NOTE — Telephone Encounter (Signed)
Left VM to rtn call. Form placed on provider's desk for signature then will fax to insurance.  Dm/cma

## 2021-04-26 NOTE — Telephone Encounter (Signed)
Pt is wanting a script similar to Pregabalin ER (LYRICA CR) 82.5 MG TB24 [[id number]] that her insurance has denied.  [id number] [place], [place] [phone] AT [facility] [address]  [address] [place] [address]  Phone:  [phone]  Fax:  [phone]  DEA #:  [id number]

## 2021-04-29 ENCOUNTER — Ambulatory Visit: Payer: 59 | Admitting: Nurse Practitioner

## 2021-05-02 ENCOUNTER — Telehealth: Payer: 59 | Admitting: Physician Assistant

## 2021-05-02 ENCOUNTER — Telehealth: Payer: Self-pay | Admitting: Nurse Practitioner

## 2021-05-02 ENCOUNTER — Encounter: Payer: Self-pay | Admitting: Physician Assistant

## 2021-05-02 DIAGNOSIS — M797 Fibromyalgia: Secondary | ICD-10-CM | POA: Diagnosis not present

## 2021-05-02 MED ORDER — NAPROXEN SODIUM 550 MG PO TABS: 550.0000 mg | ORAL_TABLET | Freq: Two times a day (BID) | ORAL | 0 refills | Status: DC

## 2021-05-02 NOTE — Progress Notes (Signed)
Virtual Visit Consent   Rebecca Rocha, you are scheduled for a virtual visit with a [place] provider today.     Just as with appointments in the office, your consent must be obtained to participate.  Your consent will be active for this visit and any virtual visit you may have with one of our providers in the next 365 days.     If you have a MyChart account, a copy of this consent can be sent to you electronically.  All virtual visits are billed to your insurance company just like a traditional visit in the office.    As this is a virtual visit, video technology does not allow for your provider to perform a traditional examination.  This may limit your provider's ability to fully assess your condition.  If your provider identifies any concerns that need to be evaluated in person or the need to arrange testing (such as labs, EKG, etc.), we will make arrangements to do so.     Although advances in technology are sophisticated, we cannot ensure that it will always work on either your end or our end.  If the connection with a video visit is poor, the visit may have to be switched to a telephone visit.  With either a video or telephone visit, we are not always able to ensure that we have a secure connection.     I need to obtain your verbal consent now.   Are you willing to proceed with your visit today?    Rebecca Rocha has provided verbal consent on 05/02/2021 for a virtual visit (video or telephone).   [name], PA-C   Date: 05/02/2021 2:39 PM   Virtual Visit via Video Note   I, [name], connected with  Rebecca Rocha  ([id number], [birth year]) on 05/02/21 at  2:30 PM EST by a video-enabled telemedicine application and verified that I am speaking with the correct person using two identifiers.  Location: Patient: Virtual Visit Location Patient: Mobile Provider: Virtual Visit Location Provider: Home Office   I discussed the  limitations of evaluation and management by telemedicine and the availability of in person appointments. The patient expressed understanding and agreed to proceed.    History of Present Illness: Rebecca Rocha is a 20 y.o. who identifies as a female who was assigned female at birth, and is being seen today for fibromyalgia. Having increased pains, and difficulty grasping things. Was recently diagnosed with fibromyalgia. Has had Lyrica CR 82.5mg  prescribed, but she has been unable to get this medication from the pharmacy as it is going through insurance authorization. She has been taking aleve TID without relief.     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 NEED OFFICE VISIT FOR ADDITIONAL REFILL, Disp: 6.7 each, Rfl: 1   buPROPion (WELLBUTRIN SR) 150 MG 12 hr tablet, Take 1 tablet (150 mg total) by mouth 2 (two) times daily., Disp: 180 tablet, Rfl: 3   fluticasone (FLOVENT HFA)  110 MCG/ACT inhaler, Inhale 1 puff into the lungs 2 (two) times daily. Rinse mouth after each use, Disp: 1 each, Rfl: 5   hyoscyamine (ANASPAZ) 0.125 MG TBDP disintergrating tablet, Place 1 tablet (0.125 mg total) under the tongue every 6 (six) hours as needed for cramping., Disp: 90 tablet, Rfl: 2   medroxyPROGESTERone (DEPO-PROVERA) 150 MG/ML injection, Inject 1 mL (150 mg total) into the muscle every 3 (three) months., Disp: 1 mL, Rfl: 0   phenazopyridine (PYRIDIUM) 95 MG tablet, Take by mouth., Disp: , Rfl:    Pregabalin ER (LYRICA CR) 82.5 MG TB24, Take 1 tablet by mouth at bedtime., Disp: 30 tablet, Rfl: 5  Observations/Objective: Patient is well-developed, well-nourished in no acute distress.  Resting comfortably at home.  Head is normocephalic, atraumatic.  No labored breathing.  Speech is clear and coherent with logical content.  Patient is alert and oriented at baseline.    Assessment and Plan: 1. Fibromyalgia - naproxen sodium (ANAPROX DS) 550 MG tablet; Take 1 tablet (550 mg total) by mouth 2 (two) times daily with a meal.  Dispense: 60 tablet; Refill: 0  - Advised we were unable to change medications previously prescribed by PCP - Will provide Naproxen as noted above for inflammation - Advised to f/u with PCP to notify of insurance issues and see if medication may need to be changed  Follow Up Instructions: I discussed the assessment and treatment plan with the patient. The patient was provided an opportunity to ask questions and all were answered. The patient agreed with the plan and demonstrated an understanding of the instructions.  A copy of instructions were sent to the patient via MyChart unless otherwise noted below.    The patient was advised to call back or seek an in-person evaluation if the symptoms worsen or if the condition fails to improve as anticipated.  Time:  I spent 10 minutes with the patient via telehealth technology discussing the above  problems/concerns.    [name], PA-C

## 2021-05-02 NOTE — Patient Instructions (Signed)
Rebecca Rocha, thank you for joining [name], PA-C for today's virtual visit.  While this provider is not your primary care provider (PCP), if your PCP is located in our provider database this encounter information will be shared with them immediately following your visit.  Consent: (Patient) Rebecca Rocha provided verbal consent for this virtual visit at the beginning of the encounter.  Current Medications:  Current Outpatient Medications:    naproxen sodium (ANAPROX DS) 550 MG tablet, Take 1 tablet (550 mg total) by mouth 2 (two) times daily with a meal., Disp: 60 tablet, Rfl: 0   albuterol (PROVENTIL) (2.5 MG/3ML) 0.083% nebulizer solution, Take 3 mLs (2.5 mg total) by nebulization every 6 (six) hours as needed for wheezing or shortness of breath., Disp: 100 mL, Rfl: 0   albuterol (VENTOLIN HFA) 108 (90 Base) MCG/ACT inhaler, 1-2 PUFFS EVERY 6 HOURS FOR WHEEZING OR SHORTNESS OF BREATH. NEED OFFICE VISIT FOR ADDITIONAL REFILL, Disp: 6.7 each, Rfl: 1   buPROPion (WELLBUTRIN SR) 150 MG 12 hr tablet, Take 1 tablet (150 mg total) by mouth 2 (two) times daily., Disp: 180 tablet, Rfl: 3   fluticasone (FLOVENT HFA) 110 MCG/ACT inhaler, Inhale 1 puff into the lungs 2 (two) times daily. Rinse mouth after each use, Disp: 1 each, Rfl: 5   hyoscyamine (ANASPAZ) 0.125 MG TBDP disintergrating tablet, Place 1 tablet (0.125 mg total) under the tongue every 6 (six) hours as needed for cramping., Disp: 90 tablet, Rfl: 2   medroxyPROGESTERone (DEPO-PROVERA) 150 MG/ML injection, Inject 1 mL (150 mg total) into the muscle every 3 (three) months., Disp: 1 mL, Rfl: 0   phenazopyridine (PYRIDIUM) 95 MG tablet, Take by mouth., Disp: , Rfl:    Pregabalin ER (LYRICA CR) 82.5 MG TB24, Take 1 tablet by mouth at bedtime., Disp: 30 tablet, Rfl: 5   Medications ordered in this encounter:  Meds ordered this encounter  Medications   naproxen sodium (ANAPROX DS) 550 MG tablet    Sig: Take  1 tablet (550 mg total) by mouth 2 (two) times daily with a meal.    Dispense:  60 tablet    Refill:  0    Order Specific Question:   Supervising Provider    Answer:   Eber Hong [[id number]]     *If you need refills on other medications prior to your next appointment, please contact your pharmacy*  Follow-Up: Call back or seek an in-person evaluation if the symptoms worsen or if the condition fails to improve as anticipated.  Other Instructions Myofascial Pain Syndrome and Fibromyalgia Myofascial pain syndrome and fibromyalgia are both pain disorders. This pain may be felt mainly in your muscles. Myofascial pain syndrome: Always has tender points in the muscle that will cause pain when pressed (trigger points). The pain may come and go. Usually affects your neck, upper back, and shoulder areas. The pain often radiates into your arms and hands. Fibromyalgia: Has muscle pains and tenderness that come and go. Is often associated with fatigue and sleep problems. Has trigger points. Tends to be long-lasting (chronic), but is not life-threatening. Fibromyalgia and myofascial pain syndrome are not the same. However, they often occur together. If you have both conditions, each can make the other worse. Both are common and can cause enough pain and fatigue to make day-to-day activities difficult. Both can be hard to diagnose because their symptoms are common in many other conditions. What are the causes? The exact causes of these conditions are not known. What increases the risk?  You are more likely to develop this condition if: You have a family history of the condition. You have certain triggers, such as: Spine disorders. An injury (trauma) or other physical stressors. Being under a lot of stress. Medical conditions such as osteoarthritis, rheumatoid arthritis, or lupus. What are the signs or symptoms? Fibromyalgia The main symptom of fibromyalgia is widespread pain and tenderness in your  muscles. Pain is sometimes described as stabbing, shooting, or burning. You may also have: Tingling or numbness. Sleep problems and fatigue. Problems with attention and concentration (fibro fog). Other symptoms may include:  Bowel and bladder problems. Headaches. Visual problems. Problems with odors and noises. Depression or mood changes. Painful menstrual periods (dysmenorrhea). Dry skin or eyes. These symptoms can vary over time. Myofascial pain syndrome Symptoms of myofascial pain syndrome include: Tight, ropy bands of muscle. Uncomfortable sensations in muscle areas. These may include aching, cramping, burning, numbness, tingling, and weakness. Difficulty moving certain parts of the body freely (poor range of motion). How is this diagnosed? This condition may be diagnosed by your symptoms and medical history. You will also have a physical exam. In general: Fibromyalgia is diagnosed if you have pain, fatigue, and other symptoms for more than 3 months, and symptoms cannot be explained by another condition. Myofascial pain syndrome is diagnosed if you have trigger points in your muscles, and those trigger points are tender and cause pain elsewhere in your body (referred pain). How is this treated? Treatment for these conditions depends on the type that you have. For fibromyalgia: Pain medicines, such as NSAIDs. Medicines for treating depression. Medicines for treating seizures. Medicines that relax the muscles. For myofascial pain: Pain medicines, such as NSAIDs. Cooling and stretching of muscles. Trigger point injections. Sound wave (ultrasound) treatments to stimulate muscles. Treating these conditions often requires a team of health care providers. These may include: Your primary care provider. Physical therapist. Complementary health care providers, such as massage therapists or acupuncturists. Psychiatrist for cognitive behavioral therapy. Follow these instructions at  home: Medicines Take over-the-counter and prescription medicines only as told by your health care provider. Do not drive or use heavy machinery while taking prescription pain medicine. If you are taking prescription pain medicine, take actions to prevent or treat constipation. Your health care provider may recommend that you: Drink enough fluid to keep your urine pale yellow. Eat foods that are high in fiber, such as fresh fruits and vegetables, whole grains, and beans. Limit foods that are high in fat and processed sugars, such as fried or sweet foods. Take an over-the-counter or prescription medicine for constipation. Lifestyle  Exercise as directed by your health care provider or physical therapist. Practice relaxation techniques to control your stress. You may want to try: Biofeedback. Visual imagery. Hypnosis. Muscle relaxation. Yoga. Meditation. Maintain a healthy lifestyle. This includes eating a healthy diet and getting enough sleep. Do not use any products that contain nicotine or tobacco, such as cigarettes and e-cigarettes. If you need help quitting, ask your health care provider. General instructions Talk to your health care provider about complementary treatments, such as acupuncture or massage. Consider joining a support group with others who are diagnosed with this condition. Do not do activities that stress or strain your muscles. This includes repetitive motions and heavy lifting. Keep all follow-up visits as told by your health care provider. This is important. Where to find more information National Fibromyalgia Association: www.fmaware.org Arthritis Foundation: www.arthritis.org American Chronic Pain Association: www.theacpa.org Contact a health care provider if: You  have new symptoms. Your symptoms get worse or your pain is severe. You have side effects from your medicines. You have trouble sleeping. Your condition is causing depression or  anxiety. Summary Myofascial pain syndrome and fibromyalgia are pain disorders. Myofascial pain syndrome has tender points in the muscle that will cause pain when pressed (trigger points). Fibromyalgia also has muscle pains and tenderness that come and go, but this condition is often associated with fatigue and sleep disturbances. Fibromyalgia and myofascial pain syndrome are not the same but often occur together, causing pain and fatigue that make day-to-day activities difficult. Treatment for fibromyalgia includes taking medicines to relax the muscles and medicines for pain, depression, or seizures. Treatment for myofascial pain syndrome includes taking medicines for pain, cooling and stretching of muscles, and injecting medicines into trigger points. Follow your health care provider's instructions for taking medicines and maintaining a healthy lifestyle. This information is not intended to replace advice given to you by your health care provider. Make sure you discuss any questions you have with your health care provider. Document Revised: 08/23/2018 Document Reviewed: 05/16/2017 Elsevier Patient Education  2022 [employer].    If you have been instructed to have an in-person evaluation today at a local Urgent Care facility, please use the link below. It will take you to a list of all of our available [facility], including address, phone number and hours of operation. Please do not delay care.  [facility]  If you or a family member do not have a primary care provider, use the link below to schedule a visit and establish care. When you choose a [facility] primary care physician or advanced practice provider, you gain a long-term partner in health. Find a Primary Care Provider  Learn more about [facility]'s in-office and virtual care options: [facility] - Get Care Now

## 2021-05-04 MED ORDER — GABAPENTIN 100 MG PO CAPS: ORAL_CAPSULE | ORAL | 5 refills | Status: DC

## 2021-05-04 NOTE — Telephone Encounter (Signed)
Rx switched on 05/04/21

## 2021-05-04 NOTE — Addendum Note (Signed)
Addended by: Michaela Corner on: 05/04/2021 09:43 AM   Modules accepted: Orders

## 2021-05-04 NOTE — Telephone Encounter (Signed)
Called and lvm, new rx sent to pharmacy.

## 2021-05-26 ENCOUNTER — Other Ambulatory Visit: Payer: Self-pay

## 2021-05-26 ENCOUNTER — Ambulatory Visit (INDEPENDENT_AMBULATORY_CARE_PROVIDER_SITE_OTHER): Payer: 59 | Admitting: Gynecology

## 2021-05-26 DIAGNOSIS — Z3042 Encounter for surveillance of injectable contraceptive: Secondary | ICD-10-CM

## 2021-05-26 MED ORDER — MEDROXYPROGESTERONE ACETATE 150 MG/ML IM SUSP
150.0000 mg | Freq: Once | INTRAMUSCULAR | Status: AC
  Administered 2021-05-26: 150 mg via INTRAMUSCULAR

## 2021-05-30 ENCOUNTER — Other Ambulatory Visit: Payer: Self-pay | Admitting: Physician Assistant

## 2021-05-30 DIAGNOSIS — M797 Fibromyalgia: Secondary | ICD-10-CM

## 2021-05-31 ENCOUNTER — Encounter (HOSPITAL_BASED_OUTPATIENT_CLINIC_OR_DEPARTMENT_OTHER): Payer: Self-pay | Admitting: Emergency Medicine

## 2021-05-31 ENCOUNTER — Other Ambulatory Visit: Payer: Self-pay | Admitting: Nurse Practitioner

## 2021-05-31 ENCOUNTER — Other Ambulatory Visit: Payer: Self-pay

## 2021-05-31 ENCOUNTER — Telehealth (INDEPENDENT_AMBULATORY_CARE_PROVIDER_SITE_OTHER): Payer: 59 | Admitting: Nurse Practitioner

## 2021-05-31 ENCOUNTER — Emergency Department (HOSPITAL_BASED_OUTPATIENT_CLINIC_OR_DEPARTMENT_OTHER)
Admission: EM | Admit: 2021-05-31 | Discharge: 2021-05-31 | Disposition: A | Discharge status: Home / Self Care | Payer: 59 | Attending: Emergency Medicine | Admitting: Emergency Medicine

## 2021-05-31 ENCOUNTER — Encounter: Payer: Self-pay | Admitting: Nurse Practitioner

## 2021-05-31 VITALS — BP 140/83 | Temp 98.2°F | Ht 61.0 in | Wt 151.0 lb

## 2021-05-31 DIAGNOSIS — Z7951 Long term (current) use of inhaled steroids: Secondary | ICD-10-CM | POA: Diagnosis not present

## 2021-05-31 DIAGNOSIS — M545 Low back pain, unspecified: Secondary | ICD-10-CM | POA: Diagnosis not present

## 2021-05-31 DIAGNOSIS — R112 Nausea with vomiting, unspecified: Secondary | ICD-10-CM | POA: Diagnosis not present

## 2021-05-31 DIAGNOSIS — M7918 Myalgia, other site: Secondary | ICD-10-CM | POA: Diagnosis not present

## 2021-05-31 DIAGNOSIS — F321 Major depressive disorder, single episode, moderate: Secondary | ICD-10-CM

## 2021-05-31 DIAGNOSIS — R1013 Epigastric pain: Secondary | ICD-10-CM | POA: Diagnosis present

## 2021-05-31 DIAGNOSIS — G8929 Other chronic pain: Secondary | ICD-10-CM | POA: Diagnosis not present

## 2021-05-31 DIAGNOSIS — Z09 Encounter for follow-up examination after completed treatment for conditions other than malignant neoplasm: Secondary | ICD-10-CM

## 2021-05-31 LAB — CBC
HCT: 41.2 % (ref 36.0–46.0)
Hemoglobin: 13.2 g/dL (ref 12.0–15.0)
MCH: 29.7 pg (ref 26.0–34.0)
MCHC: 32 g/dL (ref 30.0–36.0)
MCV: 92.8 fL (ref 80.0–100.0)
Platelets: 399 10*3/uL (ref 150–400)
RBC: 4.44 MIL/uL (ref 3.87–5.11)
RDW: 11.7 % (ref 11.5–15.5)
WBC: 9.9 10*3/uL (ref 4.0–10.5)
nRBC: 0 % (ref 0.0–0.2)

## 2021-05-31 LAB — URINALYSIS, ROUTINE W REFLEX MICROSCOPIC
Bilirubin Urine: NEGATIVE
Glucose, UA: NEGATIVE mg/dL
Ketones, ur: NEGATIVE mg/dL
Leukocytes,Ua: NEGATIVE
Nitrite: NEGATIVE
Protein, ur: NEGATIVE mg/dL
Specific Gravity, Urine: 1.011 (ref 1.005–1.030)
pH: 7 (ref 5.0–8.0)

## 2021-05-31 LAB — COMPREHENSIVE METABOLIC PANEL
ALT: 16 U/L (ref 0–44)
AST: 16 U/L (ref 15–41)
Albumin: 4.2 g/dL (ref 3.5–5.0)
Alkaline Phosphatase: 61 U/L (ref 38–126)
Anion gap: 10 (ref 5–15)
BUN: 15 mg/dL (ref 6–20)
CO2: 23 mmol/L (ref 22–32)
Calcium: 9.1 mg/dL (ref 8.9–10.3)
Chloride: 103 mmol/L (ref 98–111)
Creatinine, Ser: 0.72 mg/dL (ref 0.44–1.00)
GFR, Estimated: >60 mL/min (ref >60)
Glucose, Bld: 92 mg/dL (ref 70–99)
Potassium: 3.7 mmol/L (ref 3.5–5.1)
Sodium: 136 mmol/L (ref 135–145)
Total Bilirubin: 0.5 mg/dL (ref 0.3–1.2)
Total Protein: 7.7 g/dL (ref 6.5–8.1)

## 2021-05-31 LAB — PREGNANCY, URINE: Preg Test, Ur: NEGATIVE

## 2021-05-31 LAB — LIPASE, BLOOD: Lipase: 16 U/L (ref 11–51)

## 2021-05-31 MED ORDER — ALUM & MAG HYDROXIDE-SIMETH 200-200-20 MG/5ML PO SUSP
30.0000 mL | Freq: Once | ORAL | Status: AC
Start: 2021-05-31 — End: 2021-05-31
  Administered 2021-05-31: 30 mL via ORAL
  Filled 2021-05-31: qty 30

## 2021-05-31 MED ORDER — ONDANSETRON HCL 4 MG PO TABS: 4.0000 mg | ORAL_TABLET | Freq: Four times a day (QID) | ORAL | 0 refills | Status: DC | PRN

## 2021-05-31 MED ORDER — ONDANSETRON HCL 4 MG/2ML IJ SOLN
4.0000 mg | Freq: Once | INTRAMUSCULAR | Status: AC
  Administered 2021-05-31: 4 mg via INTRAVENOUS
  Filled 2021-05-31: qty 2

## 2021-05-31 MED ORDER — LIDOCAINE VISCOUS HCL 2 % MT SOLN
15.0000 mL | Freq: Once | OROMUCOSAL | Status: AC
  Administered 2021-05-31: 15 mL via ORAL
  Filled 2021-05-31: qty 15

## 2021-05-31 MED ORDER — PANTOPRAZOLE SODIUM 40 MG PO TBEC: 40.0000 mg | DELAYED_RELEASE_TABLET | Freq: Every day | ORAL | 3 refills | Status: DC

## 2021-05-31 MED ORDER — SUCRALFATE 1 GM/10ML PO SUSP: 1.0000 g | Freq: Three times a day (TID) | ORAL | 0 refills | Status: DC

## 2021-05-31 MED ORDER — FAMOTIDINE IN NACL 20-0.9 MG/50ML-% IV SOLN
20.0000 mg | Freq: Once | INTRAVENOUS | Status: AC
  Administered 2021-05-31: 20 mg via INTRAVENOUS
  Filled 2021-05-31 (×2): qty 50

## 2021-05-31 MED ORDER — SODIUM CHLORIDE 0.9 % IV BOLUS
1000.0000 mL | Freq: Once | INTRAVENOUS | Status: AC
  Administered 2021-05-31: 1000 mL via INTRAVENOUS

## 2021-05-31 MED ORDER — GABAPENTIN 100 MG PO CAPS: ORAL_CAPSULE | ORAL | 5 refills | Status: DC

## 2021-05-31 NOTE — ED Triage Notes (Signed)
Pt presents for generalized abd pain since yesterday, was told to take pepto by virtual care yesterday. Today, emesis x1 after eating, low back pain while lying down. Denies urinary sx, vaginal bleeding, blood in stools, syncope.  H/o cholecystectomy, fibromyalgia, swallowing problem (last endoscopy 1 yr ago)

## 2021-05-31 NOTE — Progress Notes (Signed)
Virtual Visit via Video Note  I connected withNAME@ on 05/31/21 at  2:00 PM EST by a video enabled telemedicine application and verified that I am speaking with the correct person using two identifiers.  Location: Patient:Home Provider: Office Participants: patient and provider  I discussed the limitations of evaluation and management by telemedicine and the availability of in person appointments. I also discussed with the patient that there may be a patient responsible charge related to this service. The patient expressed understanding and agreed to proceed.  CC:1 month f/u on myofascia pain and depression. Pt states she has not had any improvement since her last visit.   History of Present Illness: Chronic myofascial pain Unable to afford lyrica (PA declined) Some improvement with gabapentin 1cap in AM and 2caps at HS. Denies any adverse side effects. Maintain daily cardio and strength training exercise routine Has not made any dietary changes Advised to incorporate daily stretching e.g yoga. Increase gabapentin to 100mg  in AM and noon, 200mg  at hs. G/up in 27month  Depression, major, single episode, moderate (HCC) Stable but no improvement with wellbutrin Entered referral to psychiatry and psychotherapy.    Observations/Objective: Physical Exam Pulmonary:     Effort: Pulmonary effort is normal.  Neurological:     Mental Status: She is alert and oriented to person, place, and time.  Psychiatric:        Attention and Perception: Attention normal.        Mood and Affect: Mood is depressed. Affect is flat.        Speech: Speech normal.        Behavior: Behavior is cooperative.        Thought Content: Thought content normal.        Cognition and Memory: Cognition and memory normal.    Assessment and Plan: Khi was seen today for follow-up.  Diagnoses and all orders for this visit:  Chronic myofascial pain -     gabapentin (NEURONTIN) 100 MG capsule; Take 1cap in AM,  1cap in PM and 2cap at bedtime. -     Ambulatory referral to Psychology -     Ambulatory referral to Psychiatry  Depression, major, single episode, moderate ([place]) -     Ambulatory referral to Psychology -     Ambulatory referral to Psychiatry   Follow Up Instructions: See above   I discussed the assessment and treatment plan with the patient. The patient was provided an opportunity to ask questions and all were answered. The patient agreed with the plan and demonstrated an understanding of the instructions.   The patient was advised to call back or seek an in-person evaluation if the symptoms worsen or if the condition fails to improve as anticipated.  Wilfred Lacy, NP

## 2021-05-31 NOTE — Assessment & Plan Note (Signed)
Stable but no improvement with wellbutrin Entered referral to psychiatry and psychotherapy.

## 2021-05-31 NOTE — ED Provider Notes (Signed)
[facility] EMERGENCY DEPT Provider Note   CSN: [id number] Arrival date & time: 05/31/21  0247     History  Chief Complaint  Patient presents with   Abdominal Pain    Rebecca Rocha is a 21 y.o. female.  Patient presents to the emergency department for evaluation of upper abdominal pain.  Patient reports that the pain began yesterday.  She has had generalized nausea and has had 1 episode of vomiting, nonbloody.  Patient did have previous cholecystectomy.  Patient complaining of some pain into her back.  She has not had any urinary symptoms, vaginal discharge or bleeding.      Home Medications Prior to Admission medications   Medication Sig Start Date End Date Taking? Authorizing Provider  ondansetron (ZOFRAN) 4 MG tablet Take 1 tablet (4 mg total) by mouth every 6 (six) hours as needed for nausea or vomiting. 05/31/21  Yes Madilynne Mullan, Canary Brim, MD  pantoprazole (PROTONIX) 40 MG tablet Take 1 tablet (40 mg total) by mouth daily. 05/31/21  Yes Alix Lahmann, Canary Brim, MD  sucralfate (CARAFATE) 1 GM/10ML suspension Take 10 mLs (1 g total) by mouth 4 (four) times daily -  with meals and at bedtime. 05/31/21  Yes Jyron Turman, Canary Brim, MD  albuterol (PROVENTIL) (2.5 MG/3ML) 0.083% nebulizer solution Take 3 mLs (2.5 mg total) by nebulization every 6 (six) hours as needed for wheezing or shortness of breath. 11/05/20   Nche, Bonna Gains, NP  albuterol (VENTOLIN HFA) 108 (90 Base) MCG/ACT inhaler 1-2 PUFFS EVERY 6 HOURS FOR WHEEZING OR SHORTNESS OF BREATH. NEED OFFICE VISIT FOR ADDITIONAL REFILL 03/22/20   Nche, Bonna Gains, NP  buPROPion (WELLBUTRIN SR) 150 MG 12 hr tablet Take 1 tablet (150 mg total) by mouth 2 (two) times daily. 02/02/21   Nche, Bonna Gains, NP  fluticasone (FLOVENT HFA) 110 MCG/ACT inhaler Inhale 1 puff into the lungs 2 (two) times daily. Rinse mouth after each use 04/15/21   Nche, Bonna Gains, NP  gabapentin (NEURONTIN) 100 MG capsule 1cap at  bedtime x 1week, then 1cap BID x 2weeks, then 1cap in Am and 2cap at bedtime. 05/04/21   Nche, Bonna Gains, NP  hyoscyamine (ANASPAZ) 0.125 MG TBDP disintergrating tablet Place 1 tablet (0.125 mg total) under the tongue every 6 (six) hours as needed for cramping. 04/20/20   Nche, Bonna Gains, NP  medroxyPROGESTERone (DEPO-PROVERA) 150 MG/ML injection Inject 1 mL (150 mg total) into the muscle every 3 (three) months. 11/19/20   [name], MD  naproxen sodium (ANAPROX DS) 550 MG tablet Take 1 tablet (550 mg total) by mouth 2 (two) times daily with a meal. 05/02/21   Burnette, Alessandra Bevels, PA-C  phenazopyridine (PYRIDIUM) 95 MG tablet Take by mouth. 01/19/21   [provider]      Allergies    Patient has no known allergies.    Review of Systems   Review of Systems  Gastrointestinal:  Positive for abdominal pain and vomiting.   Physical Exam Updated Vital Signs BP 111/75    Pulse 81    Temp 98.2 F (36.8 C) (Oral)    Resp 17    Ht 5\' 1"  (1.549 m)    Wt 68.5 kg    LMP 05/28/2021 (Approximate)    SpO2 100%    BMI 28.53 kg/m  Physical Exam Vitals and nursing note reviewed.  Constitutional:      General: She is not in acute distress.    Appearance: Normal appearance. She is well-developed.  HENT:  Head: Normocephalic and atraumatic.     Right Ear: Hearing normal.     Left Ear: Hearing normal.     Nose: Nose normal.  Eyes:     Conjunctiva/sclera: Conjunctivae normal.     Pupils: Pupils are equal, round, and reactive to light.  Cardiovascular:     Rate and Rhythm: Regular rhythm.     Heart sounds: S1 normal and S2 normal. No murmur heard.   No friction rub. No gallop.  Pulmonary:     Effort: Pulmonary effort is normal. No respiratory distress.     Breath sounds: Normal breath sounds.  Chest:     Chest wall: No tenderness.  Abdominal:     General: Bowel sounds are normal.     Palpations: Abdomen is soft.     Tenderness: There is abdominal tenderness in the right  upper quadrant, epigastric area and left upper quadrant. There is no guarding or rebound. Negative signs include Murphy's sign and McBurney's sign.     Hernia: No hernia is present.  Musculoskeletal:        General: Normal range of motion.     Cervical back: Normal range of motion and neck supple.  Skin:    General: Skin is warm and dry.     Findings: No rash.  Neurological:     Mental Status: She is alert and oriented to person, place, and time.     GCS: GCS eye subscore is 4. GCS verbal subscore is 5. GCS motor subscore is 6.     Cranial Nerves: No cranial nerve deficit.     Sensory: No sensory deficit.     Coordination: Coordination normal.  Psychiatric:        Speech: Speech normal.        Behavior: Behavior normal.        Thought Content: Thought content normal.    ED Results / Procedures / Treatments   Labs (all labs ordered are listed, but only abnormal results are displayed) Labs Reviewed  URINALYSIS, ROUTINE W REFLEX MICROSCOPIC - Abnormal; Notable for the following components:      Result Value   Color, Urine COLORLESS (*)    Hgb urine dipstick MODERATE (*)    Bacteria, UA RARE (*)    All other components within normal limits  LIPASE, BLOOD  COMPREHENSIVE METABOLIC PANEL  CBC  PREGNANCY, URINE    EKG None  Radiology No results found.  Procedures Procedures    Medications Ordered in ED Medications  famotidine (PEPCID) IVPB 20 mg premix (20 mg Intravenous New Bag/Given 05/31/21 0404)  sodium chloride 0.9 % bolus 1,000 mL (1,000 mLs Intravenous New Bag/Given 05/31/21 0358)  ondansetron (ZOFRAN) injection 4 mg (4 mg Intravenous Given 05/31/21 0402)  alum & mag hydroxide-simeth (MAALOX/MYLANTA) 200-200-20 MG/5ML suspension 30 mL (30 mLs Oral Given 05/31/21 0359)    And  lidocaine (XYLOCAINE) 2 % viscous mouth solution 15 mL (15 mLs Oral Given 05/31/21 0359)    ED Course/ Medical Decision Making/ A&P                           Medical Decision  Making  Patient presents to the emergency department for evaluation of upper abdominal pain radiating into the back.  Patient did have 1 episode of emesis.  Patient reports a previous history of GERD.  She did have similar symptoms prior to cholecystectomy, but all of her symptoms resolved at that time.  She reports that she  did previously see a GI specialist but since the cholecystectomy has not needed to be seen.  Patient more with mild epigastric tenderness.  No guarding or rebound.  No tenderness below the umbilicus.  No concern for intra pelvic pathology.  No tenderness at McBurney's point.  Lab work is reassuring.  Patient indicates that her pain significantly improved after she had the viscous lidocaine and GI cocktail.  Suspect that this is secondary to reflux and gastritis.  Imaging does not appear to be warranted at this time.  Patient given return precautions but we will aggressively treat for GERD/gastritis, instruct patient to follow-up with her GI specialist.  ocument critical care time when appropriate:1}      Final Clinical Impression(s) / ED Diagnoses Final diagnoses:  Epigastric pain    Rx / DC Orders ED Discharge Orders          Ordered    pantoprazole (PROTONIX) 40 MG tablet  Daily        05/31/21 0426    sucralfate (CARAFATE) 1 GM/10ML suspension  3 times daily with meals & bedtime        05/31/21 0426    ondansetron (ZOFRAN) 4 MG tablet  Every 6 hours PRN        05/31/21 0426              Gilda CreasePollina, Lilyan Prete J, MD 05/31/21 0430

## 2021-05-31 NOTE — Assessment & Plan Note (Addendum)
Unable to afford lyrica (PA declined) Some improvement with gabapentin 1cap in AM and 2caps at HS. Denies any adverse side effects. Maintain daily cardio and strength training exercise routine Has not made any dietary changes Advised to incorporate daily stretching e.g yoga. Increase gabapentin to 100mg  in AM and noon, 200mg  at hs. G/up in 9month

## 2021-06-01 ENCOUNTER — Other Ambulatory Visit (INDEPENDENT_AMBULATORY_CARE_PROVIDER_SITE_OTHER): Payer: 59

## 2021-06-01 ENCOUNTER — Ambulatory Visit (INDEPENDENT_AMBULATORY_CARE_PROVIDER_SITE_OTHER): Payer: 59 | Admitting: Gastroenterology

## 2021-06-01 ENCOUNTER — Encounter: Payer: Self-pay | Admitting: Gastroenterology

## 2021-06-01 VITALS — BP 114/72 | HR 71 | Ht 61.0 in | Wt 162.0 lb

## 2021-06-01 DIAGNOSIS — R1033 Periumbilical pain: Secondary | ICD-10-CM | POA: Diagnosis not present

## 2021-06-01 DIAGNOSIS — R112 Nausea with vomiting, unspecified: Secondary | ICD-10-CM

## 2021-06-01 DIAGNOSIS — A09 Infectious gastroenteritis and colitis, unspecified: Secondary | ICD-10-CM

## 2021-06-01 DIAGNOSIS — R10819 Abdominal tenderness, unspecified site: Secondary | ICD-10-CM

## 2021-06-01 LAB — SEDIMENTATION RATE: Sed Rate: 15 mm/hr (ref 0–20)

## 2021-06-01 LAB — C-REACTIVE PROTEIN: CRP: <1 mg/dL (ref 0.5–20.0)

## 2021-06-01 NOTE — Progress Notes (Signed)
Chief Complaint: FU ED  Referring Provider:  Flossie Buffy, NP      ASSESSMENT AND PLAN;   #1. N/V. Nl CBC, CMP. Neg EGD with bx 06/2020  #2. Periumblical pain  with tenderness  #3. Diarrhea. ?Acute gastroenteritis. Neg colon 06/2020  Plan: -TSH, CRP, ESR, celiac  -Stool studies for GI Pathogen (includes C. Diff) and Calprotectin -CT AP with PO/IV contrast. -She will continue to use Zofran 4 mg ODT on as needed basis. -FU in 12 weeks.    HPI:    Rebecca Rocha is a 21 y.o. female   For ED follow-up  N/V x 4 days of sudden onset, getting worse, seen in ED yesterday was given Zofran with good results.  She previously had negative EGD and colonoscopy as below.  She denies having any heartburn.  Acute on chronic periumbilical/upper abdominal pain which has gotten worse over the last 3 to 4 days.  She describes this as crampy, moderately severe 6-7/10, waxing and waning.  This would occasionally wake her up from sleep.  Lately has gained weight.  Also started having diarrhea x 4 days.  4-5 times per day.  She denies having any nocturnal symptoms.  No melena or hematochezia.  she is s/p cholecystectomy for "acalculous cholecystitis" in July 2022.  Had more or less similar abdominal pain previously.  She was seen in the ED yesterday.  Had normal CBC, CMP.  Negative UA.  He denies eating anywhere unusual or exposure to anyone with gastroenteritis.  No recent antibiotics.  Wt Readings from Last 3 Encounters:  06/01/21 162 lb (73.5 kg)  05/31/21 151 lb (68.5 kg)  05/31/21 151 lb (68.5 kg)   She had multiple ED visits previously for similar reasons.  She did tell me that anxiety would make her symptoms worse.  Past GI work-up:  Colonoscopy 06/2020 (for rectal bleeding) -Minimal internal hemorrhoids. -Otherwise normal colonoscopy to TI. -No specimens collected. -Rpt at age 35.  Earlier, if with any new problems or change in family history  EGD  06/24/2020 -Normal EGD -Negative small bowel biopsies for celiac.  Negative gastric biopsies for HP  CT AP 03/2020 1. No evidence of pulmonary embolism. 2. No acute findings in the chest, abdomen or pelvis. 3. 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  Past Medical History:  Diagnosis Date   Acute calculous cholecystitis 11/26/2019   Anxiety    Asthma    Depression    Menstrual migraine, intractable, without status migrainosus    Panic attack    Sinusitis    Sleep apnea    Syncope and collapse    Vertigo of central origin, unspecified ear     Past Surgical History:  Procedure Laterality Date   CHOLECYSTECTOMY N/A 11/26/2019   Procedure: LAPAROSCOPIC CHOLECYSTECTOMY;  Surgeon: Kinsinger, Arta Bruce, MD;  Location: [facility];  Service: General;  Laterality: N/A;    Family History  Problem Relation Age of Onset   Hypertension Mother    Liver disease Mother    Other Mother        GENETIC (INHERITED) DISORDER   Cancer - Other Mother        breast mass-possible cancer?   Hypertension Father    Diabetes Father    Cancer Paternal Grandmother    Diabetes Paternal Grandmother    Hypertension Paternal Grandmother    High Cholesterol Paternal Grandmother    Cancer Paternal Grandfather  Diabetes Paternal Grandfather    Hypertension Paternal Grandfather    High Cholesterol Paternal Grandfather    Anxiety disorder Sister    Cancer Maternal Aunt    Colon cancer Maternal Uncle    Prostate cancer Paternal Uncle    Esophageal cancer Neg Hx    Rectal cancer Neg Hx    Stomach cancer Neg Hx     Social History   Tobacco Use   Smoking status: Never   Smokeless tobacco: Never  Vaping Use   Vaping Use: Never used  Substance Use Topics   Alcohol use: Yes    Comment: RARE   Drug use: No    Current Outpatient Medications  Medication Sig Dispense Refill   albuterol (PROVENTIL)  (2.5 MG/3ML) 0.083% nebulizer solution Take 3 mLs (2.5 mg total) by nebulization every 6 (six) hours as needed for wheezing or shortness of breath. 100 mL 0   albuterol (VENTOLIN HFA) 108 (90 Base) MCG/ACT inhaler 1-2 PUFFS EVERY 6 HOURS FOR WHEEZING OR SHORTNESS OF BREATH. NEED OFFICE VISIT FOR ADDITIONAL REFILL 6.7 each 1   buPROPion (WELLBUTRIN SR) 150 MG 12 hr tablet Take 1 tablet (150 mg total) by mouth 2 (two) times daily. 180 tablet 3   fluticasone (FLOVENT HFA) 110 MCG/ACT inhaler Inhale 1 puff into the lungs 2 (two) times daily. Rinse mouth after each use 1 each 5   gabapentin (NEURONTIN) 100 MG capsule Take 1cap in AM, 1cap in PM and 2cap at bedtime. 120 capsule 5   hyoscyamine (ANASPAZ) 0.125 MG TBDP disintergrating tablet Place 1 tablet (0.125 mg total) under the tongue every 6 (six) hours as needed for cramping. 90 tablet 2   medroxyPROGESTERone (DEPO-PROVERA) 150 MG/ML injection Inject 1 mL (150 mg total) into the muscle every 3 (three) months. 1 mL 0   ondansetron (ZOFRAN) 4 MG tablet Take 1 tablet (4 mg total) by mouth every 6 (six) hours as needed for nausea or vomiting. 20 tablet 0   pantoprazole (PROTONIX) 40 MG tablet Take 1 tablet (40 mg total) by mouth daily. 30 tablet 3   phenazopyridine (PYRIDIUM) 95 MG tablet Take by mouth.     sucralfate (CARAFATE) 1 GM/10ML suspension Take 10 mLs (1 g total) by mouth 4 (four) times daily -  with meals and at bedtime. 420 mL 0   No current facility-administered medications for this visit.    No Known Allergies  Review of Systems:  Constitutional: Denies fever, chills, diaphoresis, appetite change and fatigue.  HEENT: Denies photophobia, eye pain, redness, hearing loss, ear pain, congestion, sore throat, rhinorrhea, sneezing, mouth sores, neck pain, neck stiffness and tinnitus.   Respiratory: Denies SOB, DOE, cough, chest tightness,  and wheezing.   Cardiovascular: Denies chest pain, palpitations and leg swelling.   Genitourinary: Denies dysuria, urgency, frequency, hematuria, flank pain and difficulty urinating.  Musculoskeletal: Denies myalgias, back pain, joint swelling, arthralgias and gait problem.  Skin: No rash.  Neurological: Denies dizziness, seizures, syncope, weakness, light-headedness, numbness and headaches.  Hematological: Denies adenopathy. Easy bruising, personal or family bleeding history  Psychiatric/Behavioral: Has anxiety, No depression. Has insomnia.     Physical Exam:    BP 114/72 (BP Location: Right Arm, Patient Position: Sitting, Cuff Size: Normal)    Pulse 71    Ht $R'5\' 1"'Du$  (1.549 m)    Wt 162 lb (73.5 kg)    LMP 05/28/2021 (Approximate)    SpO2 97%    BMI 30.61 kg/m  Wt Readings from Last 3 Encounters:  06/01/21 162  lb (73.5 kg)  05/31/21 151 lb (68.5 kg)  05/31/21 151 lb (68.5 kg)   Constitutional:  Well-developed, in no acute distress. Psychiatric: Normal mood and affect. Behavior is normal. HEENT: Pupils normal.  Conjunctivae are normal. No scleral icterus. Cardiovascular: Normal rate, regular rhythm. No edema Pulmonary/chest: Effort normal and breath sounds normal. No wheezing, rales or rhonchi. Abdominal: Soft, nondistended.  Generalized abdominal tenderness more in the lower left lower quadrant without rebound.  Bowel sounds active throughout. There are no masses palpable. No hepatomegaly. Rectal: Deferred Neurological: Alert and oriented to person place and time. Skin: Skin is warm and dry. No rashes noted.  Data Reviewed: I have personally reviewed following labs and imaging studies  CBC: CBC Latest Ref Rng & Units 05/31/2021 09/06/2020 07/19/2020  WBC 4.0 - 10.5 K/uL 9.9 8.4 10.1  Hemoglobin 12.0 - 15.0 g/dL 13.2 12.7 12.2  Hematocrit 36.0 - 46.0 % 41.2 41.2 38.9  Platelets 150 - 400 K/uL 399 407(H) 390    CMP: CMP Latest Ref Rng & Units 05/31/2021 01/12/2021 09/06/2020  Glucose 70 - 99 mg/dL 92 88 87  BUN 6 - 20 mg/dL $Remove'15 14 10  'iHKHwTP$ Creatinine 0.44 - 1.00 mg/dL  0.72 0.79 0.68  Sodium 135 - 145 mmol/L 136 136 136  Potassium 3.5 - 5.1 mmol/L 3.7 4.1 3.6  Chloride 98 - 111 mmol/L 103 105 103  CO2 22 - 32 mmol/L $RemoveB'23 23 29  'jkUeZbVq$ Calcium 8.9 - 10.3 mg/dL 9.1 9.5 9.5  Total Protein 6.5 - 8.1 g/dL 7.7 7.6 7.8  Total Bilirubin 0.3 - 1.2 mg/dL 0.5 0.4 0.5  Alkaline Phos 38 - 126 U/L 61 68 63  AST 15 - 41 U/L $Remo'16 14 19  'XUpmc$ ALT 0 - 44 U/L $Remo'16 14 18    'aiVnr$ GFR: Estimated Creatinine Clearance: 101.2 mL/min (by C-G formula based on SCr of 0.72 mg/dL). Liver Function Tests: Recent Labs  Lab 05/31/21 0258  AST 16  ALT 16  ALKPHOS 61  BILITOT 0.5  PROT 7.7  ALBUMIN 4.2   Recent Labs  Lab 05/31/21 0258  LIPASE 16       Carmell Austria, MD 06/01/2021, 9:31 AM  Cc: Flossie Buffy, NP

## 2021-06-01 NOTE — Patient Instructions (Signed)
If you are age 23 or older, your body mass index should be between 23-30. Your Body mass index is 30.61 kg/m. If this is out of the aforementioned range listed, please consider follow up with your Primary Care Provider.  If you are age 31 or younger, your body mass index should be between 19-25. Your Body mass index is 30.61 kg/m. If this is out of the aformentioned range listed, please consider follow up with your Primary Care Provider.   ________________________________________________________  The [facility] providers would like to encourage you to use [facility] to communicate with providers for non-urgent requests or questions.  Due to long hold times on the telephone, sending your provider a message by [facility] may be a faster and more efficient way to get a response.  Please allow 48 business hours for a response.  Please remember that this is for non-urgent requests.  _______________________________________________________  Please go to the lab on the 2nd floor [address] before you leave the office today.   Please call in 12 weeks to make a follow up appointment.  Please call in 3 business days to schedule CT. This need to be done by 06-28-2021 You have been scheduled for a CT [facility] the abdomen [facility] [place] [place] [address] [address]   You are scheduled on          at             . You should arrive 15 minutes prior to your appointment time for registration. Please follow the written instructions below on the day of your exam:  WARNING: IF YOU ARE ALLERGIC TO IODINE/X-RAY DYE, PLEASE NOTIFY RADIOLOGY IMMEDIATELY AT [phone]! YOU WILL BE GIVEN A 13 HOUR PREMEDICATION PREP.  1) Do not eat or drink anything after         (4 hours prior to your test) 2) You have been given 2 bottles of oral contrast to drink. The solution may taste better if refrigerated, but do NOT add ice or any other liquid to this solution. Shake well  before drinking.    Drink 1 bottle of contrast @          (2 hours prior to your exam)  Drink 1 bottle of contrast @          (1 hour prior to your exam)  You may take any medications as prescribed with a small amount of water, if necessary. If you take any of the following medications: METFORMIN, GLUCOPHAGE, GLUCOVANCE, AVANDAMET, RIOMET, FORTAMET, [place] MET, JANUMET, GLUMETZA or METAGLIP, you MAY be asked to HOLD this medication 48 hours AFTER the exam.  The purpose of you drinking the oral contrast is to aid in the visualization of your intestinal tract. The contrast solution may cause some diarrhea. Depending on your individual set of symptoms, you may also receive an intravenous injection of x-ray contrast/dye. Plan on being at [facility] for 30 minutes or longer, depending on the type of exam you are having performed.  This test typically takes 30-45 minutes to complete.  If you have any questions regarding your exam or if you need to reschedule, you may call the CT department at [phone] between the hours of 8:00 am and 5:00 pm, Monday-Friday.  ________________________________________________________________________  Please call with any questions or concerns.  Thank you,  Dr. Jackquline Denmark

## 2021-06-02 ENCOUNTER — Other Ambulatory Visit: Payer: 59

## 2021-06-02 DIAGNOSIS — A09 Infectious gastroenteritis and colitis, unspecified: Secondary | ICD-10-CM

## 2021-06-02 DIAGNOSIS — R10819 Abdominal tenderness, unspecified site: Secondary | ICD-10-CM

## 2021-06-02 DIAGNOSIS — R1033 Periumbilical pain: Secondary | ICD-10-CM

## 2021-06-02 DIAGNOSIS — R112 Nausea with vomiting, unspecified: Secondary | ICD-10-CM

## 2021-06-03 ENCOUNTER — Telehealth: Payer: Self-pay | Admitting: Gastroenterology

## 2021-06-03 LAB — CELIAC PANEL 10
Antigliadin Abs, IgA: 6 units (ref 0–19)
Endomysial IgA: NEGATIVE
Gliadin IgG: 10 units (ref 0–19)
IgA/Immunoglobulin A, Serum: 242 mg/dL (ref 87–352)
Tissue Transglut Ab: 4 U/mL (ref 0–5)
Transglutaminase IgA: <2 U/mL (ref 0–3)

## 2021-06-03 LAB — CLOSTRIDIUM DIFFICILE TOXIN B, QUALITATIVE, REAL-TIME PCR: Toxigenic C. Difficile by PCR: NOT DETECTED

## 2021-06-03 NOTE — Telephone Encounter (Signed)
Patient called wanting to see if there is anyway she can get a pain medication proscribed and a note for work. Seeking advice, please advise.

## 2021-06-03 NOTE — Telephone Encounter (Signed)
Pt states that's she was recently seen in the ER on the 17th of January and had an Office Visit with Dr. Chales Abrahams on the 18 of January. Pt states that she is not getting any better. Pt state that she is still having abdominal pain along with N/V. Pt states he last BM was diarrhea yesterday morning. Pt requesting medication along with a note for work.  Please advise

## 2021-06-05 LAB — GI PROFILE, STOOL, PCR

## 2021-06-05 LAB — CALPROTECTIN, FECAL: Calprotectin, Fecal: <16 ug/g (ref 0–120)

## 2021-06-07 ENCOUNTER — Encounter: Payer: Self-pay | Admitting: Gastroenterology

## 2021-06-07 NOTE — Telephone Encounter (Signed)
I talked to patient over the phone this morning She is not having any diarrhea.  Her stool for C. difficile toxin was positive, but PCR was negative.  Fecal calprotectin was normal as well.  Hold off on treatment for C. Difficile  Still having N/V Scheduled for CT on 1/26  Also wants note off work since last visit-please go ahead and do it Remo Lipps, can remove her CT for today or tomorrow? RG

## 2021-06-07 NOTE — Telephone Encounter (Signed)
Work note was created and sent to pt via My Chart. Pt made aware Ct scan rescheduled for 06/08/2021 at [facility] at 10:00: Pt to arrive at 9:30 No thing to eat or drink 4 hours prior. Start drinking first Bottle of oral contrast at 8AM  and start second bottle at 9:00 Pt made aware Pt verbalized understanding with all questions answered.

## 2021-06-08 ENCOUNTER — Ambulatory Visit (HOSPITAL_COMMUNITY)
Admission: RE | Admit: 2021-06-08 | Discharge: 2021-06-08 | Disposition: A | Discharge status: Home / Self Care | Payer: 59 | Source: Ambulatory Visit | Attending: Gastroenterology | Admitting: Gastroenterology

## 2021-06-08 ENCOUNTER — Encounter (HOSPITAL_COMMUNITY): Payer: Self-pay

## 2021-06-08 ENCOUNTER — Other Ambulatory Visit: Payer: Self-pay

## 2021-06-08 DIAGNOSIS — R1033 Periumbilical pain: Secondary | ICD-10-CM | POA: Diagnosis present

## 2021-06-08 DIAGNOSIS — R10819 Abdominal tenderness, unspecified site: Secondary | ICD-10-CM | POA: Diagnosis present

## 2021-06-08 DIAGNOSIS — R112 Nausea with vomiting, unspecified: Secondary | ICD-10-CM | POA: Diagnosis present

## 2021-06-08 DIAGNOSIS — A09 Infectious gastroenteritis and colitis, unspecified: Secondary | ICD-10-CM | POA: Insufficient documentation

## 2021-06-08 MED ORDER — IOHEXOL 300 MG/ML  SOLN
100.0000 mL | Freq: Once | INTRAMUSCULAR | Status: AC | PRN
  Administered 2021-06-08: 11:00:00 100 mL via INTRAVENOUS

## 2021-06-09 ENCOUNTER — Other Ambulatory Visit (HOSPITAL_BASED_OUTPATIENT_CLINIC_OR_DEPARTMENT_OTHER): Payer: 59

## 2021-06-10 ENCOUNTER — Telehealth: Payer: Self-pay | Admitting: Nurse Practitioner

## 2021-06-10 NOTE — Telephone Encounter (Signed)
[facility] is wondering if we could draw pt's lab TSH the next time she is scheduled to come into our office.  Which is 06/01/21. Lerry Paterson [phone].

## 2021-06-14 NOTE — Telephone Encounter (Signed)
Patient notified that labs could be drawn and verbalized understanding

## 2021-06-17 ENCOUNTER — Telehealth: Payer: Self-pay | Admitting: Gastroenterology

## 2021-06-17 NOTE — Telephone Encounter (Signed)
Hey Dr Chales Abrahams, this pt left a form for you to review that she got diagnosed with from 2nd MD virtual education consultation program along with treatment plans and imaging studies to be ordered, pt would like for you to review them and advise what she needs to do please, thank you!

## 2021-06-17 NOTE — Telephone Encounter (Signed)
Cannot find it anywhere-in Care Everywhere or otherwise If you do, please let me know RG

## 2021-06-20 NOTE — Telephone Encounter (Signed)
Patient called to follow up on request below. Was Dr. Lyndel Safe given the paperwork from the patient to review?

## 2021-06-21 NOTE — Telephone Encounter (Signed)
Left message for pt to call back  °

## 2021-06-21 NOTE — Telephone Encounter (Signed)
Pt advised of Dr. Chales Abrahams previous note: Pt stated that she understands: Pt scheduled for an office Visit with Doug Sou on 07/07/2021 at 9:00. Pt notified that this is in the  office: Pt stated that she knew the address:  Pt made aware: Pt verbalized understanding with all questions answered.

## 2021-06-21 NOTE — Telephone Encounter (Signed)
Viviann Spare, Do not have any 2nd opinion or televist Notes in chart or on my desk Ethically, we cannot order tests of someone else's notes. Has to be done by the other physician RG

## 2021-06-28 ENCOUNTER — Ambulatory Visit
Admission: RE | Admit: 2021-06-28 | Discharge: 2021-06-28 | Disposition: A | Discharge status: Home / Self Care | Payer: 59 | Source: Ambulatory Visit | Attending: Nurse Practitioner | Admitting: Nurse Practitioner

## 2021-06-28 DIAGNOSIS — Z09 Encounter for follow-up examination after completed treatment for conditions other than malignant neoplasm: Secondary | ICD-10-CM

## 2021-07-07 ENCOUNTER — Ambulatory Visit (INDEPENDENT_AMBULATORY_CARE_PROVIDER_SITE_OTHER): Payer: 59 | Admitting: Nurse Practitioner

## 2021-07-07 ENCOUNTER — Ambulatory Visit (INDEPENDENT_AMBULATORY_CARE_PROVIDER_SITE_OTHER): Payer: 59 | Admitting: Gastroenterology

## 2021-07-07 ENCOUNTER — Encounter: Payer: Self-pay | Admitting: Nurse Practitioner

## 2021-07-07 ENCOUNTER — Other Ambulatory Visit: Payer: 59

## 2021-07-07 ENCOUNTER — Other Ambulatory Visit: Payer: Self-pay

## 2021-07-07 ENCOUNTER — Encounter: Payer: Self-pay | Admitting: Gastroenterology

## 2021-07-07 ENCOUNTER — Other Ambulatory Visit (INDEPENDENT_AMBULATORY_CARE_PROVIDER_SITE_OTHER): Payer: 59

## 2021-07-07 VITALS — BP 108/62 | HR 64 | Ht 61.0 in | Wt 155.4 lb

## 2021-07-07 VITALS — BP 110/78 | HR 87 | Temp 98.5°F | Ht 60.25 in | Wt 154.6 lb

## 2021-07-07 DIAGNOSIS — R1084 Generalized abdominal pain: Secondary | ICD-10-CM | POA: Diagnosis not present

## 2021-07-07 DIAGNOSIS — R10819 Abdominal tenderness, unspecified site: Secondary | ICD-10-CM

## 2021-07-07 DIAGNOSIS — R110 Nausea: Secondary | ICD-10-CM

## 2021-07-07 DIAGNOSIS — Z0000 Encounter for general adult medical examination without abnormal findings: Secondary | ICD-10-CM | POA: Diagnosis not present

## 2021-07-07 DIAGNOSIS — R112 Nausea with vomiting, unspecified: Secondary | ICD-10-CM

## 2021-07-07 DIAGNOSIS — F3342 Major depressive disorder, recurrent, in full remission: Secondary | ICD-10-CM

## 2021-07-07 DIAGNOSIS — J4520 Mild intermittent asthma, uncomplicated: Secondary | ICD-10-CM

## 2021-07-07 DIAGNOSIS — Z23 Encounter for immunization: Secondary | ICD-10-CM

## 2021-07-07 DIAGNOSIS — R1033 Periumbilical pain: Secondary | ICD-10-CM

## 2021-07-07 DIAGNOSIS — A09 Infectious gastroenteritis and colitis, unspecified: Secondary | ICD-10-CM

## 2021-07-07 DIAGNOSIS — Z8481 Family history of carrier of genetic disease: Secondary | ICD-10-CM | POA: Diagnosis not present

## 2021-07-07 LAB — TSH: TSH: 0.42 u[IU]/mL (ref 0.35–5.50)

## 2021-07-07 MED ORDER — FLUTICASONE PROPIONATE HFA 110 MCG/ACT IN AERO: 1.0000 | INHALATION_SPRAY | Freq: Two times a day (BID) | RESPIRATORY_TRACT | 5 refills | Status: DC

## 2021-07-07 MED ORDER — BUPROPION HCL ER (SR) 150 MG PO TB12: 150.0000 mg | ORAL_TABLET | Freq: Two times a day (BID) | ORAL | 3 refills | Status: DC

## 2021-07-07 MED ORDER — DICYCLOMINE HCL 20 MG PO TABS: 20.0000 mg | ORAL_TABLET | Freq: Two times a day (BID) | ORAL | 2 refills | Status: DC

## 2021-07-07 MED ORDER — DICYCLOMINE HCL 10 MG PO CAPS: 10.0000 mg | ORAL_CAPSULE | Freq: Two times a day (BID) | ORAL | 2 refills | Status: DC

## 2021-07-07 MED ORDER — ALBUTEROL SULFATE HFA 108 (90 BASE) MCG/ACT IN AERS: INHALATION_SPRAY | RESPIRATORY_TRACT | 2 refills | Status: DC

## 2021-07-07 NOTE — Patient Instructions (Addendum)
Continue heart healthy diet and regular exercise Start calcium $RemoveBefor'800mg'[id number]$  and vitamin D 1000IU daily for bone health.  Preventive Care 93-23 Years Old, Female Preventive care refers to lifestyle choices and visits with your health care provider that can promote health and wellness. Preventive care visits are also called wellness exams. What can I expect for my preventive care visit? Counseling During your preventive care visit, your health care provider may ask about your: Medical history, including: Past medical problems. Family medical history. Pregnancy history. Current health, including: Menstrual cycle. Method of birth control. Emotional well-being. Home life and relationship well-being. Sexual activity and sexual health. Lifestyle, including: Alcohol, nicotine or tobacco, and drug use. Access to firearms. Diet, exercise, and sleep habits. Work and work Statistician. Sunscreen use. Safety issues such as seatbelt and bike helmet use. Physical exam Your health care provider may check your: Height and weight. These may be used to calculate your BMI (body mass index). BMI is a measurement that tells if you are at a healthy weight. Waist circumference. This measures the distance around your waistline. This measurement also tells if you are at a healthy weight and may help predict your risk of certain diseases, such as type 2 diabetes and high blood pressure. Heart rate and blood pressure. Body temperature. Skin for abnormal spots. What immunizations do I need? Vaccines are usually given at various ages, according to a schedule. Your health care provider will recommend vaccines for you based on your age, medical history, and lifestyle or other factors, such as travel or where you work. What tests do I need? Screening Your health care provider may recommend screening tests for certain conditions. This may include: Pelvic exam and Pap test. Lipid and cholesterol levels. Diabetes  screening. This is done by checking your blood sugar (glucose) after you have not eaten for a while (fasting). Hepatitis B test. Hepatitis C test. HIV (human immunodeficiency virus) test. STI (sexually transmitted infection) testing, if you are at risk. BRCA-related cancer screening. This may be done if you have a family history of breast, ovarian, tubal, or peritoneal cancers. Talk with your health care provider about your test results, treatment options, and if necessary, the need for more tests. Follow these instructions at home: Eating and drinking  Eat a healthy diet that includes fresh fruits and vegetables, whole grains, lean protein, and low-fat dairy products. Take vitamin and mineral supplements as recommended by your health care provider. Do not drink alcohol if: Your health care provider tells you not to drink. You are pregnant, may be pregnant, or are planning to become pregnant. If you drink alcohol: Limit how much you have to 0-1 drink a day. Know how much alcohol is in your drink. In the [place] one drink equals one 12 oz bottle of beer (355 mL), one 5 oz glass of wine (148 mL), or one 1 oz glass of hard liquor (44 mL). Lifestyle Brush your teeth every morning and night with fluoride toothpaste. Floss one time each day. Exercise for at least 30 minutes 5 or more days each week. Do not use any products that contain nicotine or tobacco. These products include cigarettes, chewing tobacco, and vaping devices, such as e-cigarettes. If you need help quitting, ask your health care provider. Do not use drugs. If you are sexually active, practice safe sex. Use a condom or other form of protection to prevent STIs. If you do not wish to become pregnant, use a form of birth control. If you plan to become pregnant,  see your health care provider for a prepregnancy visit. Find healthy ways to manage stress, such as: Meditation, yoga, or listening to music. Journaling. Talking to a trusted  person. Spending time with friends and family. Minimize exposure to UV radiation to reduce your risk of skin cancer. Safety Always wear your seat belt while driving or riding in a vehicle. Do not drive: If you have been drinking alcohol. Do not ride with someone who has been drinking. If you have been using any mind-altering substances or drugs. While texting. When you are tired or distracted. Wear a helmet and other protective equipment during sports activities. If you have firearms in your house, make sure you follow all gun safety procedures. Seek help if you have been physically or sexually abused. What's next? Go to your health care provider once a year for an annual wellness visit. Ask your health care provider how often you should have your eyes and teeth checked. Stay up to date on all vaccines. This information is not intended to replace advice given to you by your health care provider. Make sure you discuss any questions you have with your health care provider. Document Revised: 10/27/2020 Document Reviewed: 10/27/2020 Elsevier Patient Education  [place].

## 2021-07-07 NOTE — Assessment & Plan Note (Signed)
Stable mood with medication and psychotherapy Med refill sent

## 2021-07-07 NOTE — Patient Instructions (Addendum)
If you are age 23 or younger, your body mass index should be between 19-25. Your Body mass index is 29.36 kg/m. If this is out of the aformentioned range listed, please consider follow up with your Primary Care Provider.  ________________________________________________________  The [facility] providers would like to encourage you to use [facility] to communicate with providers for non-urgent requests or questions.  Due to long hold times on the telephone, sending your provider a message by [facility] may be a faster and more efficient way to get a response.  Please allow 48 business hours for a response.  Please remember that this is for non-urgent requests.  _______________________________________________________  Your provider has requested that you go to the basement level for lab work before leaving today. Press "B" on the elevator. The lab is located at the first door on the left as you exit the elevator.  START Dicyclomine 20 mg 1 tablet twice daily.  Follow up pending your lab results.  Follow up with Dr. Lyndel Safe as needed for now.  Thank you for entrusting me with your care and choosing [facility].  Alonza Bogus, PA-C

## 2021-07-07 NOTE — Progress Notes (Signed)
Subjective:    Patient ID: Rebecca Rocha, female    DOB: 15-Feb-2001, 21 y.o.   MRN: [id number]  Patient presents today for CPE   HPI We reviewed CBC and CMP completed 14month ago (normal)  Asthma Stable, no acute exacerbation Current use of flovent daily and albuterol prn  maintain med doses Refills sent  Depression, recurrent (HCC) Stable mood with medication and psychotherapy Med refill sent  Vision:up to date Dental:up to date Diet:heart  healthy diet Exercise: works with Systems analyst Weight:  Wt Readings from Last 3 Encounters:  07/07/21 154 lb 9.6 oz (70.1 kg)  07/07/21 155 lb 6.4 oz (70.5 kg)  06/01/21 162 lb (73.5 kg)   Sexual History (orientation,birth control, marital status, STD):no STD screen needed, up to date with pelvic and breast exam Due to Fhx of Breast cancer, she agreed to genetic counselor referral.  Depression/Suicide: Depression screen [facility] 2/9 07/07/2021 05/31/2021 04/15/2021 02/02/2021 12/17/2020 10/08/2020 08/20/2020  Decreased Interest 1 3 0 3 3 1 3   Down, Depressed, Hopeless 2 1 0 3 3 1 3   PHQ - 2 Score 3 4 0 6 6 2 6   Altered sleeping 1 3 - 3 3 3 3   Tired, decreased energy 1 3 - 3 3 1 3   Change in appetite 2 3 - 3 3 0 3  Feeling bad or failure about yourself  0 0 - 2 3 1 3   Trouble concentrating 0 3 - 3 3 0 3  Moving slowly or fidgety/restless 0 0 - 1 3 0 3  Suicidal thoughts 0 0 - 0 1 0 2  PHQ-9 Score 7 16 - 21 25 7 26   Difficult doing work/chores Somewhat difficult Very difficult - Extremely dIfficult Extremely dIfficult Somewhat difficult Extremely dIfficult   GAD 7 : Generalized Anxiety Score 07/07/2021 05/31/2021 02/02/2021 12/17/2020  Nervous, Anxious, on Edge 1 3 3 3   Control/stop worrying 1 2 3 3   Worry too much - different things 1 3 3 3   Trouble relaxing 1 3 3 3   Restless 0 0 2 3  Easily annoyed or irritable 1 3 3 3   Afraid - awful might happen 1 0 2 3  Total GAD 7 Score 6 14 19 21   Anxiety Difficulty Somewhat difficult  Extremely difficult Extremely difficult Extremely difficult   Immunizations: (TDAP, Hep C screen, Pneumovax, Influenza, zoster)  Health Maintenance  Topic Date Due   HPV Vaccine (1 - 2-dose series) Never done   Chlamydia screening  07/07/2022*   Pap Smear  10/15/2023   Pap Smear  10/15/2023   Tetanus Vaccine  06/16/2029   Flu Shot  Completed   Hepatitis C Screening: USPSTF Recommendation to screen - Ages 18-79 yo.  Completed   HIV Screening  Completed   COVID-19 Vaccine  Discontinued  *Topic was postponed. The date shown is not the original due date.   Fall Risk: Fall Risk  04/15/2021 09/18/2019  Falls in the past year? 0 1  Number falls in past yr: 0 0  Injury with Fall? - 0   Medications and allergies reviewed with patient and updated if appropriate.  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 100 mL 0   etonogestrel (NEXPLANON) 68 MG IMPL implant 1 each by Subdermal route once.     gabapentin (NEURONTIN) 100 MG capsule Take 1cap in AM, 1cap in PM and 2cap at bedtime. 120 capsule 5   hyoscyamine (ANASPAZ) 0.125 MG TBDP disintergrating tablet Place 1 tablet  (0.125 mg total) under the tongue every 6 (six) hours as needed for cramping. 90 tablet 2   ondansetron (ZOFRAN) 4 MG tablet Take 1 tablet (4 mg total) by mouth every 6 (six) hours as needed for nausea or vomiting. 20 tablet 0   pantoprazole (PROTONIX) 40 MG tablet Take 1 tablet (40 mg total) by mouth daily. 30 tablet 3   phenazopyridine (PYRIDIUM) 95 MG tablet Take by mouth.     No current facility-administered medications on file prior to visit.    Past Medical History:  Diagnosis Date   Acute calculous cholecystitis 11/26/2019   Allergy    Anxiety    Asthma    Depression    Menstrual migraine, intractable, without status migrainosus    Panic attack    Sinusitis    Sleep apnea    Syncope and collapse    Vertigo of central origin, unspecified ear     Past Surgical History:  Procedure Laterality Date   CHOLECYSTECTOMY N/A 11/26/2019   Procedure: LAPAROSCOPIC CHOLECYSTECTOMY;  Surgeon: Kinsinger, De Blanch, MD;  Location: [facility];  Service: General;  Laterality: N/A;    Social History   Socioeconomic History   Marital status: Married    Spouse name: Not on file   Number of children: 0   Years of education: Not on file   Highest education level: Not on file  Occupational History   Not on file  Tobacco Use   Smoking status: Never   Smokeless tobacco: Never  Vaping Use   Vaping Use: Never used  Substance and Sexual Activity   Alcohol use: Yes    Comment: RARE   Drug use: No   Sexual activity: Yes    Partners: Male    Birth control/protection: Implant, Injection    Comment: 1st intercourse- 15, partners- 5, current partnER- 4 YRS  Other Topics Concern   Not on file  Social History Narrative   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   Anxiety disorder Mother    Kidney disease Mother    Hypertension Father    Diabetes Father    Anxiety disorder Father    Cancer Paternal Grandmother    Diabetes Paternal Grandmother  Hypertension Paternal Grandmother    High Cholesterol Paternal Grandmother    Cancer Paternal Grandfather    Diabetes Paternal Grandfather    Hypertension Paternal Grandfather    High Cholesterol Paternal Grandfather    Anxiety disorder Sister    Depression Sister    Cancer Maternal Aunt    Asthma Maternal Aunt    Colon cancer Maternal Uncle    Prostate cancer Paternal Uncle    Esophageal cancer Neg Hx    Rectal cancer Neg Hx    Stomach cancer Neg Hx         Review of Systems  Constitutional:  Negative for fever, malaise/fatigue and weight loss.  HENT:  Negative for congestion and sore throat.   Eyes:        Negative for visual changes  Respiratory:  Negative for cough and shortness of breath.   Cardiovascular:  Negative for chest pain, palpitations and leg swelling.  Gastrointestinal:  Negative for blood in stool, constipation, diarrhea and heartburn.  Genitourinary:  Negative for dysuria, frequency and urgency.  Musculoskeletal:  Negative for falls, joint pain and myalgias.  Skin:  Negative for rash.  Neurological:  Negative for dizziness, sensory change and headaches.  Endo/Heme/Allergies:  Does not bruise/bleed easily.  Psychiatric/Behavioral:  Negative for depression, substance abuse and suicidal ideas. The patient is not nervous/anxious and does not have insomnia.    Objective:   Vitals:   07/07/21 1325  BP: 110/78  Pulse: 87  Temp: 98.5 F (36.9 C)  SpO2: 98%    Body mass index is 29.94 kg/m.   Physical Examination:  Physical Exam Constitutional:      General: She is not in acute  distress. HENT:     Right Ear: Tympanic membrane, ear canal and external ear normal.     Left Ear: Tympanic membrane, ear canal and external ear normal.  Eyes:     General: No scleral icterus.    Extraocular Movements: Extraocular movements intact.     Conjunctiva/sclera: Conjunctivae normal.  Neck:     Thyroid: No thyromegaly.  Cardiovascular:     Rate and Rhythm: Normal rate and regular rhythm.     Pulses: Normal pulses.     Heart sounds: Normal heart sounds.  Pulmonary:     Effort: Pulmonary effort is normal.     Breath sounds: Normal breath sounds.  Chest:     Chest wall: No tenderness.  Abdominal:     General: Bowel sounds are normal. There is no distension.     Palpations: Abdomen is soft.     Tenderness: There is no abdominal tenderness.  Genitourinary:    Comments: Deferred breast and pelvic to GYN Musculoskeletal:        General: No tenderness. Normal range of motion.     Cervical back: Normal range of motion and neck supple.     Right lower leg: No edema.     Left lower leg: No edema.  Lymphadenopathy:     Cervical: No cervical adenopathy.  Skin:    General: Skin is warm and dry.  Neurological:     Mental Status: She is alert and oriented to person, place, and time.  Psychiatric:        Mood and Affect: Mood normal.        Behavior: Behavior normal.        Thought Content: Thought content normal.   ASSESSMENT and PLAN: This visit occurred during the SARS-CoV-2 public health emergency.  Safety protocols were in place, including  screening questions prior to the visit, additional usage of staff PPE, and extensive cleaning of exam room while observing appropriate contact time as indicated for disinfecting solutions.   Panhia was seen today for annual exam.  Diagnoses and all orders for this visit:  Preventative health care  Mild intermittent asthma without complication -     fluticasone (FLOVENT HFA) 110 MCG/ACT inhaler; Inhale 1 puff into the lungs 2 (two)  times daily. Rinse mouth after each use -     albuterol (VENTOLIN HFA) 108 (90 Base) MCG/ACT inhaler; 1-2 PUFFS EVERY 6 HOURS FOR WHEEZING OR SHORTNESS OF BREATH.  Family history of breast cancer gene mutation in first degree relative -     Ambulatory referral to Genetics  Recurrent major depressive disorder, in full remission (HCC) -     buPROPion (WELLBUTRIN SR) 150 MG 12 hr tablet; Take 1 tablet (150 mg total) by mouth 2 (two) times daily.      Problem List Items Addressed This Visit       Respiratory   Asthma    Stable, no acute exacerbation Current use of flovent daily and albuterol prn  maintain med doses Refills sent      Relevant Medications   fluticasone (FLOVENT HFA) 110 MCG/ACT inhaler   albuterol (VENTOLIN HFA) 108 (90 Base) MCG/ACT inhaler     Other   Depression, recurrent (HCC)    Stable mood with medication and psychotherapy Med refill sent      Relevant Medications   buPROPion (WELLBUTRIN SR) 150 MG 12 hr tablet   Other Visit Diagnoses     Preventative health care    -  Primary   Family history of breast cancer gene mutation in first degree relative       Relevant Orders   Ambulatory referral to Genetics       Follow up: Return in about 1 year (around 07/07/2022) for CPE (fasting).  Alysia Penna, NP

## 2021-07-07 NOTE — Assessment & Plan Note (Signed)
Stable, no acute exacerbation Current use of flovent daily and albuterol prn  maintain med doses Refills sent

## 2021-07-07 NOTE — Progress Notes (Signed)
07/07/2021 Rebecca Rocha [id number] 2000-08-11   HISTORY OF PRESENT ILLNESS: This is a 21 year old female who is a patient of Dr. Steve Rattler.  She is here for follow-up of her GI symptoms.  She continues to complain of frequent nausea.  Says she has nausea even when she smells certain things.  Nausea is an every day thing.  She has occasional vomiting.  She continues to complain of generalized abdominal pain.  Is having diarrhea, she says about twice a day.  No solid stools recently.  She had a positive C. difficile toxin on her GI pathogen panel, but the PCR was negative.  She was not treated because her fecal calprotectin was normal and reports said that she was not having diarrhea (although that is why it was ordered to begin with).  She tells me that she had an appointment with her insurance company and they recommended that if she continued to have pain then to ask if we can order an MRI to look for stones in her bile duct.  She continues on pantoprazole 40 mg daily.  She is using Carafate suspension 4 times a day.  Has Zofran which she takes, but says that it does not really help.  She has hyoscyamine that she uses as needed and does feel like it helps some.  Denies marijuana use.  She does report lactose intolerance.  Celiac labs negative.  Inflammatory markers negative.  Once again fecal calprotectin was normal.  She had a CT scan of the abdomen and pelvis that was unremarkable.  No biliary ductal dilatation.  LFTs normal.  Colonoscopy 06/2020:  06/2020 showed non-bleeding internal hemorrhoids.  EGD 06/2020 was normal.  1. Surgical [P], duodenal biopsies - DUODENAL MUCOSA WITH NO SIGNIFICANT PATHOLOGIC FINDINGS. - NEGATIVE FOR INCREASED INTRAEPITHELIAL LYMPHOCYTES AND VILLOUS ARCHITECTURAL CHANGES. 2. Surgical [P], gastric antrum and gastric body - MILD CHRONIC GASTRITIS. - WARTHIN-STARRY STAIN IS NEGATIVE FOR HELICOBACTER PYLORI.  Past Medical History:  Diagnosis Date    Acute calculous cholecystitis 11/26/2019   Anxiety    Asthma    Depression    Menstrual migraine, intractable, without status migrainosus    Panic attack    Sinusitis    Sleep apnea    Syncope and collapse    Vertigo of central origin, unspecified ear    Past Surgical History:  Procedure Laterality Date   CHOLECYSTECTOMY N/A 11/26/2019   Procedure: LAPAROSCOPIC CHOLECYSTECTOMY;  Surgeon: Kinsinger, Arta Bruce, MD;  Location: [facility];  Service: General;  Laterality: N/A;    reports that she has never smoked. She has never used smokeless tobacco. She reports current alcohol use. She reports that she does not use drugs. family history includes Anxiety disorder in her sister; Cancer in her maternal aunt, paternal grandfather, and paternal grandmother; Cancer - Other in her mother; Colon cancer in her maternal uncle; Diabetes in her father, paternal grandfather, and paternal grandmother; High Cholesterol in her paternal grandfather and paternal grandmother; Hypertension in her father, mother, paternal grandfather, and paternal grandmother; Liver disease in her mother; Other in her mother; Prostate cancer in her paternal uncle. No Known Allergies    Outpatient Encounter Medications as of 07/07/2021  Medication Sig   albuterol (PROVENTIL) (2.5 MG/3ML) 0.083% nebulizer solution Take 3 mLs (2.5 mg total) by nebulization every 6 (six) hours as needed for wheezing or shortness of breath.   albuterol (VENTOLIN HFA) 108 (90 Base) MCG/ACT inhaler 1-2 PUFFS EVERY 6 HOURS FOR WHEEZING OR SHORTNESS OF BREATH. NEED OFFICE VISIT  FOR ADDITIONAL REFILL   buPROPion (WELLBUTRIN SR) 150 MG 12 hr tablet Take 1 tablet (150 mg total) by mouth 2 (two) times daily.   dicyclomine (BENTYL) 20 MG tablet Take 1 tablet (20 mg total) by mouth 2 (two) times daily.   fluticasone (FLOVENT HFA) 110 MCG/ACT inhaler Inhale 1 puff into the lungs 2 (two) times daily. Rinse mouth after each use   gabapentin (NEURONTIN) 100 MG capsule  Take 1cap in AM, 1cap in PM and 2cap at bedtime.   hyoscyamine (ANASPAZ) 0.125 MG TBDP disintergrating tablet Place 1 tablet (0.125 mg total) under the tongue every 6 (six) hours as needed for cramping.   medroxyPROGESTERone (DEPO-PROVERA) 150 MG/ML injection Inject 1 mL (150 mg total) into the muscle every 3 (three) months.   ondansetron (ZOFRAN) 4 MG tablet Take 1 tablet (4 mg total) by mouth every 6 (six) hours as needed for nausea or vomiting.   pantoprazole (PROTONIX) 40 MG tablet Take 1 tablet (40 mg total) by mouth daily.   phenazopyridine (PYRIDIUM) 95 MG tablet Take by mouth.   sucralfate (CARAFATE) 1 GM/10ML suspension Take 10 mLs (1 g total) by mouth 4 (four) times daily -  with meals and at bedtime.   [DISCONTINUED] dicyclomine (BENTYL) 10 MG capsule Take 1 capsule (10 mg total) by mouth in the morning and at bedtime.   No facility-administered encounter medications on file as of 07/07/2021.     REVIEW OF SYSTEMS  : All other systems reviewed and negative except where noted in the History of Present Illness.   PHYSICAL EXAM: BP 108/62    Pulse 64    Ht 5\' 1"  (1.549 m)    Wt 155 lb 6.4 oz (70.5 kg)    BMI 29.36 kg/m  General: Well developed female in no acute distress Head: Normocephalic and atraumatic Eyes:  Sclerae anicteric, conjunctiva pink. Ears: Normal auditory acuity Lungs: Clear throughout to auscultation; no W/R/R. Heart: Regular rate and rhythm; no M/R/G. Abdomen: Soft, non-distended.  BS present.  Non-tender. Musculoskeletal: Symmetrical with no gross deformities  Skin: No lesions on visible extremities Extremities: No edema  Neurological: Alert oriented x 4, grossly non-focal Psychological:  Alert and cooperative. Normal mood and affect  ASSESSMENT AND PLAN: *21 year old female with complaints of nausea, occasional vomiting, generalized abdominal pain, diarrhea.  Does admit to some lactose intolerance.  Celiac testing negative.  Previous EGD and colonoscopy  about a year ago negative.  CT scan negative.  All labs have been unremarkable.  Stool study last month was positive for C. difficile toxin, but then PCR was negative.  There was some confusion because then reports said that she was not having diarrhea so they were not going to treat her, but that is obviously why the test was done and she continues to report diarrhea now.  Reports 2 diarrhea stools a day, no solid stools recently.  Since the stool studies were a month ago I am going to repeat both a C. difficile screen and PCR.  If toxin comes back positive again even if PCR is negative it may be worth treating her since she is complaining of diarrhea.  I think she likely has a component of IBS to her symptoms.  She adamantly denied marijuana use, but myself and other staff members were sure that we could smell marijuana on her person so possibly someone who she was with could have been using it but she was not questioned about that specifically.  She has hyoscyamine to use as  needed, but I am going to prescribe Bentyl 20 mg twice daily instead to see if that works better.  There is an order in to have her TSH checked so we will do that today as well.  We would have her follow-up with Dr. Lyndel Safe at next available, 6 or so weeks from now if possible.  Of note, she was asking about having an MRI performed to be sure that she does not have stones in her bile duct.  I advised that her LFTs are normal, her pain is not localized to the right upper quadrant, and CT scan shows no evidence of biliary ductal dilatation, so I do not think that it is appropriate to order that at this time.   CC:  Nche, Charlene Brooke, NP

## 2021-07-08 ENCOUNTER — Telehealth: Payer: Self-pay | Admitting: Genetic Counselor

## 2021-07-08 NOTE — Telephone Encounter (Signed)
Scheduled appt per 2/23 referral. Pt is aware of appt date and time. Pt is aware to arrive 15 mins prior to appt time and to bring and updated insurance card. Pt is aware of appt location.   °

## 2021-07-20 NOTE — Progress Notes (Signed)
Agree with the plan RG 

## 2021-08-03 ENCOUNTER — Encounter: Payer: 59 | Admitting: Nurse Practitioner

## 2021-08-11 ENCOUNTER — Inpatient Hospital Stay: Payer: 59

## 2021-08-11 ENCOUNTER — Ambulatory Visit (INDEPENDENT_AMBULATORY_CARE_PROVIDER_SITE_OTHER): Payer: 59 | Admitting: *Deleted

## 2021-08-11 ENCOUNTER — Other Ambulatory Visit: Payer: Self-pay

## 2021-08-11 ENCOUNTER — Inpatient Hospital Stay: Payer: 59 | Attending: Genetic Counselor | Admitting: Genetic Counselor

## 2021-08-11 DIAGNOSIS — Z3042 Encounter for surveillance of injectable contraceptive: Secondary | ICD-10-CM

## 2021-08-11 DIAGNOSIS — Z8481 Family history of carrier of genetic disease: Secondary | ICD-10-CM | POA: Diagnosis not present

## 2021-08-11 LAB — GENETIC SCREENING ORDER

## 2021-08-11 MED ORDER — MEDROXYPROGESTERONE ACETATE 150 MG/ML IM SUSP
150.0000 mg | Freq: Once | INTRAMUSCULAR | Status: AC
  Administered 2021-08-11: 150 mg via INTRAMUSCULAR

## 2021-08-11 NOTE — Progress Notes (Signed)
Next injection June 15-29th ?

## 2021-08-12 ENCOUNTER — Encounter: Payer: Self-pay | Admitting: Genetic Counselor

## 2021-08-12 NOTE — Progress Notes (Signed)
REFERRING PROVIDER: ?Nche, Charlene Brooke, NP ?[place] [address] ? ?PRIMARY PROVIDER:  ?Flossie Buffy, NP ? ?PRIMARY REASON FOR VISIT:  ?1. Family history of gene mutation   ? ?HISTORY OF PRESENT ILLNESS:   ?Rebecca Rocha, a 21 y.o. female, was seen for a [facility] cancer genetics consultation at the request of Dr. Lorayne Marek due to a family history of cancer.  Rebecca Rocha presents to clinic today to discuss the possibility of a hereditary predisposition to cancer, to discuss genetic testing, and to further clarify her future cancer risks, as well as potential cancer risks for family members.  ? ?Rebecca Rocha is a 21 y.o. female with no personal history of cancer.   ? ?CANCER HISTORY:  ?Oncology History  ? No history exists.  ? ? ?RISK FACTORS:  ?First live birth at age 5.  ?OCP use for approximately 2 years.  ?Ovaries intact: yes.  ?Uterus intact: yes.  ?Menopausal status: premenopausal.  ?HRT use: 0 years. ?Colonoscopy: yes; 2022,  no polyps . ?Mammogram within the last year: no. ?Up to date with pelvic exams: yes. ?Any excessive radiation exposure in the past: no ? ?Past Medical History:  ?Diagnosis Date  ? Acute calculous cholecystitis 11/26/2019  ? Allergy   ? Anxiety   ? Asthma   ? Depression   ? Menstrual migraine, intractable, without status migrainosus   ? Panic attack   ? Sinusitis   ? Sleep apnea   ? Syncope and collapse   ? Vertigo of central origin, unspecified ear   ? ? ?Past Surgical History:  ?Procedure Laterality Date  ? CHOLECYSTECTOMY N/A 11/26/2019  ? Procedure: LAPAROSCOPIC CHOLECYSTECTOMY;  Surgeon: Kinsinger, Arta Bruce, MD;  Location: [facility];  Service: General;  Laterality: N/A;  ? ? ?Social History  ? ?Socioeconomic History  ? Marital status: Married  ?  Spouse name: Not on file  ? Number of children: 0  ? Years of education: Not on file  ? Highest education level: Not on file  ?Occupational History  ? Not on file  ?Tobacco Use   ? Smoking status: Never  ? Smokeless tobacco: Never  ?Vaping Use  ? Vaping Use: Never used  ?Substance and Sexual Activity  ? Alcohol use: Yes  ?  Comment: RARE  ? Drug use: No  ? Sexual activity: Yes  ?  Partners: Male  ?  Birth control/protection: Implant, Injection  ?  Comment: 1st intercourse- 15, partners- 16, current partnER- 4 YRS  ?Other Topics Concern  ? Not on file  ?Social History Narrative  ? Student at [employer]  ? ?Social Determinants of Health  ? ?Financial Resource Strain: Not on file  ?Food Insecurity: Not on file  ?Transportation Needs: Not on file  ?Physical Activity: Not on file  ?Stress: Not on file  ?Social Connections: Not on file  ?  ? ?FAMILY HISTORY:  ?We obtained a detailed, 4-generation family history.  Significant diagnoses are listed below: ?Family History  ?Problem Relation Age of Onset  ? Other Mother   ?     Negative hereditary cancer genetic testing  ? Breast cancer Maternal Grandmother 13  ? Cancer Paternal Grandfather   ? Other Cousin   ?     maternal second cousin, BRCA2+  ? ? ? ?Ms. Coutts's mother had negative hereditary cancer genetic testing (8 genes) and a VUS in the RAD50 gene. Her maternal second cousin has a BRCA2 gene mutation. Her maternal grandmother was diagnosed with breast  cancer at age 62. Her paternal grandfather was diagnosed with an unknown type of cancer at an unknown age (>50), he is deceased. There is no reported Ashkenazi Jewish ancestry.  ? ?GENETIC COUNSELING ASSESSMENT: Rebecca Rocha is a 21 y.o. female with a family history of cancer. We, therefore, discussed and recommended the following at today's visit.  ? ?DISCUSSION: We discussed that 5 - 10% of cancer is hereditary, with most cases of hereditary cancer breast cancer associated with BRCA1/2. There are other genes that can be associated with hereditary cancer syndromes.  We discussed that testing is beneficial for several reasons, including knowing about other cancer risks,  identifying potential screening and risk-reduction options that may be appropriate, and to understanding if other family members could be at risk for cancer and allowing them to undergo genetic testing. ? ?We reviewed the characteristics, features and inheritance patterns of hereditary cancer syndromes. We also discussed genetic testing, including the appropriate family members to test, the process of testing, insurance coverage and turn-around-time for results. We discussed the implications of a negative, positive, carrier and/or variant of uncertain significant result.  ? ?Rebecca Rocha's mother provided verbal consent to discuss her results. Her mother's negative genetic testing means that the 47 genes Rebecca Rocha inherited from her mother (including BRCA2) are negative. Therefore, she is not at risk for inheriting the familial BRCA2 gene mutation. However, she could have inherited a gene mutation from her father. Her paternal family medical history is not concerning for a hereditary cancer syndrome. However, she would still like to be proactive and pursue hereditary cancer genetic testing.  ? ?Rebecca Rocha was offered a common hereditary cancer panel (47 genes) and an expanded pan-cancer panel (77 genes). Rebecca Rocha was informed of the benefits and limitations of each panel, including that expanded pan-cancer panels contain several genes that do not have clear management guidelines at this point in time.  We also discussed that as the number of genes included on a panel increases, the chances of variants of uncertain significance increases.  After considering the benefits and limitations of each gene panel, Rebecca Rocha elected to have [facility] CancerNext-Expanded Panel. ? ?The CancerNext-Expanded gene panel offered by [facility] and includes sequencing, rearrangement, and RNA analysis for the following 77 genes: AIP, ALK, APC, ATM, AXIN2, BAP1, BARD1, BLM,  BMPR1A, BRCA1, BRCA2, BRIP1, CDC73, CDH1, CDK4, CDKN1B, CDKN2A, CHEK2, CTNNA1, DICER1, FANCC, FH, FLCN, GALNT12, KIF1B, LZTR1, MAX, MEN1, MET, MLH1, MSH2, MSH3, MSH6, MUTYH, NBN, NF1, NF2, NTHL1, PALB2, PHOX2B, PMS2, POT1, PRKAR1A, PTCH1, PTEN, RAD51C, RAD51D, RB1, RECQL, RET, SDHA, SDHAF2, SDHB, SDHC, SDHD, SMAD4, SMARCA4, SMARCB1, SMARCE1, STK11, SUFU, TMEM127, TP53, TSC1, TSC2, VHL and XRCC2 (sequencing and deletion/duplication); EGFR, EGLN1, HOXB13, KIT, MITF, PDGFRA, POLD1, and POLE (sequencing only); EPCAM and GREM1 (deletion/duplication only).   ? ?Based on Rebecca Rocha's family history of cancer, she does not meet medical criteria for genetic testing since her mother had negative genetic testing. She may have an out of pocket cost. We discussed that if her out of pocket cost for testing is over $100, the laboratory will call and confirm whether she wants to proceed with testing.  If the out of pocket cost of testing is less than $100 she will be billed by the genetic testing laboratory.  ? ?PLAN: After considering the risks, benefits, and limitations, Rebecca Rocha provided informed consent to pursue genetic testing and the blood sample was sent to [facility] for analysis of the CancerNext-Expanded Panel. Results should be available within  approximately 2-3 weeks' time, at which point they will be disclosed by telephone to Ms. Eisenhart, as will any additional recommendations warranted by these results. Ms. Bloodgood will receive a summary of her genetic counseling visit and a copy of her results once available. This information will also be available in Epic.  ? ?Ms. Ogarro's questions were answered to her satisfaction today. Our contact information was provided should additional questions or concerns arise. Thank you for the referral and allowing [place] to share in the care of your patient.  ? ?Lucille Passy, MS, [place] ?Genetic  Counselor ?[email or web address] ?(P) [phone] ? ?The patient was seen for a total of 35 minutes in face-to-face genetic counseling. The patient was seen alone.  Drs. Lindi Adie and/or Burr Medico were available to discuss this case as n

## 2021-08-25 ENCOUNTER — Encounter: Payer: Self-pay | Admitting: Genetic Counselor

## 2021-08-25 ENCOUNTER — Telehealth: Payer: Self-pay | Admitting: Genetic Counselor

## 2021-08-25 DIAGNOSIS — Z1379 Encounter for other screening for genetic and chromosomal anomalies: Secondary | ICD-10-CM | POA: Insufficient documentation

## 2021-08-25 NOTE — Telephone Encounter (Signed)
I contacted Rebecca Rocha to discuss her genetic testing results. No pathogenic variants were identified in the 77 genes analyzed. Detailed clinic note to follow. ? ?The test report has been scanned into EPIC and is located under the Molecular Pathology section of the Results Review tab.  A portion of the result report is included below for reference.  ? ?Lalla Brothers, MS, LCGC ?Genetic Counselor ?[name] ?(P) [phone] ? ? ?

## 2021-08-26 ENCOUNTER — Ambulatory Visit: Payer: Self-pay | Admitting: Genetic Counselor

## 2021-08-26 DIAGNOSIS — Z1379 Encounter for other screening for genetic and chromosomal anomalies: Secondary | ICD-10-CM

## 2021-08-26 NOTE — Progress Notes (Signed)
HPI:   ?Rebecca Rocha was previously seen in the [facility] due to a family history of cancer and concerns regarding a hereditary predisposition to cancer. Please refer to our prior cancer genetics clinic note for more information regarding our discussion, assessment and recommendations, at the time. Rebecca Rocha recent genetic test results were disclosed to her, as were recommendations warranted by these results. These results and recommendations are discussed in more detail below. ? ?CANCER HISTORY:  ?Oncology History  ? No history exists.  ? ? ?FAMILY HISTORY:  ?We obtained a detailed, 4-generation family history.  Significant diagnoses are listed below: ?     ?Family History  ?Problem Relation Age of Onset  ? Other Mother    ?      Negative hereditary cancer genetic testing  ? Breast cancer Maternal Grandmother 57  ? Cancer Paternal Grandfather    ? Other Cousin    ?      maternal second cousin, BRCA2+  ?  ?  ?Rebecca Rocha's mother had negative hereditary cancer genetic testing (47 genes) and a VUS in the RAD50 gene. Her maternal second cousin has a BRCA2 gene mutation. Her maternal grandmother was diagnosed with breast cancer at age 57. Her paternal grandfather was diagnosed with an unknown type of cancer at an unknown age (>50), he is deceased. There is no reported Ashkenazi Jewish ancestry.  ? ?GENETIC TEST RESULTS:  ?The [facility] CancerNext-Expanded Panel found no pathogenic mutations.  ? ?The CancerNext-Expanded gene panel offered by [facility] and includes sequencing, rearrangement, and RNA analysis for the following 77 genes: AIP, ALK, APC, ATM, AXIN2, BAP1, BARD1, BLM, BMPR1A, BRCA1, BRCA2, BRIP1, CDC73, CDH1, CDK4, CDKN1B, CDKN2A, CHEK2, CTNNA1, DICER1, FANCC, FH, FLCN, GALNT12, KIF1B, LZTR1, MAX, MEN1, MET, MLH1, MSH2, MSH3, MSH6, MUTYH, NBN, NF1, NF2, NTHL1, PALB2, PHOX2B, PMS2, POT1, PRKAR1A, PTCH1, PTEN, RAD51C, RAD51D, RB1, RECQL, RET, SDHA,  SDHAF2, SDHB, SDHC, SDHD, SMAD4, SMARCA4, SMARCB1, SMARCE1, STK11, SUFU, TMEM127, TP53, TSC1, TSC2, VHL and XRCC2 (sequencing and deletion/duplication); EGFR, EGLN1, HOXB13, KIT, MITF, PDGFRA, POLD1, and POLE (sequencing only); EPCAM and GREM1 (deletion/duplication only).  ? ?The test report has been scanned into EPIC and is located under the Molecular Pathology section of the Results Review tab.  A portion of the result report is included below for reference. Genetic testing reported out on 08/23/2021.  ? ? ? ? ? ?Even though a pathogenic variant was not identified, possible explanations for the cancer in the family may include: ?There may be no hereditary risk for cancer in the family. The cancers in her family may be due to other genetic or environmental factors. ?There may be a gene mutation in one of these genes that current testing methods cannot detect, but that chance is small. ?There could be another gene that has not yet been discovered, or that we have not yet tested, that is responsible for the cancer diagnoses in the family.  ?It is also possible there is a hereditary cause for the cancer in the family that Rebecca Rocha did not inherit. ? ?Therefore, it is important to remain in touch with cancer genetics in the future so that we can continue to offer Rebecca Rocha the most up to date genetic testing.  ? ?ADDITIONAL GENETIC TESTING:  ?We discussed with Rebecca Rocha that her genetic testing was fairly extensive.  If there are genes identified to increase cancer risk that can be analyzed in the future, we would be happy to discuss and coordinate this   testing at that time.   ? ?CANCER SCREENING RECOMMENDATIONS:  ?Rebecca Rocha test result is considered negative (normal).  This means that we have not identified a hereditary cause for her family history of cancer at this time.  ? ?An individual's cancer risk and medical management are not determined by genetic test  results alone. Overall cancer risk assessment incorporates additional factors, including personal medical history, family history, and any available genetic information that may result in a personalized plan for cancer prevention and surveillance. Therefore, it is recommended she continue to follow the cancer management and screening guidelines provided by her primary healthcare provider. ? ?RECOMMENDATIONS FOR FAMILY MEMBERS:  ?Individuals in this family might be at some increased risk of developing cancer, over the general population risk, due to the family history of cancer. We recommend women in this family have a yearly mammogram beginning at age 27, or 63 years younger than the earliest onset of cancer, an annual clinical breast exam, and perform monthly breast self-exams.  ?Other members of the family may still carry a pathogenic variant in one of these genes that Rebecca Rocha did not inherit. Based on the family history, we recommend her maternal grandmother consider genetic counseling and testing.  ? ?FOLLOW-UP:  ?Cancer genetics is a rapidly advancing field and it is possible that new genetic tests will be appropriate for her and/or her family members in the future. We encouraged her to remain in contact with cancer genetics on an annual basis so we can update her personal and family histories and let her know of advances in cancer genetics that may benefit this family.  ? ?Our contact number was provided. Rebecca Rocha's questions were answered to her satisfaction, and she knows she is welcome to call us at anytime with additional questions or concerns.  ? ?Rebecca Passy, MS, [place] ?Genetic Counselor ?[email or web address] ?(P) [phone] ? ? ?

## 2021-09-15 ENCOUNTER — Other Ambulatory Visit: Payer: Self-pay | Admitting: Nurse Practitioner

## 2021-09-15 DIAGNOSIS — J4520 Mild intermittent asthma, uncomplicated: Secondary | ICD-10-CM

## 2021-09-19 ENCOUNTER — Encounter: Payer: Self-pay | Admitting: Nurse Practitioner

## 2021-09-19 ENCOUNTER — Ambulatory Visit (INDEPENDENT_AMBULATORY_CARE_PROVIDER_SITE_OTHER): Payer: 59 | Admitting: Nurse Practitioner

## 2021-09-19 ENCOUNTER — Ambulatory Visit (HOSPITAL_COMMUNITY)
Admission: EM | Admit: 2021-09-19 | Discharge: 2021-09-19 | Disposition: A | Discharge status: Home / Self Care | Payer: 59 | Attending: Family | Admitting: Family

## 2021-09-19 VITALS — BP 112/72 | HR 87 | Temp 97.4°F | Ht 60.0 in | Wt 162.2 lb

## 2021-09-19 DIAGNOSIS — Z9152 Personal history of nonsuicidal self-harm: Secondary | ICD-10-CM | POA: Insufficient documentation

## 2021-09-19 DIAGNOSIS — F332 Major depressive disorder, recurrent severe without psychotic features: Secondary | ICD-10-CM | POA: Diagnosis not present

## 2021-09-19 DIAGNOSIS — Z7289 Other problems related to lifestyle: Secondary | ICD-10-CM | POA: Diagnosis not present

## 2021-09-19 DIAGNOSIS — F331 Major depressive disorder, recurrent, moderate: Secondary | ICD-10-CM | POA: Diagnosis not present

## 2021-09-19 DIAGNOSIS — F411 Generalized anxiety disorder: Secondary | ICD-10-CM | POA: Diagnosis not present

## 2021-09-19 MED ORDER — HYDROXYZINE PAMOATE 25 MG PO CAPS: 25.0000 mg | ORAL_CAPSULE | Freq: Three times a day (TID) | ORAL | 0 refills | Status: DC | PRN

## 2021-09-19 NOTE — ED Provider Notes (Signed)
Behavioral Health Urgent Care Medical Screening Exam ? ?Patient Name: Rebecca Rocha ?MRN: [id number] ?Date of Evaluation: 09/20/21 ?Chief Complaint:   ?Diagnosis:  ?Final diagnoses:  ?MDD (major depressive disorder), recurrent episode, moderate ([place])  ?GAD (generalized anxiety disorder)  ? ? ?History of Present illness: Rebecca Rocha is a 21 y.o. female.  Presents to [facility] urgent care after seeing her primary care provider who advised her to follow-up for mental health assessment.  She is denying suicidal or homicidal ideations.  Denies auditory visual hallucinations.  Reports last week she was feeling stressed and overwhelmed and cut her arms bilaterally.  Denied that this was a suicidal gesture.  Chart review patient was initiated on Wellbutrin and hydroxyzine however states she stopped taking medications because she did not feel that they were effective.  She denied illicit drug use or substance abuse history.  Patient is accompanied by her mom.  She provided authorization for her mother to sitting during the assessment.  Mother denied any safety concerns with patient returning home.  States she resides with her husband denied any marital or financial concerns currently. ? ?Rebecca Rocha, 21 y.o., female patient seen face to face by this provider, chart reviewed on 09/20/21.  On evaluation Rebecca Rocha reports " high anxiety."  ? ? ?During evaluation Rebecca Rocha is sitting in no acute distress. She is alert/oriented x 4; calm/cooperative; and mood congruent with affect. She is speaking in a clear tone at moderate volume, and normal pace; with good eye contact. Her thought process is coherent and relevant; There is no indication that she is currently responding to internal/external stimuli or experiencing delusional thought content; and she has denied suicidal/self-harm/homicidal ideation, psychosis, and paranoia.   ?Patient has remained calm  throughout assessment and has answered questions appropriately.   ? ? ?At this time Rebecca Rocha is educated and verbalizes understanding of mental health resources and other crisis services in the community.She is instructed to call 911 and present to the nearest emergency room should she experience any suicidal/homicidal ideation, auditory/visual/hallucinations, or detrimental worsening of her mental health condition. She was a also advised by Probation officer that she could call the toll-free phone on insurance card to assist with identifying in network counselors and agencies or number on back of Medicaid card t speak with care coordinator ?  ? ?Psychiatric Specialty Exam ? ?Presentation  ?General Appearance:Appropriate for Environment ? ?Eye Contact:Good ? ?Speech:Clear and Coherent ? ?Speech Volume:Normal ? ?Handedness:Right ? ? ?Mood and Affect  ?Mood:Anxious; Depressed ?Affect:Congruent ? ?Thought Process  ?Thought Processes:Coherent ?Descriptions of Associations:Intact ? ?Orientation:Full (Time, Place and Person) ? ?Thought Content:Logical ?   Hallucinations:None ? ?Ideas of Reference:None ? ?Suicidal Thoughts:No ? ?Homicidal Thoughts:No ? ? ?Sensorium  ?Memory:Immediate Good; Recent Good; Remote Good ?Judgment:Good ?Insight:Good ? ?Executive Functions  ?Concentration:Fair ?Attention Span:Good ?Recall:Good ?[place] of [place] ?Language:Fair ? ?Psychomotor Activity  ?Psychomotor Activity:Normal ? ?Assets  ?Assets:Desire for Improvement; Social Support ? ?Sleep  ?Sleep:Fair ?Number of hours: No data recorded ? ?Nutritional Assessment (For OBS and FBC admissions only) ?Has the patient had a weight loss or gain of 10 pounds or more in the last 3 months?: No ?Has the patient had a decrease in food intake/or appetite?: No ?Does the patient have dental problems?: No ?Does the patient have eating habits or behaviors that may be indicators of an eating disorder including binging or inducing vomiting?: No ?Has  the patient recently lost weight without trying?: 0 ?Has the patient been eating poorly because of a decreased appetite?: 0 ?  Malnutrition Screening Tool Score: 0 ? ? ? ?Physical Exam: ?Physical Exam ?Vitals and nursing note reviewed.  ?Cardiovascular:  ?   Rate and Rhythm: Normal rate.  ?Skin: ? ?    ?   Comments: Multiple superficial lacerations to bilateral arms which she reports using scissors due to feeling anxious and distressed  ?Neurological:  ?   Mental Status: She is oriented to person, place, and time.  ?Psychiatric:     ?   Mood and Affect: Mood normal.     ?   Speech: Speech normal.     ?   Behavior: Behavior normal. Behavior is cooperative.     ?   Thought Content: Thought content normal.     ?   Cognition and Memory: Cognition normal.     ?   Judgment: Judgment normal.  ? ?Review of Systems  ?Eyes: Negative.   ?Genitourinary: Negative.   ?Musculoskeletal: Negative.   ?Psychiatric/Behavioral:  Positive for depression. Negative for suicidal ideas. The patient is nervous/anxious.   ?All other systems reviewed and are negative. ?Blood pressure (!) 144/103, pulse 60, resp. rate 18, SpO2 100 %. There is no height or weight on file to calculate BMI. ? ?Musculoskeletal: ?Strength & Muscle Tone: within normal limits ?Gait & Station: normal ?Patient leans: N/A ? ? ?[facility] MSE Discharge Disposition for Follow up and Recommendations: ?Based on my evaluation the patient does not appear to have an emergency medical condition and can be discharged with resources and follow up care in outpatient services for Medication Management, Individual Therapy, and Group Therapy ? ? ?Derrill Center, NP ?09/20/2021, 11:27 AM ? ?

## 2021-09-19 NOTE — Progress Notes (Signed)
? ?             Established Patient Visit ? ?Patient: Rebecca Rocha   DOB: August 21, 2000   21 y.o. Female  MRN: [id number] ?Visit Date: 09/19/2021 ? ?Subjective:  ?  ?Chief Complaint  ?Patient presents with  ? Acute Visit  ?  Has been stressed about losing job two weeks ago. ?Used a knife Thursday 09/15/21 to cut both of her arms and needs to make sure they do not get infected.  ?  ? ?HPI ?Depression, recurrent (HCC) ?Worsening depressed mood with recent loss of job.  ?On Thursday, she used a kitchen knife to cut bilateral upper arms. Her husband and mother are aware of her current state of mind. Today she denies any SI/HI/hallucinations. ?She was unable to schedule appt with psychiatry. ?Wounds are superficial and healing with no signs of infection. ?With Ms. Rebecca Rocha permission, I tried to reach her husband by telephone but no response. I talked to her Mother-Rebecca Rocha by telephone. She states she is aware of Thursday's incident. ?We discussed the need for a psychiatry evaluation at the [facility] center today. They agreed with suggestion. Rebecca Rocha agreed to drive to clinic and her mother will meet her there. They wre provided with the address. ? ? ?  09/19/2021  ?  9:20 AM 07/07/2021  ?  1:31 PM 05/31/2021  ?  1:48 PM  ?Depression screen PHQ 2/9  ?Decreased Interest 3 1 3   ?Down, Depressed, Hopeless 3 2 1   ?PHQ - 2 Score 6 3 4   ?Altered sleeping 3 1 3   ?Tired, decreased energy 3 1 3   ?Change in appetite 3 2 3   ?Feeling bad or failure about yourself  3 0 0  ?Trouble concentrating 3 0 3  ?Moving slowly or fidgety/restless 1 0 0  ?Suicidal thoughts 2 0 0  ?PHQ-9 Score 24 7 16   ?Difficult doing work/chores Extremely dIfficult Somewhat difficult Very difficult  ?  ? ?  09/19/2021  ?  9:20 AM 07/07/2021  ?  1:31 PM 05/31/2021  ?  1:49 PM 02/02/2021  ? 10:09 AM  ?GAD 7 : Generalized Anxiety Score  ?Nervous, Anxious, on Edge 3 1 3 3   ?Control/stop worrying 3 1 2 3   ?Worry too much - different things 3 1  3 3   ?Trouble relaxing 3 1 3 3   ?Restless 3 0 0 2  ?Easily annoyed or irritable 3 1 3 3   ?Afraid - awful might happen 3 1 0 2  ?Total GAD 7 Score 21 6 14 19   ?Anxiety Difficulty Extremely difficult Somewhat difficult Extremely difficult Extremely difficult  ? ?Reviewed medical, surgical, and social history today ? ?Medications: ?Outpatient Medications Prior to Visit  ?Medication Sig  ? albuterol (PROVENTIL) (2.5 MG/3ML) 0.083% nebulizer solution Take 3 mLs (2.5 mg total) by nebulization every 6 (six) hours as needed for wheezing or shortness of breath.  ? albuterol (VENTOLIN HFA) 108 (90 Base) MCG/ACT inhaler 1-2 PUFFS EVERY 6 HOURS FOR WHEEZING OR SHORTNESS OF BREATH.  ? buPROPion (WELLBUTRIN SR) 150 MG 12 hr tablet Take 1 tablet (150 mg total) by mouth 2 (two) times daily.  ? dicyclomine (BENTYL) 20 MG tablet Take 1 tablet (20 mg total) by mouth 2 (two) times daily.  ? etonogestrel (NEXPLANON) 68 MG IMPL implant 1 each by Subdermal route once.  ? fluticasone (FLOVENT HFA) 110 MCG/ACT inhaler Inhale 1 puff into the lungs 2 (two) times daily. Rinse mouth after each use  ? gabapentin (NEURONTIN) 100  MG capsule Take 1cap in AM, 1cap in PM and 2cap at bedtime.  ? hyoscyamine (ANASPAZ) 0.125 MG TBDP disintergrating tablet Place 1 tablet (0.125 mg total) under the tongue every 6 (six) hours as needed for cramping.  ? ondansetron (ZOFRAN) 4 MG tablet Take 1 tablet (4 mg total) by mouth every 6 (six) hours as needed for nausea or vomiting.  ? pantoprazole (PROTONIX) 40 MG tablet Take 1 tablet (40 mg total) by mouth daily.  ? phenazopyridine (PYRIDIUM) 95 MG tablet Take by mouth.  ? ?No facility-administered medications prior to visit.  ? ?Reviewed past medical and social history.  ? ?ROS per HPI above ? ? ?   ?Objective:  ?BP 112/72 (BP Location: Right Arm, Patient Position: Sitting, Cuff Size: Small)   Pulse 87   Temp (!) 97.4 ?F (36.3 ?C) (Temporal)   Ht 5' (1.524 m)   Wt 162 lb 3.2 oz (73.6 kg)   SpO2 98%   BMI  31.68 kg/m?  ? ?  ? ?Physical Exam ?Constitutional:   ?   General: She is not in acute distress. ?Neurological:  ?   Mental Status: She is alert.  ?Psychiatric:     ?   Attention and Perception: Attention normal.     ?   Mood and Affect: Affect is flat.     ?   Speech: Speech normal.     ?   Behavior: Behavior is cooperative.     ?   Thought Content: Thought content is not paranoid or delusional. Thought content does not include homicidal or suicidal ideation. Thought content does not include homicidal or suicidal plan.     ?   Cognition and Memory: Cognition normal.     ?   Judgment: Judgment normal.  ?  ?No results found for any visits on 09/19/21. ?   ?Assessment & Plan:  ?  ?Problem List Items Addressed This Visit   ? ?  ? Other  ? Depression, recurrent (HCC) - Primary  ?  Worsening depressed mood with recent loss of job.  ?On Thursday, she used a kitchen knife to cut bilateral upper arms. Her husband and mother are aware of her current state of mind. Today she denies any SI/HI/hallucinations. ?She was unable to schedule appt with psychiatry. ?Wounds are superficial and healing with no signs of infection. ?With Ms. Rebecca Rocha permission, I tried to reach her husband by telephone but no response. I talked to her Mother-Rebecca Rocha by telephone. She states she is aware of Thursday's incident. ?We discussed the need for a psychiatry evaluation at the [facility] center today. They agreed with suggestion. Rebecca Rocha agreed to drive to clinic and her mother will meet her there. They wre provided with the address. ? ?  ?  ? Self-mutilation  ? ?Return in about 4 weeks (around 10/17/2021) for Mood Disorder. ? ?  ? ?Alysia Penna, NP ? ? ?

## 2021-09-19 NOTE — ED Triage Notes (Signed)
Pt presents to [facility] with her mother seeking a mental health evaluation. Pt was seen by her PCP today at [facility] and was recommended to [facility] due to NSSIB. Pt has cuts down both forearms, that appear to be healing. Pt states she did this on friday due to stress of being unemployed. Pt states that she does not want to kill herself, this was her way of relieving stress. Pt states that she would like to get set up with outpatient services. Pt denies having a psychiatrist or therapist. Pt reports being diagnosed  with Bipolar 1, MDD and anxiety but has not been prescribed any medication. Pt state that her husband removed all weapons from the home, so she does not currently have access to anything to harm herself and does not want to harm herself at this time. Pt denies SI/HI and AVH. ?

## 2021-09-19 NOTE — Assessment & Plan Note (Signed)
Worsening depressed mood with recent loss of job.  ?On Thursday, she used a kitchen knife to cut bilateral upper arms. Her husband and mother are aware of her current state of mind. Today she denies any SI/HI/hallucinations. ?She was unable to schedule appt with psychiatry. ?Wounds are superficial and healing with no signs of infection. ?With [name] permission, I tried to reach her husband by telephone but no response. I talked to her Mother-[name] by telephone. She states she is aware of Thursday's incident. ?We discussed the need for a psychiatry evaluation at the [facility] center today. They agreed with suggestion. Zhane agreed to drive to clinic and her mother will meet her there. They wre provided with the address. ?

## 2021-09-19 NOTE — Discharge Instructions (Addendum)
Take all medications as prescribed. Keep all follow-up appointments as scheduled.  Do not consume alcohol or use illegal drugs while on prescription medications. Report any adverse effects from your medications to your primary care provider promptly.  In the event of recurrent symptoms or worsening symptoms, call 911, a crisis hotline, or go to the nearest emergency department for evaluation.   

## 2021-09-19 NOTE — ED Notes (Signed)
Pt discharged with AVS in hand. Verbalized understanding of discharge instructions reviewed by staff. Safety maintained. °

## 2021-09-19 NOTE — Patient Instructions (Signed)
Go to [facility], [address] [place], [place] [address] for an evaluation. ? ?Suicidal Feelings: How to Help Yourself ?Suicide is when you end your own life. Suicidal ideation includes expressing thoughts about, or a preoccupation with, ending your own life. There are many things you can do to help yourself feel better when struggling with these feelings. Many services and people are available to support you and others who struggle with similar feelings. ?If you ever feel like you may hurt yourself or others, or have thoughts about taking your own life, get help right away. To get help: ?Go to your nearest emergency department. ?Call your local emergency services (911 in the [place] ?Call the [employer] health and human services helpline (211 in the [place] ?Call or text a suicide hotline to speak with a trained counselor. The following suicide hotlines are available in the [place]: ?1-800-273-TALK (1-316-505-2590 or 988 in the [place] ?1-800-SUICIDE 978-881-1277). ?Text 845-375-3183. This is the Crisis Text Line in the [place] ?(575)350-0532. This is a hotline for Spanish speakers. ?978-275-2213. This is a hotline for TTY users. ?1-866-4-U-TREVOR 807 108 5874). This is a hotline for lesbian, gay, bisexual, transgender, or questioning youth. ?For a list of hotlines in [place], visit AmenCredit.is.html ?Contact a crisis center or a local suicide prevention center. To find a crisis center or suicide prevention center: ?Call your local hospital, clinic, community service organization, mental health center, social service provider, or health department. Ask for help with connecting to a crisis center. ?For a list of crisis centers in the [place], visit: suicidepreventionlifeline.org ?For a list of crisis centers in [place], visit: suicideprevention.ca ?How to help yourself feel better ? ?Promise yourself that you will not do anything bad or  extreme when you have suicidal feelings. Remember the times you have felt hopeful. ?Many people have gotten through suicidal thoughts and feelings, and you can too. ?If you have had these feelings before, remind yourself that you can get through them again. ?Let family, friends, teachers, or counselors know how you are feeling. Do not separate yourself from those who care about you and want to help you. ?Talk with someone every day, even if you do not feel like talking to anyone or being with other people. ?Face-to-face conversation is best to help them understand your feelings. ?Contact a mental health care provider and work with this person regularly. ?Make a safety plan that you can follow during a crisis. ?Include phone numbers of suicide prevention hotlines, mental health professionals, and trusted friends and family members you can call during an emergency. ?Save these numbers on your phone. ?If you are thinking of taking a lot of medicine, give your medicine to someone who can give it to you as prescribed. ?If you are on antidepressants and are concerned you will overdose, tell your health care provider so that he or she can give you safer medicines. ?Try to stick to your routines and follow a schedule every day. Make self-care a priority. ?Make a list of realistic goals, and cross them off when you achieve them. Accomplishments can give you a sense of worth. ?Wait until you are feeling better before doing things that you find difficult or unpleasant. ?Do things that you have always enjoyed to take your mind off your feelings. ?Try reading a book, or listening to or playing music. ?Spending time outside, in nature, may help you feel better. ?Follow these instructions at home: ? ?Visit your primary health care provider every year for a physical  and a mental health checkup. ?Take over-the-counter and prescription medicines only as told by your health care provider. ?Ask your health care provider about the  possible side effects of any medicines you are taking. ?Ask your health care provider about whether suicidal ideation is a possible side effect of any of your medicines. ?Learn about suicidal ideation and what increases the risk for the development of suicidal thoughts. ?Eat a well-balanced diet, and eat regular meals. ?Get plenty of rest. ?Exercise if you are able. Just 30 minutes of exercise each day can help you feel better. ?Keep your living space well lit. ?Do not use alcohol or drugs. Remove these substances from your home. ?General recommendations ?Remove weapons, poisons, knives, and other deadly items from your home. ?Work with a mental health care provider as needed. ?When you are feeling well, write yourself a letter with tips and support that you can read when you are not feeling well. ?Remember that life's difficulties can be sorted out with help. Conditions can be treated, and you can learn behaviors and ways of thinking that will help you. ?Work with your health care provider or counselor to learn ways of coping with your thoughts and feelings. ?Where to find more information ?National Suicide Prevention Lifeline: www.suicidepreventionlifeline.org ?Hopeline: www.hopeline.com ?[employer] for Suicide Prevention: https://www.ayers.com/ ?[employer] (for lesbian, gay, bisexual, transgender, or questioning youth): www.thetrevorproject.org ?[employer] of Mental Health: https://ramirez-williams.com/ ?Suicide Prevention Resources: FarmerBuys.com.au ?Contact a health care provider if: ?You feel as though you are a burden to others. ?You feel agitated, angry, vengeful, or have extreme mood swings. ?You have withdrawn from family and friends. ?You are frequently using drugs or alcohol. ?Get help right away if: ?You are talking about suicide or wishing to die. ?You start making plans for how to commit suicide. ?You feel that you have no reason to live. ?You  start making plans for putting your affairs in order, saying goodbye, or giving your possessions away. ?You feel guilt, shame, or unbearable pain, and it seems like there is no way out. ?You are engaging in risky behaviors that could lead to death. ?If you have any of these thoughts or symptoms, get help right away: ?Go to your nearest emergency department or crisis center. ?Call emergency services (911 in the [place].S[place] ?Call or text a suicide crisis helpline. ?Summary ?Suicide is when you take your own life. Suicidal feelings are thoughts about ending your own life. ?Promise yourself that you will not do anything bad or extreme when you have suicidal feelings. ?Let family, friends, teachers, or counselors know how you are feeling. ?Get help right away if you start making plans for how to commit suicide. ?This information is not intended to replace advice given to you by your health care provider. Make sure you discuss any questions you have with your health care provider. ?Document Revised: 11/25/2020 Document Reviewed: 09/09/2020 ?Elsevier Patient Education ? 2023 Elsevier Inc. ? ?

## 2021-09-20 NOTE — Telephone Encounter (Signed)
Chart supports Rx 

## 2021-09-21 ENCOUNTER — Other Ambulatory Visit: Payer: Self-pay | Admitting: Nurse Practitioner

## 2021-09-21 DIAGNOSIS — M7918 Myalgia, other site: Secondary | ICD-10-CM

## 2021-09-21 NOTE — Telephone Encounter (Signed)
Chart supports Rx ?Last seen 09/19/2021 ?Next OV 10/17/2021 ?

## 2021-09-22 ENCOUNTER — Telehealth: Payer: Self-pay

## 2021-09-22 NOTE — Telephone Encounter (Signed)
Patient called to ask if our office would prescribe D-P sub q injection for her to be able to administer at home instead of coming to office. ? ?I sent Chapman Fitch., RN a message to be confirm that our providers do not do this. ?

## 2021-09-22 NOTE — Telephone Encounter (Signed)
I called patient back and let her know this is not something we currently do but my Nurse supervisor is going to check with the physicians to confirm. I will call her back after she hears from the physicians. ?

## 2021-09-26 ENCOUNTER — Ambulatory Visit (INDEPENDENT_AMBULATORY_CARE_PROVIDER_SITE_OTHER): Payer: 59 | Admitting: Podiatry

## 2021-09-26 DIAGNOSIS — B351 Tinea unguium: Secondary | ICD-10-CM | POA: Diagnosis not present

## 2021-09-26 DIAGNOSIS — B353 Tinea pedis: Secondary | ICD-10-CM | POA: Diagnosis not present

## 2021-09-26 MED ORDER — TERBINAFINE HCL 250 MG PO TABS: 250.0000 mg | ORAL_TABLET | Freq: Every day | ORAL | 0 refills | Status: DC

## 2021-09-26 MED ORDER — CLOTRIMAZOLE-BETAMETHASONE 1-0.05 % EX CREA: 1.0000 "application " | TOPICAL_CREAM | Freq: Two times a day (BID) | CUTANEOUS | 1 refills | Status: DC

## 2021-09-26 NOTE — Progress Notes (Signed)
? ?  Subjective: ?21 y.o. female presenting today as a new patient for evaluation of discoloration and thickening to the toenails specifically to the right great toe and second toe.  Patient states that over the past few years the nails have become discolored and thickened.  Patient also experiences itching peeling skin to the left foot that has been chronic.  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 ?Past Medical History:  ?Diagnosis Date  ? Acute calculous cholecystitis 11/26/2019  ? Allergy   ? Anxiety   ? Asthma   ? Depression   ? Menstrual migraine, intractable, without status migrainosus   ? Panic attack   ? Sinusitis   ? Sleep apnea   ? Syncope and collapse   ? Vertigo of central origin, unspecified ear   ? ? ?Objective: ?Physical Exam ?General: The patient is alert and oriented x3 in no acute distress. ? ?Dermatology: Hyperkeratotic, discolored, thickened, onychodystrophy noted to the right great toe and second toe. Skin is warm, dry and supple bilateral lower extremities. Negative for open lesions or macerations.  There is some peeling of the volar surface of the skin left foot with associated pruritus consistent with a tinea pedis ? ?Vascular: Palpable pedal pulses bilaterally. No edema or erythema noted. Capillary refill within normal limits. ? ?Neurological: Epicritic and protective threshold grossly intact bilaterally.  ? ?Musculoskeletal Exam: Range of motion within normal limits to all pedal and ankle joints bilateral. Muscle strength 5/5 in all groups bilateral.  ? ?Assessment: ?#1 Onychomycosis of toenails ?#2 chronic tinea pedis left ? ?Plan of Care:  ?#1 Patient was evaluated. ?#2  Today we discussed different treatment options including oral, topical, and laser antifungal treatment modalities.  We discussed their efficacies and side effects.  Patient opts for oral antifungal treatment modality ?#3 prescription for Lamisil 250 mg #90 daily.  She denies a  history of liver pathology or symptoms.  Patient is otherwise healthy ?#4  Prescription for Lotrisone cream apply 2 times daily  ?#5 Tolcylen antifungal topical dispensed at checkout  ?#6 return to clinic 6 months ? ? ?Edrick Kins, DPM ?[facility] ? ?Dr. Edrick Kins, DPM  ?  ?[address]                                    ?[place], [place] [address]                ?Office [phone]  ?Fax [phone] ? ? ?  ?

## 2021-10-11 ENCOUNTER — Telehealth (HOSPITAL_COMMUNITY): Payer: Self-pay | Admitting: Nurse Practitioner

## 2021-10-11 NOTE — BH Assessment (Signed)
Care Management - Follow Up [facility] Discharges   Writer made contact with the patient. Patient reports that he has a follow up appointment at a mental health facility on October 17, 2021.

## 2021-10-17 ENCOUNTER — Encounter: Payer: Self-pay | Admitting: Nurse Practitioner

## 2021-10-17 ENCOUNTER — Ambulatory Visit (INDEPENDENT_AMBULATORY_CARE_PROVIDER_SITE_OTHER): Payer: 59 | Admitting: Nurse Practitioner

## 2021-10-17 VITALS — BP 118/80 | HR 70 | Temp 99.1°F | Ht 60.0 in | Wt 160.2 lb

## 2021-10-17 DIAGNOSIS — F3341 Major depressive disorder, recurrent, in partial remission: Secondary | ICD-10-CM | POA: Diagnosis not present

## 2021-10-17 DIAGNOSIS — Z23 Encounter for immunization: Secondary | ICD-10-CM | POA: Diagnosis not present

## 2021-10-17 DIAGNOSIS — Z9152 Personal history of nonsuicidal self-harm: Secondary | ICD-10-CM

## 2021-10-17 NOTE — Assessment & Plan Note (Signed)
Improved mood Denies any self mutilation of SI/HI or hallucination in last 30month. Current use of wellbutrin and vistaril She has appt with [facility] today and upcoming appt with psychiatry in same practice.

## 2021-10-17 NOTE — Assessment & Plan Note (Signed)
Last incident 09/2021

## 2021-10-17 NOTE — Patient Instructions (Signed)
Maintain appt with psychiatry and psychology Send photo of rash if it reoccurs.

## 2021-10-17 NOTE — Progress Notes (Signed)
Established Patient Visit  Patient: Rebecca Rocha   DOB: 07/27/2000   21 y.o. Female  MRN: [id number] Visit Date: 10/17/2021  Subjective:    Chief Complaint  Patient presents with   Office Visit    4 week f/u Mood disorder C/o of lupus: ( mom noticed butterfly wings around pt's mouth, joint pain, rashes all over body x 2 weeks)  HPV Given today   HPI Reports intermittent facial rash, unknown cause. She is concerned about possible lupus due to Fhx. She has no rash present at this time. No joint pain, no joint swelling.  History of self mutilation Last incident 09/2021  Depression, recurrent ([place]) Improved mood Denies any self mutilation of SI/HI or hallucination in last 37month. Current use of wellbutrin and vistaril She has appt with [facility] today and upcoming appt with psychiatry in same practice.     10/17/2021    9:37 AM 09/19/2021    9:20 AM 07/07/2021    1:31 PM  Depression screen PHQ 2/9  Decreased Interest 1 3 1   Down, Depressed, Hopeless 1 3 2   PHQ - 2 Score 2 6 3   Altered sleeping 3 3 1   Tired, decreased energy 3 3 1   Change in appetite 1 3 2   Feeling bad or failure about yourself  0 3 0  Trouble concentrating 0 3 0  Moving slowly or fidgety/restless 0 1 0  Suicidal thoughts 0 2 0  PHQ-9 Score 9 24 7   Difficult doing work/chores Somewhat difficult Extremely dIfficult Somewhat difficult       10/17/2021    9:38 AM 09/19/2021    9:20 AM 07/07/2021    1:31 PM 05/31/2021    1:49 PM  GAD 7 : Generalized Anxiety Score  Nervous, Anxious, on Edge 0 3 1 3   Control/stop worrying 0 3 1 2   Worry too much - different things 0 3 1 3   Trouble relaxing 0 3 1 3   Restless 0 3 0 0  Easily annoyed or irritable 2 3 1 3   Afraid - awful might happen 1 3 1  0  Total GAD 7 Score 3 21 6 14   Anxiety Difficulty Somewhat difficult Extremely difficult Somewhat difficult Extremely difficult   Reviewed medical, surgical, and social history  today  Medications: Outpatient Medications Prior to Visit  Medication Sig   albuterol (PROVENTIL) (2.5 MG/3ML) 0.083% nebulizer solution Take 3 mLs (2.5 mg total) by nebulization every 6 (six) hours as needed for wheezing or shortness of breath.   albuterol (VENTOLIN HFA) 108 (90 Base) MCG/ACT inhaler USE ONE TO TWO PUFFS EVERY 6 HOURS AS NEEDED FOR WHEEZING OR SHORTNESS OF BREATH   buPROPion (WELLBUTRIN SR) 150 MG 12 hr tablet Take 1 tablet (150 mg total) by mouth 2 (two) times daily.   clotrimazole-betamethasone (LOTRISONE) cream Apply 1 application. topically 2 (two) times daily.   dicyclomine (BENTYL) 20 MG tablet Take 1 tablet (20 mg total) by mouth 2 (two) times daily.   fluticasone (FLOVENT HFA) 110 MCG/ACT inhaler Inhale 1 puff into the lungs 2 (two) times daily. Rinse mouth after each use   gabapentin (NEURONTIN) 100 MG capsule TAKE 1 CAPSULE AT BEDTIME X1 WEEK THEN 1 CAPSULE TWICE DAILY X2 WEEKS THE 1 CAPSULE EVERY MORNING AND 2 CAPSULES AT BEDTIME ORALLY   hydrOXYzine (VISTARIL) 25 MG capsule Take 1 capsule (25 mg total) by mouth 3 (three) times daily as needed.   hyoscyamine (  ANASPAZ) 0.125 MG TBDP disintergrating tablet Place 1 tablet (0.125 mg total) under the tongue every 6 (six) hours as needed for cramping.   ondansetron (ZOFRAN) 4 MG tablet Take 1 tablet (4 mg total) by mouth every 6 (six) hours as needed for nausea or vomiting.   pantoprazole (PROTONIX) 40 MG tablet Take 1 tablet (40 mg total) by mouth daily.   phenazopyridine (PYRIDIUM) 95 MG tablet Take by mouth.   terbinafine (LAMISIL) 250 MG tablet Take 1 tablet (250 mg total) by mouth daily.   etonogestrel (NEXPLANON) 68 MG IMPL implant 1 each by Subdermal route once. (Patient not taking: Reported on 10/17/2021)   No facility-administered medications prior to visit.   Reviewed past medical and social history.   ROS per HPI above      Objective:  BP 118/80 (BP Location: Right Arm, Patient Position: Sitting, Cuff  Size: Normal)   Pulse 70   Temp 99.1 F (37.3 C) (Temporal)   Ht 5' (1.524 m)   Wt 160 lb 3.2 oz (72.7 kg)   SpO2 97%   BMI 31.29 kg/m      Physical Exam Skin:    General: Skin is warm and dry.     Findings: No erythema, lesion or rash.  Neurological:     Mental Status: She is alert and oriented to person, place, and time.  Psychiatric:        Mood and Affect: Mood normal.        Behavior: Behavior normal.        Thought Content: Thought content normal.    No results found for any visits on 10/17/21.    Assessment & Plan:    Problem List Items Addressed This Visit       Other   Depression, recurrent ([place]) - Primary    Improved mood Denies any self mutilation of SI/HI or hallucination in last 55month. Current use of wellbutrin and vistaril She has appt with [facility] today and upcoming appt with psychiatry in same practice.       History of self mutilation    Last incident 09/2021       Other Visit Diagnoses     Need for HPV vaccination       Relevant Orders   HPV 9-valent vaccine,Recombinat (Completed)     Send photo of rash if it reoccurs.  Return in about 8 months (around 06/19/2022) for CPE (fasting).     Wilfred Lacy, NP

## 2021-10-18 ENCOUNTER — Ambulatory Visit: Payer: 59 | Admitting: Obstetrics & Gynecology

## 2021-10-28 ENCOUNTER — Ambulatory Visit: Payer: 59

## 2021-11-10 ENCOUNTER — Encounter: Payer: Self-pay | Admitting: Radiology

## 2021-11-10 ENCOUNTER — Ambulatory Visit (INDEPENDENT_AMBULATORY_CARE_PROVIDER_SITE_OTHER): Payer: 59 | Admitting: Radiology

## 2021-11-10 ENCOUNTER — Ambulatory Visit: Payer: 59

## 2021-11-10 VITALS — BP 100/62 | Ht 60.5 in | Wt 159.0 lb

## 2021-11-10 DIAGNOSIS — Z3042 Encounter for surveillance of injectable contraceptive: Secondary | ICD-10-CM

## 2021-11-10 DIAGNOSIS — Z01419 Encounter for gynecological examination (general) (routine) without abnormal findings: Secondary | ICD-10-CM

## 2021-11-10 DIAGNOSIS — Z113 Encounter for screening for infections with a predominantly sexual mode of transmission: Secondary | ICD-10-CM | POA: Diagnosis not present

## 2021-11-10 MED ORDER — MEDROXYPROGESTERONE ACETATE 150 MG/ML IM SUSP
150.0000 mg | Freq: Once | INTRAMUSCULAR | Status: AC
  Administered 2021-11-10: 150 mg via INTRAMUSCULAR

## 2021-11-10 NOTE — Progress Notes (Signed)
   Rebecca Rocha 09-Dec-2000 [id number]   History:  21 y.o. G0 presents for annual exam. Would like STI screen. Received depo today. Considering stopping late next  year to conceive.  Gynecologic History No LMP recorded. Patient has had an injection.   Contraception/Family planning: Depo-Provera injections Sexually active: yes Last Pap: 2022. Results were: normal   Obstetric History OB History  Gravida Para Term Preterm AB Living  0 0 0 0 0 0  SAB IAB Ectopic Multiple Live Births  0 0 0 0 0     The following portions of the patient's history were reviewed and updated as appropriate: allergies, current medications, past family history, past medical history, past social history, past surgical history, and problem list.  Review of Systems Pertinent items noted in HPI and remainder of comprehensive ROS otherwise negative.   Past medical history, past surgical history, family history and social history were all reviewed and documented in the EPIC chart.   Exam:  Vitals:   11/10/21 0843  BP: 100/62  Weight: 159 lb (72.1 kg)  Height: 5' 0.5" (1.537 m)   Body mass index is 30.54 kg/m.  General appearance:  Normal Thyroid:  Symmetrical, normal in size, without palpable masses or nodularity. Respiratory  Auscultation:  Clear without wheezing or rhonchi Cardiovascular  Auscultation:  Regular rate, without rubs, murmurs or gallops  Edema/varicosities:  Not grossly evident Abdominal  Soft,nontender, without masses, guarding or rebound.  Liver/spleen:  No organomegaly noted  Hernia:  None appreciated  Skin  Inspection:  Grossly normal Breasts: Examined lying and sitting.   Right: Without masses, retractions, nipple discharge or axillary adenopathy.   Left: Without masses, retractions, nipple discharge or axillary adenopathy. Genitourinary   Inguinal/mons:  Normal without inguinal adenopathy  External genitalia:  Normal appearing vulva with no masses, tenderness,  or lesions  BUS/Urethra/Skene's glands:  Normal without masses or exudate  Vagina:  Normal appearing with normal color and discharge, no lesions  Cervix:  Normal appearing without discharge or lesions  Uterus:  Normal in size, shape and contour.  Mobile, nontender  Adnexa/parametria:     Rt: Normal in size, without masses or tenderness.   Lt: Normal in size, without masses or tenderness.  Anus and perineum: Normal   Patient informed chaperone available to be present for breast and pelvic exam. Patient has requested no chaperone to be present. Patient has been advised what will be completed during breast and pelvic exam.   Assessment/Plan:   1. Well woman exam with routine gynecological exam Pap due 2025  2. Surveillance for Depo-Provera contraception Next injection due 9/14 -9/28 - medroxyPROGESTERone (DEPO-PROVERA) injection 150 mg  3. Screening for STDs (sexually transmitted diseases)  - SURESWAB CT/NG/T. vaginalis      Discussed SBE, pap and STI screening as directed/appropriate. Recommend of exercise weekly, including weight bearing exercise. Encouraged the use of seatbelts and sunscreen. Return in 1 year for annual or as needed.   [name] B WHNP-BC 8:58 AM 11/10/2021

## 2021-11-12 LAB — SURESWAB CT/NG/T. VAGINALIS
C. trachomatis RNA, TMA: NOT DETECTED
N. gonorrhoeae RNA, TMA: NOT DETECTED
Trichomonas vaginalis RNA: NOT DETECTED

## 2021-11-24 ENCOUNTER — Encounter: Payer: Self-pay | Admitting: Family Medicine

## 2021-11-24 ENCOUNTER — Ambulatory Visit (INDEPENDENT_AMBULATORY_CARE_PROVIDER_SITE_OTHER): Payer: 59 | Admitting: Radiology

## 2021-11-24 ENCOUNTER — Ambulatory Visit (INDEPENDENT_AMBULATORY_CARE_PROVIDER_SITE_OTHER): Payer: 59 | Admitting: Family Medicine

## 2021-11-24 VITALS — BP 126/84 | HR 73 | Temp 97.0°F | Wt 157.0 lb

## 2021-11-24 VITALS — BP 116/82

## 2021-11-24 DIAGNOSIS — N907 Vulvar cyst: Secondary | ICD-10-CM | POA: Diagnosis not present

## 2021-11-24 DIAGNOSIS — K130 Diseases of lips: Secondary | ICD-10-CM | POA: Diagnosis not present

## 2021-11-24 HISTORY — DX: Diseases of lips: K13.0

## 2021-11-24 MED ORDER — VALACYCLOVIR HCL 500 MG PO TABS: 500.0000 mg | ORAL_TABLET | Freq: Two times a day (BID) | ORAL | 0 refills | Status: AC

## 2021-11-24 NOTE — Assessment & Plan Note (Signed)
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

## 2021-11-24 NOTE — Progress Notes (Signed)
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 female who has Allergy to environmental factors; Asthma; Gigantomastia; Chronic upper back pain; S/P laparoscopic cholecystectomy; Chronic tension-type headache, not intractable; Insomnia; Hepatic lesion; Depression, recurrent ([place]); Breast mass, right; Chronic myofascial pain; Nausea without vomiting; Generalized abdominal pain; Genetic testing; History of self mutilation; and Sore of lip on their problem list..   She  has a past medical history of Acute calculous cholecystitis (11/26/2019), Allergy, Anxiety, Asthma, Depression, Menstrual migraine, intractable, without status migrainosus, Panic attack, Sinusitis, Sleep apnea, Syncope and collapse, and Vertigo of central origin, unspecified ear..   She presents with chief complaint of Bump on upper lip (Painful Bumps on upper and lower lip x 3 days ago) .   Patient presents with 3 days of bumps on her lips.  These are painful.  She has multiple bumps and they are getting worse.  She has tried applying Neosporin to her lips but without improvement.  She does have a history of HSV, but typically she breaks out in the corners of her mouth.  This current episode is different from what has been in the past.  Patient denies any lesions inside the mouth.  Past Surgical  History:  Procedure Laterality Date   CHOLECYSTECTOMY N/A 11/26/2019   Procedure: LAPAROSCOPIC CHOLECYSTECTOMY;  Surgeon: Kinsinger, Arta Bruce, MD;  Location: [facility];  Service: General;  Laterality: N/A;    Outpatient Medications Prior to Visit  Medication Sig Dispense Refill   albuterol (PROVENTIL) (2.5 MG/3ML) 0.083% nebulizer solution Take 3 mLs (2.5 mg total) by nebulization every 6 (six) hours as needed for wheezing or shortness of breath. 100 mL 0   albuterol (VENTOLIN HFA) 108 (90 Base) MCG/ACT inhaler USE ONE TO TWO PUFFS EVERY 6 HOURS AS NEEDED FOR WHEEZING OR SHORTNESS OF BREATH 8.5 g 2   buPROPion (WELLBUTRIN SR) 150 MG 12 hr tablet Take 1 tablet (150 mg total) by mouth 2 (two) times daily. 180 tablet 3   clotrimazole-betamethasone (LOTRISONE) cream Apply 1 application. topically 2 (two) times daily. 45 g 1   dicyclomine (BENTYL) 20 MG tablet Take 1 tablet (20 mg total) by mouth 2 (two) times daily. 60 tablet 2   fluticasone (FLOVENT HFA) 110 MCG/ACT inhaler Inhale 1 puff into the lungs 2 (two) times daily. Rinse mouth after each use 1 each 5   gabapentin (NEURONTIN) 100 MG capsule TAKE 1 CAPSULE AT BEDTIME X1 WEEK THEN 1 CAPSULE TWICE DAILY X2 WEEKS THE 1 CAPSULE EVERY MORNING AND 2 CAPSULES AT BEDTIME ORALLY 90 capsule 3   hydrOXYzine (VISTARIL) 25 MG capsule Take 1 capsule (25 mg total) by mouth 3 (three) times daily as needed. 30 capsule 0   hyoscyamine (ANASPAZ) 0.125 MG TBDP disintergrating tablet Place 1 tablet (0.125 mg total) under the tongue every 6 (six) hours as needed for  cramping. 90 tablet 2   medroxyPROGESTERone (DEPO-PROVERA) 150 MG/ML injection Inject 150 mg into the muscle every 3 (three) months.     ondansetron (ZOFRAN) 4 MG tablet Take 1 tablet (4 mg total) by mouth every 6 (six) hours as needed for nausea or vomiting. 20 tablet 0   pantoprazole (PROTONIX) 40 MG tablet Take 1 tablet (40 mg total) by mouth daily. 30 tablet 3   phenazopyridine (PYRIDIUM) 95 MG  tablet Take by mouth.     terbinafine (LAMISIL) 250 MG tablet Take 1 tablet (250 mg total) by mouth daily. 90 tablet 0   No facility-administered medications prior to visit.    Family History  Problem Relation Age of Onset   Hypertension Mother    Liver disease Mother    Anxiety disorder Mother    Kidney disease Mother    Other Mother        Negative hereditary cancer genetic testing   Hypertension Father    Diabetes Father    Anxiety disorder Father    Anxiety disorder Sister    Depression Sister    Breast cancer Maternal Grandmother 68   Diabetes Paternal Grandmother    Hypertension Paternal Grandmother    High Cholesterol Paternal Grandmother    Cancer Paternal Grandfather    Diabetes Paternal Grandfather    Hypertension Paternal Grandfather    High Cholesterol Paternal Grandfather    Other Cousin        maternal second cousin, BRCA2+   Esophageal cancer Neg Hx    Rectal cancer Neg Hx    Stomach cancer Neg Hx     Social History   Socioeconomic History   Marital status: Married    Spouse name: Not on file   Number of children: 0   Years of education: Not on file   Highest education level: Not on file  Occupational History   Not on file  Tobacco Use   Smoking status: Never   Smokeless tobacco: Never  Vaping Use   Vaping Use: Never used  Substance and Sexual Activity   Alcohol use: Yes    Comment: occasionally   Drug use: No   Sexual activity: Yes    Partners: Male    Birth control/protection: Injection    Comment: 1st intercourse- 15, partners- 5, current partnER- 4 YRS  Other Topics Concern   Not on file  Social History Narrative   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 Awake and conversant.  Eyes: Normal conjunctiva, anicteric. Round symmetric pupils.  ENT: Hearing grossly intact. No nasal discharge.  There are multiple papules across the upper and lower lip.  There are approximately 1 to 2 mm in diameter.  There is no drainage.  MMM, no oropharyngeal lesions, mobile tongue Neck: Neck is supple. No masses or thyromegaly.  Respiratory: Respirations are non-labored. No auditory wheezing.  Skin: Warm. No rashes or ulcers.  Psych: Alert and oriented. Cooperative, Appropriate mood and affect, Normal judgment.  CV: No cyanosis or JVD MSK: Normal ambulation. No clubbing  Neuro: Sensation and CN II-XII grossly normal.        Alesia Banda, MD, MS

## 2021-11-24 NOTE — Progress Notes (Signed)
Subjective: Rebecca Rocha is a 21 y.o. female who complains of a left labial bump x 3 days. Has decreased in size. No drainage, is tender to touch. No redness or swelling. No other associated symptoms.   Review of Systems  All other systems reviewed and are negative.  Current Outpatient Medications on File Prior to Visit  Medication Sig Dispense Refill   albuterol (PROVENTIL) (2.5 MG/3ML) 0.083% nebulizer solution Take 3 mLs (2.5 mg total) by nebulization every 6 (six) hours as needed for wheezing or shortness of breath. 100 mL 0   albuterol (VENTOLIN HFA) 108 (90 Base) MCG/ACT inhaler USE ONE TO TWO PUFFS EVERY 6 HOURS AS NEEDED FOR WHEEZING OR SHORTNESS OF BREATH 8.5 g 2   buPROPion (WELLBUTRIN SR) 150 MG 12 hr tablet Take 1 tablet (150 mg total) by mouth 2 (two) times daily. 180 tablet 3   clotrimazole-betamethasone (LOTRISONE) cream Apply 1 application. topically 2 (two) times daily. 45 g 1   dicyclomine (BENTYL) 20 MG tablet Take 1 tablet (20 mg total) by mouth 2 (two) times daily. 60 tablet 2   fluticasone (FLOVENT HFA) 110 MCG/ACT inhaler Inhale 1 puff into the lungs 2 (two) times daily. Rinse mouth after each use 1 each 5   gabapentin (NEURONTIN) 100 MG capsule TAKE 1 CAPSULE AT BEDTIME X1 WEEK THEN 1 CAPSULE TWICE DAILY X2 WEEKS THE 1 CAPSULE EVERY MORNING AND 2 CAPSULES AT BEDTIME ORALLY 90 capsule 3   hydrOXYzine (VISTARIL) 25 MG capsule Take 1 capsule (25 mg total) by mouth 3 (three) times daily as needed. 30 capsule 0   hyoscyamine (ANASPAZ) 0.125 MG TBDP disintergrating tablet Place 1 tablet (0.125 mg total) under the tongue every 6 (six) hours as needed for cramping. 90 tablet 2   medroxyPROGESTERone (DEPO-PROVERA) 150 MG/ML injection Inject 150 mg into the muscle every 3 (three) months.     ondansetron (ZOFRAN) 4 MG tablet Take 1 tablet (4 mg total) by mouth every 6 (six) hours as needed for nausea or vomiting. 20 tablet 0   pantoprazole (PROTONIX) 40 MG  tablet Take 1 tablet (40 mg total) by mouth daily. 30 tablet 3   phenazopyridine (PYRIDIUM) 95 MG tablet Take by mouth.     terbinafine (LAMISIL) 250 MG tablet Take 1 tablet (250 mg total) by mouth daily. 90 tablet 0   valACYclovir (VALTREX) 500 MG tablet Take 1 tablet (500 mg total) by mouth 2 (two) times daily for 4 days. Take for 3 days 6 tablet 0   No current facility-administered medications on file prior to visit.      Objective: Physical Exam Constitutional:      Appearance: Normal appearance. She is normal weight.  HENT:     Head: Atraumatic.  Abdominal:     General: Abdomen is flat.     Palpations: Abdomen is soft.  Neurological:     Mental Status: She is alert and oriented to person, place, and time. Mental status is at baseline.  Psychiatric:        Mood and Affect: Mood normal.        Thought Content: Thought content normal.        Judgment: Judgment normal.     -Vulva: 47mm sebaceous cyst present on the left labia majora, no redness, swelling or drainage -Vagina/Cervix: without lesions or discharge -Perineum: no lesions  Chaperone offered and declined.  Assessment:/Plan:  1. Sebaceous cyst of labia Warm compresses, monitor for signs of infection Discussed prevention  Avoid intercourse until symptoms are resolved. Safe sex encouraged. Avoid the use of soaps or perfumed products in the peri area. Avoid tub baths and sitting in sweaty or wet clothing for prolonged periods of time.

## 2021-12-01 NOTE — Telephone Encounter (Signed)
Ok to schedule for next visit for teaching, due 9/14.

## 2021-12-01 NOTE — Telephone Encounter (Addendum)
Last AEX with Asher Muir, NP 11/10/2021  Routing to Jami to advise on Depo-SubQ Provera.

## 2021-12-01 NOTE — Telephone Encounter (Signed)
I am comfortable with her administering at home, is the practice typically against it?

## 2021-12-06 ENCOUNTER — Other Ambulatory Visit: Payer: Self-pay | Admitting: Nurse Practitioner

## 2021-12-06 DIAGNOSIS — J4520 Mild intermittent asthma, uncomplicated: Secondary | ICD-10-CM

## 2021-12-06 NOTE — Telephone Encounter (Signed)
Chart supports Rx Last OV: 11/2021 Next OV: 12/2021  

## 2021-12-16 ENCOUNTER — Other Ambulatory Visit: Payer: Self-pay

## 2021-12-16 ENCOUNTER — Encounter (HOSPITAL_BASED_OUTPATIENT_CLINIC_OR_DEPARTMENT_OTHER): Payer: Self-pay | Admitting: Emergency Medicine

## 2021-12-16 DIAGNOSIS — G43409 Hemiplegic migraine, not intractable, without status migrainosus: Secondary | ICD-10-CM | POA: Diagnosis not present

## 2021-12-16 DIAGNOSIS — R300 Dysuria: Secondary | ICD-10-CM | POA: Insufficient documentation

## 2021-12-16 DIAGNOSIS — Z7951 Long term (current) use of inhaled steroids: Secondary | ICD-10-CM | POA: Insufficient documentation

## 2021-12-16 DIAGNOSIS — J45909 Unspecified asthma, uncomplicated: Secondary | ICD-10-CM | POA: Insufficient documentation

## 2021-12-16 DIAGNOSIS — M545 Low back pain, unspecified: Secondary | ICD-10-CM | POA: Insufficient documentation

## 2021-12-16 NOTE — ED Triage Notes (Addendum)
Pt states she is having severe lower back pain starting 4 days ago.  Pt states the pain has radiated up her spine and is giving her a migraine as well as chest pain.  Pt also states she is unable to void but does have sensation in her perineum.

## 2021-12-17 ENCOUNTER — Emergency Department (HOSPITAL_BASED_OUTPATIENT_CLINIC_OR_DEPARTMENT_OTHER)
Admission: EM | Admit: 2021-12-17 | Discharge: 2021-12-17 | Disposition: A | Discharge status: Home / Self Care | Payer: 59 | Attending: Emergency Medicine | Admitting: Emergency Medicine

## 2021-12-17 ENCOUNTER — Encounter (HOSPITAL_BASED_OUTPATIENT_CLINIC_OR_DEPARTMENT_OTHER): Payer: Self-pay | Admitting: Emergency Medicine

## 2021-12-17 DIAGNOSIS — R300 Dysuria: Secondary | ICD-10-CM

## 2021-12-17 DIAGNOSIS — M545 Low back pain, unspecified: Secondary | ICD-10-CM

## 2021-12-17 DIAGNOSIS — G43409 Hemiplegic migraine, not intractable, without status migrainosus: Secondary | ICD-10-CM

## 2021-12-17 HISTORY — DX: Other chronic pain: G89.29

## 2021-12-17 HISTORY — DX: Menstrual migraine, not intractable, without status migrainosus: G43.829

## 2021-12-17 HISTORY — DX: Headache, unspecified: R51.9

## 2021-12-17 LAB — BASIC METABOLIC PANEL
Anion gap: 6 (ref 5–15)
BUN: 7 mg/dL (ref 6–20)
CO2: 25 mmol/L (ref 22–32)
Calcium: 8.7 mg/dL — ABNORMAL LOW (ref 8.9–10.3)
Chloride: 105 mmol/L (ref 98–111)
Creatinine, Ser: 0.8 mg/dL (ref 0.44–1.00)
GFR, Estimated: >60 mL/min (ref >60)
Glucose, Bld: 97 mg/dL (ref 70–99)
Potassium: 3.3 mmol/L — ABNORMAL LOW (ref 3.5–5.1)
Sodium: 136 mmol/L (ref 135–145)

## 2021-12-17 LAB — URINALYSIS, ROUTINE W REFLEX MICROSCOPIC
Bilirubin Urine: NEGATIVE
Glucose, UA: NEGATIVE mg/dL
Hgb urine dipstick: NEGATIVE
Ketones, ur: NEGATIVE mg/dL
Leukocytes,Ua: NEGATIVE
Nitrite: NEGATIVE
Protein, ur: NEGATIVE mg/dL
Specific Gravity, Urine: 1.01 (ref 1.005–1.030)
pH: 6.5 (ref 5.0–8.0)

## 2021-12-17 LAB — CBC WITH DIFFERENTIAL/PLATELET
Abs Immature Granulocytes: 0.02 10*3/uL (ref 0.00–0.07)
Basophils Absolute: 0 10*3/uL (ref 0.0–0.1)
Basophils Relative: 1 %
Eosinophils Absolute: 0 10*3/uL (ref 0.0–0.5)
Eosinophils Relative: 0 %
HCT: 39.8 % (ref 36.0–46.0)
Hemoglobin: 12.8 g/dL (ref 12.0–15.0)
Immature Granulocytes: 0 %
Lymphocytes Relative: 33 %
Lymphs Abs: 2 10*3/uL (ref 0.7–4.0)
MCH: 30 pg (ref 26.0–34.0)
MCHC: 32.2 g/dL (ref 30.0–36.0)
MCV: 93.2 fL (ref 80.0–100.0)
Monocytes Absolute: 0.6 10*3/uL (ref 0.1–1.0)
Monocytes Relative: 10 %
Neutro Abs: 3.4 10*3/uL (ref 1.7–7.7)
Neutrophils Relative %: 56 %
Platelets: 332 10*3/uL (ref 150–400)
RBC: 4.27 MIL/uL (ref 3.87–5.11)
RDW: 11.9 % (ref 11.5–15.5)
WBC: 6.1 10*3/uL (ref 4.0–10.5)
nRBC: 0 % (ref 0.0–0.2)

## 2021-12-17 LAB — HCG, SERUM, QUALITATIVE: Preg, Serum: NEGATIVE

## 2021-12-17 MED ORDER — SODIUM CHLORIDE 0.9 % IV BOLUS
1000.0000 mL | Freq: Once | INTRAVENOUS | Status: AC
Start: 2021-12-17 — End: 2021-12-17
  Administered 2021-12-17: 1000 mL via INTRAVENOUS

## 2021-12-17 MED ORDER — DIPHENHYDRAMINE HCL 50 MG/ML IJ SOLN
25.0000 mg | Freq: Once | INTRAMUSCULAR | Status: AC
  Administered 2021-12-17: 25 mg via INTRAVENOUS
  Filled 2021-12-17: qty 1

## 2021-12-17 MED ORDER — HYDROCODONE-ACETAMINOPHEN 5-325 MG PO TABS
1.0000 | ORAL_TABLET | Freq: Four times a day (QID) | ORAL | 0 refills | Status: DC | PRN
Start: 2021-12-17 — End: 2022-01-20

## 2021-12-17 MED ORDER — METOCLOPRAMIDE HCL 5 MG/ML IJ SOLN
10.0000 mg | Freq: Once | INTRAMUSCULAR | Status: AC
  Administered 2021-12-17: 10 mg via INTRAVENOUS
  Filled 2021-12-17: qty 2

## 2021-12-17 MED ORDER — KETOROLAC TROMETHAMINE 15 MG/ML IJ SOLN
15.0000 mg | Freq: Once | INTRAMUSCULAR | Status: AC
  Administered 2021-12-17: 15 mg via INTRAVENOUS
  Filled 2021-12-17: qty 1

## 2021-12-17 NOTE — ED Provider Notes (Signed)
[facility] Provider Note: Rebecca Dell, MD, FACEP  CSN: [id number] MRN: [id number] ARRIVAL: 12/16/21 at 2236 ROOM: MH05/[facility]05   CHIEF COMPLAINT  Back Pain   HISTORY OF PRESENT ILLNESS  12/17/21 1:03 AM Rebecca Rocha is a 21 y.o. female with 4 days of back pain.  The back pain is located in the bilateral paralumbar region radiating around her sides bilaterally.  She describes it as a pinching sensation.  She rates it as a 10 out of 10 and it is worse with movement.  She states the pain is caused her to get a migraine (she has a history of migraines) which is bifrontal.  She has had associated nausea and photophobia.  She is having no numbness (including no saddle anesthesia) or weakness currently but states that she is not able to urinate.  She has not voided her bladder since this morning but does not feel her bladder is distended.   Past Medical History:  Diagnosis Date   Acute calculous cholecystitis 11/26/2019   Allergy    Anxiety    Asthma    Chronic headache disorder    Chronic upper back pain    Depression    Menstrual migraine    Panic attack    Sinusitis    Sleep apnea    Syncope and collapse    Vertigo of central origin, unspecified ear     Past Surgical History:  Procedure Laterality Date   CHOLECYSTECTOMY N/A 11/26/2019   Procedure: LAPAROSCOPIC CHOLECYSTECTOMY;  Surgeon: Kinsinger, De Blanch, MD;  Location: [facility];  Service: General;  Laterality: N/A;    Family History  Problem Relation Age of Onset   Hypertension Mother    Liver disease Mother    Anxiety disorder Mother    Kidney disease Mother    Other Mother        Negative hereditary cancer genetic testing   Hypertension Father    Diabetes Father    Anxiety disorder Father    Anxiety disorder Sister    Depression Sister    Breast cancer Maternal Grandmother 16   Diabetes Paternal Grandmother    Hypertension Paternal Grandmother    High Cholesterol Paternal Grandmother     Cancer Paternal Grandfather    Diabetes Paternal Grandfather    Hypertension Paternal Grandfather    High Cholesterol Paternal Grandfather    Other Cousin        maternal second cousin, BRCA2+   Esophageal cancer Neg Hx    Rectal cancer Neg Hx    Stomach cancer Neg Hx     Social History   Tobacco Use   Smoking status: Never   Smokeless tobacco: Never  Vaping Use   Vaping Use: Never used  Substance Use Topics   Alcohol use: Yes    Comment: occasionally   Drug use: No    Prior to Admission medications   Medication Sig Start Date End Date Taking? Authorizing Provider  HYDROcodone-acetaminophen (NORCO) 5-325 MG tablet Take 1 tablet by mouth every 6 (six) hours as needed for severe pain. 12/17/21  Yes Izel Hochberg, MD  albuterol (PROVENTIL) (2.5 MG/3ML) 0.083% nebulizer solution Take 3 mLs (2.5 mg total) by nebulization every 6 (six) hours as needed for wheezing or shortness of breath. 11/05/20   Nche, Bonna Gains, NP  albuterol (VENTOLIN HFA) 108 (90 Base) MCG/ACT inhaler INHALE 1 TO 2 PUFFS BY MOUTH EVERY 6 HOURS AS NEEDED FOR WHEEZING OR SHORTNESS OF BREATH 12/06/21   Nche, Bonna Gains, NP  buPROPion (WELLBUTRIN SR) 150 MG 12 hr tablet Take 1 tablet (150 mg total) by mouth 2 (two) times daily. 07/07/21   Nche, Bonna Gains, NP  clotrimazole-betamethasone (LOTRISONE) cream Apply 1 application. topically 2 (two) times daily. 09/26/21   Felecia Shelling, DPM  dicyclomine (BENTYL) 20 MG tablet Take 1 tablet (20 mg total) by mouth 2 (two) times daily. 07/07/21   Zehr, Shanda Bumps D, PA-C  fluticasone (FLOVENT HFA) 110 MCG/ACT inhaler Inhale 1 puff into the lungs 2 (two) times daily. Rinse mouth after each use 07/07/21   Nche, Bonna Gains, NP  gabapentin (NEURONTIN) 100 MG capsule TAKE 1 CAPSULE AT BEDTIME X1 WEEK THEN 1 CAPSULE TWICE DAILY X2 WEEKS THE 1 CAPSULE EVERY MORNING AND 2 CAPSULES AT BEDTIME ORALLY 09/21/21   Nche, Bonna Gains, NP  hydrOXYzine (VISTARIL) 25 MG capsule Take 1 capsule  (25 mg total) by mouth 3 (three) times daily as needed. 09/19/21   Oneta Rack, NP  hyoscyamine (ANASPAZ) 0.125 MG TBDP disintergrating tablet Place 1 tablet (0.125 mg total) under the tongue every 6 (six) hours as needed for cramping. 04/20/20   Nche, Bonna Gains, NP  medroxyPROGESTERone (DEPO-PROVERA) 150 MG/ML injection Inject 150 mg into the muscle every 3 (three) months.    [provider]  ondansetron (ZOFRAN) 4 MG tablet Take 1 tablet (4 mg total) by mouth every 6 (six) hours as needed for nausea or vomiting. 05/31/21   Gilda Crease, MD  pantoprazole (PROTONIX) 40 MG tablet Take 1 tablet (40 mg total) by mouth daily. 05/31/21   Gilda Crease, MD  phenazopyridine (PYRIDIUM) 95 MG tablet Take by mouth. 01/19/21   [provider]  terbinafine (LAMISIL) 250 MG tablet Take 1 tablet (250 mg total) by mouth daily. 09/26/21   Felecia Shelling, DPM    Allergies Patient has no known allergies.   REVIEW OF SYSTEMS  Negative except as noted here or in the History of Present Illness.   PHYSICAL EXAMINATION  Initial Vital Signs Blood pressure 111/70, pulse 89, temperature 98.3 F (36.8 C), temperature source Oral, resp. rate 20, height 5' (1.524 m), weight 65.8 kg, SpO2 99 %.  Examination General: Well-developed, well-nourished female in no acute distress; appearance consistent with age of record HENT: normocephalic; atraumatic Eyes: pupils equal, round and reactive to light; extraocular muscles intact Neck: supple Heart: regular rate and rhythm; no murmurs, rubs or gallops Lungs: clear to auscultation bilaterally Abdomen: soft; nondistended; nontender; bowel sounds present; bladder nondistended and bedside bladder scan shows only about 12 mL retained urine Back: Pain on movement of lower back Extremities: No deformity; full range of motion; pulses normal Neurologic: Awake, alert and oriented; motor function intact in all extremities and symmetric; sensation  intact in lower extremities and symmetric; no facial droop; no saddle anesthesia Skin: Warm and dry Psychiatric: Normal mood and affect   RESULTS  Summary of this visit's results, reviewed and interpreted by myself:   EKG Interpretation  Date/Time:    Ventricular Rate:    PR Interval:    QRS Duration:   QT Interval:    QTC Calculation:   R Axis:     Text Interpretation:         Laboratory Studies: Results for orders placed or performed during the hospital encounter of 12/17/21 (from the past 24 hour(s))  Urinalysis, Routine w reflex microscopic Urine, Clean Catch     Status: None   Collection Time: 12/17/21 12:21 AM  Result Value Ref Range  Color, Urine YELLOW YELLOW   APPearance CLEAR CLEAR   Specific Gravity, Urine 1.010 1.005 - 1.030   pH 6.5 5.0 - 8.0   Glucose, UA NEGATIVE NEGATIVE mg/dL   Hgb urine dipstick NEGATIVE NEGATIVE   Bilirubin Urine NEGATIVE NEGATIVE   Ketones, ur NEGATIVE NEGATIVE mg/dL   Protein, ur NEGATIVE NEGATIVE mg/dL   Nitrite NEGATIVE NEGATIVE   Leukocytes,Ua NEGATIVE NEGATIVE  CBC with Differential     Status: None   Collection Time: 12/17/21 12:21 AM  Result Value Ref Range   WBC 6.1 4.0 - 10.5 K/uL   RBC 4.27 3.87 - 5.11 MIL/uL   Hemoglobin 12.8 12.0 - 15.0 g/dL   HCT 39.8 36.0 - 46.0 %   MCV 93.2 80.0 - 100.0 fL   MCH 30.0 26.0 - 34.0 pg   MCHC 32.2 30.0 - 36.0 g/dL   RDW 11.9 11.5 - 15.5 %   Platelets 332 150 - 400 K/uL   nRBC 0.0 0.0 - 0.2 %   Neutrophils Relative % 56 %   Neutro Abs 3.4 1.7 - 7.7 K/uL   Lymphocytes Relative 33 %   Lymphs Abs 2.0 0.7 - 4.0 K/uL   Monocytes Relative 10 %   Monocytes Absolute 0.6 0.1 - 1.0 K/uL   Eosinophils Relative 0 %   Eosinophils Absolute 0.0 0.0 - 0.5 K/uL   Basophils Relative 1 %   Basophils Absolute 0.0 0.0 - 0.1 K/uL   Immature Granulocytes 0 %   Abs Immature Granulocytes 0.02 0.00 - 0.07 K/uL  Basic metabolic panel     Status: Abnormal   Collection Time: 12/17/21 12:21 AM  Result  Value Ref Range   Sodium 136 135 - 145 mmol/L   Potassium 3.3 (L) 3.5 - 5.1 mmol/L   Chloride 105 98 - 111 mmol/L   CO2 25 22 - 32 mmol/L   Glucose, Bld 97 70 - 99 mg/dL   BUN 7 6 - 20 mg/dL   Creatinine, Ser 0.80 0.44 - 1.00 mg/dL   Calcium 8.7 (L) 8.9 - 10.3 mg/dL   GFR, Estimated >60 >60 mL/min   Anion gap 6 5 - 15  hCG, serum, qualitative     Status: None   Collection Time: 12/17/21 12:26 AM  Result Value Ref Range   Preg, Serum NEGATIVE NEGATIVE   Imaging Studies: No results found.  ED COURSE and MDM  Nursing notes, initial and subsequent vitals signs, including pulse oximetry, reviewed and interpreted by myself.  Vitals:   12/16/21 2242 12/16/21 2245 12/17/21 0045 12/17/21 0130  BP: (!) 133/94  111/70 111/74  Pulse: (!) 101  89 81  Resp: $Remo'20  20 19  'jQIuu$ Temp: 98.3 F (36.8 C)     TempSrc: Oral     SpO2: 99%  99% 98%  Weight:  65.8 kg    Height:  5' (1.524 m)     Medications  sodium chloride 0.9 % bolus 1,000 mL (1,000 mLs Intravenous New Bag/Given 12/17/21 0200)  diphenhydrAMINE (BENADRYL) injection 25 mg (25 mg Intravenous Given 12/17/21 0200)  metoCLOPramide (REGLAN) injection 10 mg (10 mg Intravenous Given 12/17/21 0203)  ketorolac (TORADOL) 15 MG/ML injection 15 mg (15 mg Intravenous Given 12/17/21 0202)    1:14 AM The patient's bladder findings are not consistent with inability to void.  If she had not voided since earlier today and, as she states, been drinking fluids all day I would expect her bladder to be significantly distended with urine.  Her bladder on bedside scan  is essentially empty.  Her creatinine is 0.8 with a BUN of 7 which are not consistent with kidney failure.  We will treat her for migraine with IV fluids and medications and have her attempt to void after this.  2:28 AM Patient able to urinate after IV fluid bolus.  She is having some burning with urination but her urinalysis is not consistent with a urinary tract infection.  The cause of her dysuria is  unclear.  I do not believe the patient has cauda equina syndrome as she has no saddle anesthesia and was able to void.  She is having no numbness or weakness in her lower extremities.  Her headache has improved with IV fluid medications and she states she is ready to go home.  We will also treat her for acute back pain.  PROCEDURES  Procedures   ED DIAGNOSES     ICD-10-CM   1. Acute bilateral low back pain without sciatica  M54.50     2. Sporadic migraine  G43.409     3. Dysuria  R30.0          Zoeann Mol, Jenny Reichmann, MD 12/17/21 [phone]

## 2021-12-17 NOTE — ED Notes (Signed)
Pt attempted to give urine sample, sample was given but only a few droplets. Will try again to ask when pt is back to exam room.

## 2021-12-19 LAB — GC/CHLAMYDIA PROBE AMP (~~LOC~~) NOT AT ARMC
Chlamydia: NEGATIVE
Comment: NEGATIVE
Comment: NORMAL
Neisseria Gonorrhea: NEGATIVE

## 2021-12-27 NOTE — Telephone Encounter (Signed)
Nurse appt scheduled 01/26/22

## 2022-01-12 ENCOUNTER — Ambulatory Visit: Payer: 59 | Admitting: Nurse Practitioner

## 2022-01-12 ENCOUNTER — Telehealth: Payer: Self-pay | Admitting: Nurse Practitioner

## 2022-01-12 NOTE — Telephone Encounter (Signed)
8.31.23 no show letter sent 

## 2022-01-12 NOTE — Telephone Encounter (Signed)
1st no show, fee waived, letter sent 

## 2022-01-20 ENCOUNTER — Telehealth: Payer: 59 | Admitting: Physician Assistant

## 2022-01-20 DIAGNOSIS — M5431 Sciatica, right side: Secondary | ICD-10-CM

## 2022-01-20 DIAGNOSIS — M545 Low back pain, unspecified: Secondary | ICD-10-CM

## 2022-01-20 DIAGNOSIS — M5441 Lumbago with sciatica, right side: Secondary | ICD-10-CM

## 2022-01-20 MED ORDER — CYCLOBENZAPRINE HCL 10 MG PO TABS: 5.0000 mg | ORAL_TABLET | Freq: Three times a day (TID) | ORAL | 0 refills | Status: DC | PRN

## 2022-01-20 MED ORDER — METHYLPREDNISOLONE 4 MG PO TBPK: ORAL_TABLET | ORAL | 0 refills | Status: DC

## 2022-01-20 NOTE — Patient Instructions (Signed)
Rebecca Rocha, thank you for joining [name], PA-C for today's virtual visit.  While this provider is not your primary care provider (PCP), if your PCP is located in our provider database this encounter information will be shared with them immediately following your visit.  Consent: (Patient) Rebecca Rocha provided verbal consent for this virtual visit at the beginning of the encounter.  Current Medications:  Current Outpatient Medications:    cyclobenzaprine (FLEXERIL) 10 MG tablet, Take 0.5-1 tablets (5-10 mg total) by mouth 3 (three) times daily as needed., Disp: 30 tablet, Rfl: 0   methylPREDNISolone (MEDROL DOSEPAK) 4 MG TBPK tablet, 6 day taper; take as directed on package instructions, Disp: 21 tablet, Rfl: 0   albuterol (PROVENTIL) (2.5 MG/3ML) 0.083% nebulizer solution, Take 3 mLs (2.5 mg total) by nebulization every 6 (six) hours as needed for wheezing or shortness of breath., Disp: 100 mL, Rfl: 0   albuterol (VENTOLIN HFA) 108 (90 Base) MCG/ACT inhaler, INHALE 1 TO 2 PUFFS BY MOUTH EVERY 6 HOURS AS NEEDED FOR WHEEZING OR SHORTNESS OF BREATH, Disp: 8.5 g, Rfl: 2   buPROPion (WELLBUTRIN SR) 150 MG 12 hr tablet, Take 1 tablet (150 mg total) by mouth 2 (two) times daily., Disp: 180 tablet, Rfl: 3   clotrimazole-betamethasone (LOTRISONE) cream, Apply 1 application. topically 2 (two) times daily., Disp: 45 g, Rfl: 1   dicyclomine (BENTYL) 20 MG tablet, Take 1 tablet (20 mg total) by mouth 2 (two) times daily., Disp: 60 tablet, Rfl: 2   fluticasone (FLOVENT HFA) 110 MCG/ACT inhaler, Inhale 1 puff into the lungs 2 (two) times daily. Rinse mouth after each use, Disp: 1 each, Rfl: 5   gabapentin (NEURONTIN) 100 MG capsule, TAKE 1 CAPSULE AT BEDTIME X1 WEEK THEN 1 CAPSULE TWICE DAILY X2 WEEKS THE 1 CAPSULE EVERY MORNING AND 2 CAPSULES AT BEDTIME ORALLY, Disp: 90 capsule, Rfl: 3   hydrOXYzine (VISTARIL) 25 MG capsule, Take 1 capsule (25 mg total) by mouth 3  (three) times daily as needed., Disp: 30 capsule, Rfl: 0   hyoscyamine (ANASPAZ) 0.125 MG TBDP disintergrating tablet, Place 1 tablet (0.125 mg total) under the tongue every 6 (six) hours as needed for cramping., Disp: 90 tablet, Rfl: 2   medroxyPROGESTERone (DEPO-PROVERA) 150 MG/ML injection, Inject 150 mg into the muscle every 3 (three) months., Disp: , Rfl:    ondansetron (ZOFRAN) 4 MG tablet, Take 1 tablet (4 mg total) by mouth every 6 (six) hours as needed for nausea or vomiting., Disp: 20 tablet, Rfl: 0   pantoprazole (PROTONIX) 40 MG tablet, Take 1 tablet (40 mg total) by mouth daily., Disp: 30 tablet, Rfl: 3   phenazopyridine (PYRIDIUM) 95 MG tablet, Take by mouth., Disp: , Rfl:    terbinafine (LAMISIL) 250 MG tablet, Take 1 tablet (250 mg total) by mouth daily., Disp: 90 tablet, Rfl: 0   Medications ordered in this encounter:  Meds ordered this encounter  Medications   methylPREDNISolone (MEDROL DOSEPAK) 4 MG TBPK tablet    Sig: 6 day taper; take as directed on package instructions    Dispense:  21 tablet    Refill:  0    Order Specific Question:   Supervising Provider    Answer:   Hyacinth Meeker, BRIAN [[id number]]   cyclobenzaprine (FLEXERIL) 10 MG tablet    Sig: Take 0.5-1 tablets (5-10 mg total) by mouth 3 (three) times daily as needed.    Dispense:  30 tablet    Refill:  0    Order Specific Question:  Supervising Provider    Answer:   Eber Hong [phone]     *If you need refills on other medications prior to your next appointment, please contact your pharmacy*  Follow-Up: Call back or seek an in-person evaluation if the symptoms worsen or if the condition fails to improve as anticipated.  Other Instructions Back Exercises The following exercises strengthen the muscles that help to support the trunk (torso) and back. They also help to keep the lower back flexible. Doing these exercises can help to prevent or lessen existing low back pain. If you have back pain or discomfort, try  doing these exercises 2-3 times each day or as told by your health care provider. As your pain improves, do them once each day, but increase the number of times that you repeat the steps for each exercise (do more repetitions). To prevent the recurrence of back pain, continue to do these exercises once each day or as told by your health care provider. Do exercises exactly as told by your health care provider and adjust them as directed. It is normal to feel mild stretching, pulling, tightness, or discomfort as you do these exercises, but you should stop right away if you feel sudden pain or your pain gets worse. Exercises Single knee to chest Repeat these steps 3-5 times for each leg: Lie on your back on a firm bed or the floor with your legs extended. Bring one knee to your chest. Your other leg should stay extended and in contact with the floor. Hold your knee in place by grabbing your knee or thigh with both hands and hold. Pull on your knee until you feel a gentle stretch in your lower back or buttocks. Hold the stretch for 10-30 seconds. Slowly release and straighten your leg.  Pelvic tilt Repeat these steps 5-10 times: Lie on your back on a firm bed or the floor with your legs extended. Bend your knees so they are pointing toward the ceiling and your feet are flat on the floor. Tighten your lower abdominal muscles to press your lower back against the floor. This motion will tilt your pelvis so your tailbone points up toward the ceiling instead of pointing to your feet or the floor. With gentle tension and even breathing, hold this position for 5-10 seconds.  Cat-cow Repeat these steps until your lower back becomes more flexible: Get into a hands-and-knees position on a firm bed or the floor. Keep your hands under your shoulders, and keep your knees under your hips. You may place padding under your knees for comfort. Let your head hang down toward your chest. Contract your abdominal  muscles and point your tailbone toward the floor so your lower back becomes rounded like the back of a cat. Hold this position for 5 seconds. Slowly lift your head, let your abdominal muscles relax, and point your tailbone up toward the ceiling so your back forms a sagging arch like the back of a cow. Hold this position for 5 seconds.  Press-ups Repeat these steps 5-10 times: Lie on your abdomen (face-down) on a firm bed or the floor. Place your palms near your head, about shoulder-width apart. Keeping your back as relaxed as possible and keeping your hips on the floor, slowly straighten your arms to raise the top half of your body and lift your shoulders. Do not use your back muscles to raise your upper torso. You may adjust the placement of your hands to make yourself more comfortable. Hold this position for  5 seconds while you keep your back relaxed. Slowly return to lying flat on the floor.  Bridges Repeat these steps 10 times: Lie on your back on a firm bed or the floor. Bend your knees so they are pointing toward the ceiling and your feet are flat on the floor. Your arms should be flat at your sides, next to your body. Tighten your buttocks muscles and lift your buttocks off the floor until your waist is at almost the same height as your knees. You should feel the muscles working in your buttocks and the back of your thighs. If you do not feel these muscles, slide your feet 1-2 inches (2.5-5 cm) farther away from your buttocks. Hold this position for 3-5 seconds. Slowly lower your hips to the starting position, and allow your buttocks muscles to relax completely. If this exercise is too easy, try doing it with your arms crossed over your chest. Abdominal crunches Repeat these steps 5-10 times: Lie on your back on a firm bed or the floor with your legs extended. Bend your knees so they are pointing toward the ceiling and your feet are flat on the floor. Cross your arms over your  chest. Tip your chin slightly toward your chest without bending your neck. Tighten your abdominal muscles and slowly raise your torso high enough to lift your shoulder blades a tiny bit off the floor. Avoid raising your torso higher than that because it can put too much stress on your lower back and does not help to strengthen your abdominal muscles. Slowly return to your starting position.  Back lifts Repeat these steps 5-10 times: Lie on your abdomen (face-down) with your arms at your sides, and rest your forehead on the floor. Tighten the muscles in your legs and your buttocks. Slowly lift your chest off the floor while you keep your hips pressed to the floor. Keep the back of your head in line with the curve in your back. Your eyes should be looking at the floor. Hold this position for 3-5 seconds. Slowly return to your starting position.  Contact a health care provider if: Your back pain or discomfort gets much worse when you do an exercise. Your worsening back pain or discomfort does not lessen within 2 hours after you exercise. If you have any of these problems, stop doing these exercises right away. Do not do them again unless your health care provider says that you can. Get help right away if: You develop sudden, severe back pain. If this happens, stop doing the exercises right away. Do not do them again unless your health care provider says that you can. This information is not intended to replace advice given to you by your health care provider. Make sure you discuss any questions you have with your health care provider. Document Revised: 10/26/2020 Document Reviewed: 07/14/2020 Elsevier Patient Education  2023 [employer]    If you have been instructed to have an in-person evaluation today at a local Urgent Care facility, please use the link below. It will take you to a list of all of our available [facility], including address, phone number and hours of  operation. Please do not delay care.  [facility]  If you or a family member do not have a primary care provider, use the link below to schedule a visit and establish care. When you choose a [facility] primary care physician or advanced practice provider, you gain a long-term partner in health.  Find a Primary Care Provider  Learn more about [facility]'s in-office and virtual care options: [facility]

## 2022-01-20 NOTE — Progress Notes (Signed)
Virtual Visit Consent   Rebecca Rocha, you are scheduled for a virtual visit with a [facility] provider today. Just as with appointments in the office, your consent must be obtained to participate. Your consent will be active for this visit and any virtual visit you may have with one of our providers in the next 365 days. If you have a MyChart account, a copy of this consent can be sent to you electronically.  As this is a virtual visit, video technology does not allow for your provider to perform a traditional examination. This may limit your provider's ability to fully assess your condition. If your provider identifies any concerns that need to be evaluated in person or the need to arrange testing (such as labs, EKG, etc.), we will make arrangements to do so. Although advances in technology are sophisticated, we cannot ensure that it will always work on either your end or our end. If the connection with a video visit is poor, the visit may have to be switched to a telephone visit. With either a video or telephone visit, we are not always able to ensure that we have a secure connection.  By engaging in this virtual visit, you consent to the provision of healthcare and authorize for your insurance to be billed (if applicable) for the services provided during this visit. Depending on your insurance coverage, you may receive a charge related to this service.  I need to obtain your verbal consent now. Are you willing to proceed with your visit today? Rebecca Rocha has provided verbal consent on 01/20/2022 for a virtual visit (video or telephone). [name], PA-C  Date: 01/20/2022 10:24 AM  Virtual Visit via Video Note   I, [name], connected with  Rebecca Rocha  ([id number], 12/17/2000) on 01/20/22 at 10:15 AM EDT by a video-enabled telemedicine application and verified that I am speaking with the correct person using two  identifiers.  Location: Patient: Virtual Visit Location Patient: Home Provider: Virtual Visit Location Provider: Home Office   I discussed the limitations of evaluation and management by telemedicine and the availability of in person appointments. The patient expressed understanding and agreed to proceed.    History of Present Illness: Rebecca Rocha is a 21 y.o. who identifies as a female who was assigned female at birth, and is being seen today for back pain.  HPI: Back Pain This is a recurrent problem. The current episode started yesterday. The problem occurs constantly. The problem has been gradually worsening since onset. The pain is present in the lumbar spine. The quality of the pain is described as shooting, cramping and stabbing. The pain radiates to the right knee and right thigh. The pain is moderate. The pain is The same all the time. The symptoms are aggravated by lying down (movement causes pain). Stiffness is present All day (tightness that comes and goes). Pertinent negatives include no bladder incontinence, bowel incontinence, fever, numbness, perianal numbness, tingling or weakness. She has tried bed rest and NSAIDs for the symptoms. The treatment provided no relief.      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   Chronic upper back pain 09/18/2019   Allergy to environmental factors 10/27/2016   Asthma 10/27/2016    Allergies: No Known Allergies Medications:  Current  Outpatient Medications:    cyclobenzaprine (FLEXERIL) 10 MG tablet, Take 0.5-1 tablets (5-10 mg total) by mouth 3 (three) times daily as needed., Disp: 30 tablet, Rfl: 0   methylPREDNISolone (MEDROL DOSEPAK) 4 MG TBPK tablet, 6 day taper; take as directed on package instructions, Disp: 21 tablet, Rfl: 0   albuterol (PROVENTIL) (2.5 MG/3ML) 0.083% nebulizer solution, Take 3 mLs (2.5 mg total) by nebulization every 6 (six) hours as needed for wheezing or shortness of breath., Disp: 100 mL, Rfl: 0   albuterol (VENTOLIN HFA) 108 (90 Base) MCG/ACT inhaler, INHALE 1 TO 2 PUFFS BY MOUTH EVERY 6 HOURS AS NEEDED FOR WHEEZING OR SHORTNESS OF BREATH, Disp: 8.5 g, Rfl: 2   buPROPion (WELLBUTRIN SR) 150 MG 12 hr tablet, Take 1 tablet (150 mg total) by mouth 2 (two) times daily., Disp: 180 tablet, Rfl: 3   clotrimazole-betamethasone (LOTRISONE) cream, Apply 1 application. topically 2 (two) times daily., Disp: 45 g, Rfl: 1   dicyclomine (BENTYL) 20 MG tablet, Take 1 tablet (20 mg total) by mouth 2 (two) times daily., Disp: 60 tablet, Rfl: 2   fluticasone (FLOVENT HFA) 110 MCG/ACT inhaler, Inhale 1 puff into the lungs 2 (two) times daily. Rinse mouth after each use, Disp: 1 each, Rfl: 5   gabapentin (NEURONTIN) 100 MG capsule, TAKE 1 CAPSULE AT BEDTIME X1 WEEK THEN 1 CAPSULE TWICE DAILY X2 WEEKS THE 1 CAPSULE EVERY MORNING AND 2 CAPSULES AT BEDTIME ORALLY, Disp: 90 capsule, Rfl: 3   hydrOXYzine (VISTARIL) 25 MG capsule, Take 1 capsule (25 mg total) by mouth 3 (three) times daily as needed., Disp: 30 capsule, Rfl: 0   hyoscyamine (ANASPAZ) 0.125 MG TBDP disintergrating tablet, Place 1 tablet (0.125 mg total) under the tongue every 6 (six) hours as needed for cramping., Disp: 90 tablet, Rfl: 2   medroxyPROGESTERone (DEPO-PROVERA) 150 MG/ML injection, Inject 150 mg into the muscle every 3 (three) months., Disp: , Rfl:    ondansetron (ZOFRAN) 4 MG tablet, Take 1 tablet (4 mg total) by mouth every 6 (six) hours as needed  for nausea or vomiting., Disp: 20 tablet, Rfl: 0   pantoprazole (PROTONIX) 40 MG tablet, Take 1 tablet (40 mg total) by mouth daily., Disp: 30 tablet, Rfl: 3   phenazopyridine (PYRIDIUM) 95 MG tablet, Take by mouth., Disp: , Rfl:    terbinafine (LAMISIL) 250 MG tablet, Take 1 tablet (250 mg total) by mouth daily., Disp: 90 tablet, Rfl: 0  Observations/Objective: Patient is well-developed, well-nourished in no acute distress.  Resting comfortably at home.  Head is normocephalic, atraumatic.  No labored breathing.  Speech is clear and coherent with logical content.  Patient is alert and oriented at baseline.    Assessment and Plan: 1. Acute bilateral low back pain with right-sided sciatica - methylPREDNISolone (MEDROL DOSEPAK) 4 MG TBPK tablet; 6 day taper; take as directed on package instructions  Dispense: 21 tablet; Refill: 0 - cyclobenzaprine (FLEXERIL) 10 MG tablet; Take 0.5-1 tablets (5-10 mg total) by mouth 3 (three) times daily as needed.  Dispense: 30 tablet; Refill: 0  - Suspect muscle spasms with sciatica - Failed NSAIDs - Add Medrol dose pack and flexeril - Avoid NSAIDs (Ibuprofen/Advil/Motrin or Naproxen/Aleve) while on steroid - Tylenol if okay for breakthrough pain -  Heat to area - Epsom salt soak if able to get in and out of bath tub safely - Back exercises and stretches provided via AVS - Seek in person evaluation if worsening or fails to improve with treatment   Follow Up Instructions: I discussed the assessment and treatment plan with the patient. The patient was provided an opportunity to ask questions and all were answered. The patient agreed with the plan and demonstrated an understanding of the instructions.  A copy of instructions were sent to the patient via MyChart unless otherwise noted below.    The patient was advised to call back or seek an in-person evaluation if the symptoms worsen or if the condition fails to improve as anticipated.  Time:  I spent  10 minutes with the patient via telehealth technology discussing the above problems/concerns.    [name], PA-C

## 2022-01-20 NOTE — Progress Notes (Signed)
Because you are describing 10/10 severe back pain with numbness, I feel your condition warrants further evaluation and I recommend that you be seen in a face to face visit.   NOTE: There will be NO CHARGE for this eVisit   If you are having a true medical emergency please call 911.      For an urgent face to face visit, [place] has seven urgent care centers for your convenience:     [facility] at [facility] Directions [phone] [address] [place], [place] [address]    [facility]) Get Driving Directions [phone] [address] [place], [place] [address]  [facility] Center [facility]) Get Driving Directions [phone] [address] [place],  [place]  [address]  [facility] - at [employer] Directions  [phone] [address] [place],  [place] [address]   [facility] Health Urgent Care at [facility] Get Driving Directions [phone] [address] [place], [place] [address]   [facility] Urgent Care at [facility] Get Driving Directions  [phone] [address] [place], [place] [address]   [facility] Health Urgent Care at [facility] Directions [phone] [address] [place], [place] [address]  Your MyChart E-visit questionnaire answers were reviewed by a board certified advanced clinical practitioner to complete your personal care plan based on your specific symptoms.  Thank you for using e-Visits.   I provided 5 minutes of non face-to-face time during this encounter for chart review and documentation.

## 2022-01-26 ENCOUNTER — Ambulatory Visit: Payer: 59

## 2022-01-27 ENCOUNTER — Ambulatory Visit (INDEPENDENT_AMBULATORY_CARE_PROVIDER_SITE_OTHER): Payer: 59

## 2022-01-27 DIAGNOSIS — Z3042 Encounter for surveillance of injectable contraceptive: Secondary | ICD-10-CM

## 2022-01-27 MED ORDER — MEDROXYPROGESTERONE ACETATE 150 MG/ML IM SUSP
150.0000 mg | Freq: Once | INTRAMUSCULAR | Status: AC
  Administered 2022-01-27: 150 mg via INTRAMUSCULAR

## 2022-02-02 ENCOUNTER — Ambulatory Visit: Payer: 59 | Admitting: Nurse Practitioner

## 2022-02-16 ENCOUNTER — Ambulatory Visit: Payer: 59 | Admitting: Nurse Practitioner

## 2022-02-22 ENCOUNTER — Encounter: Payer: Self-pay | Admitting: Nurse Practitioner

## 2022-02-22 ENCOUNTER — Ambulatory Visit (INDEPENDENT_AMBULATORY_CARE_PROVIDER_SITE_OTHER): Payer: 59 | Admitting: Nurse Practitioner

## 2022-02-22 VITALS — BP 112/78 | HR 84 | Temp 97.7°F | Ht 60.0 in | Wt 157.8 lb

## 2022-02-22 DIAGNOSIS — G8929 Other chronic pain: Secondary | ICD-10-CM | POA: Diagnosis not present

## 2022-02-22 DIAGNOSIS — G43009 Migraine without aura, not intractable, without status migrainosus: Secondary | ICD-10-CM

## 2022-02-22 DIAGNOSIS — M7918 Myalgia, other site: Secondary | ICD-10-CM | POA: Diagnosis not present

## 2022-02-22 MED ORDER — RIZATRIPTAN BENZOATE 5 MG PO TABS: 5.0000 mg | ORAL_TABLET | ORAL | 0 refills | Status: DC | PRN

## 2022-02-22 NOTE — Progress Notes (Signed)
Established Patient Visit  Patient: Rebecca Rocha   DOB: 11-08-2000   21 y.o. Female  MRN: [id number] Visit Date: 02/22/2022  Subjective:    Chief Complaint  Patient presents with   office visit    Office Visit F/U sore on lip. Patient states she is concerned about Migraines as well.    HPI Migraine Chronic, waxing and waning, frontal headache with ocular symptoms (dark spots in lower part of vision), occurs 1-2x/week, denies any change in vision, last eye exam 28months ago, use of corrective lens. Minimal improvement with tylenol CT head 07/2020: normal. No hx of seizures, no Hx of head trauma.  Try imitrex and tylenol for episodic headache Consider use of topamax if no improvement?    Chronic myofascial pain Persistent generalized muscle aches, waxing and waning, no improvement with tylenol and NSAIDs. She did not start gabapentin as prescribed.  Advised to start gabapentin and titrate dose up as prescribed. F/up in 58month Generalized back pain x 52month, constant,  associated with frontal headache, intermittent, throbbing, sees black spots in vision intermittent Last eye exam 48months ago, use of corrective lens Use of tylenol and prednisone, some improvement  Reviewed medical, surgical, and social history today  Medications: Outpatient Medications Prior to Visit  Medication Sig   albuterol (PROVENTIL) (2.5 MG/3ML) 0.083% nebulizer solution Take 3 mLs (2.5 mg total) by nebulization every 6 (six) hours as needed for wheezing or shortness of breath.   albuterol (VENTOLIN HFA) 108 (90 Base) MCG/ACT inhaler INHALE 1 TO 2 PUFFS BY MOUTH EVERY 6 HOURS AS NEEDED FOR WHEEZING OR SHORTNESS OF BREATH   buPROPion (WELLBUTRIN SR) 150 MG 12 hr tablet Take 1 tablet (150 mg total) by mouth 2 (two) times daily.   cyclobenzaprine (FLEXERIL) 10 MG tablet Take 0.5-1 tablets (5-10 mg total) by mouth 3 (three) times daily as needed.   fluticasone (FLOVENT HFA) 110  MCG/ACT inhaler Inhale 1 puff into the lungs 2 (two) times daily. Rinse mouth after each use   gabapentin (NEURONTIN) 100 MG capsule TAKE 1 CAPSULE AT BEDTIME X1 WEEK THEN 1 CAPSULE TWICE DAILY X2 WEEKS THE 1 CAPSULE EVERY MORNING AND 2 CAPSULES AT BEDTIME ORALLY   hyoscyamine (ANASPAZ) 0.125 MG TBDP disintergrating tablet Place 1 tablet (0.125 mg total) under the tongue every 6 (six) hours as needed for cramping.   medroxyPROGESTERone (DEPO-PROVERA) 150 MG/ML injection Inject 150 mg into the muscle every 3 (three) months.   ondansetron (ZOFRAN) 4 MG tablet Take 1 tablet (4 mg total) by mouth every 6 (six) hours as needed for nausea or vomiting.   pantoprazole (PROTONIX) 40 MG tablet Take 1 tablet (40 mg total) by mouth daily.   phenazopyridine (PYRIDIUM) 95 MG tablet Take by mouth.   terbinafine (LAMISIL) 250 MG tablet Take 1 tablet (250 mg total) by mouth daily.   valACYclovir (VALTREX) 500 MG tablet Patient reports using medication prn   [DISCONTINUED] clotrimazole-betamethasone (LOTRISONE) cream Apply 1 application. topically 2 (two) times daily. (Patient not taking: Reported on 02/22/2022)   [DISCONTINUED] dicyclomine (BENTYL) 20 MG tablet Take 1 tablet (20 mg total) by mouth 2 (two) times daily. (Patient not taking: Reported on 02/22/2022)   [DISCONTINUED] hydrOXYzine (VISTARIL) 25 MG capsule Take 1 capsule (25 mg total) by mouth 3 (three) times daily as needed. (Patient not taking: Reported on 02/22/2022)   [DISCONTINUED] methylPREDNISolone (MEDROL DOSEPAK) 4 MG TBPK tablet 6 day taper; take as directed on  package instructions (Patient not taking: Reported on 02/22/2022)   No facility-administered medications prior to visit.   Reviewed past medical and social history.   ROS per HPI above  Last CBC Lab Results  Component Value Date   WBC 6.1 12/17/2021   HGB 12.8 12/17/2021   HCT 39.8 12/17/2021   MCV 93.2 12/17/2021   MCH 30.0 12/17/2021   RDW 11.9 12/17/2021   PLT 332 12/17/2021    Last metabolic panel Lab Results  Component Value Date   GLUCOSE 97 12/17/2021   NA 136 12/17/2021   K 3.3 (L) 12/17/2021   CL 105 12/17/2021   CO2 25 12/17/2021   BUN 7 12/17/2021   CREATININE 0.80 12/17/2021   GFRNONAA >60 12/17/2021   CALCIUM 8.7 (L) 12/17/2021   PROT 7.7 05/31/2021   ALBUMIN 4.2 05/31/2021   BILITOT 0.5 05/31/2021   ALKPHOS 61 05/31/2021   AST 16 05/31/2021   ALT 16 05/31/2021   ANIONGAP 6 12/17/2021   Last thyroid functions Lab Results  Component Value Date   TSH 0.42 07/07/2021      Objective:  BP 112/78 (BP Location: Right Arm, Patient Position: Sitting, Cuff Size: Normal)   Pulse 84   Temp 97.7 F (36.5 C) (Temporal)   Ht 5' (1.524 m)   Wt 157 lb 12.8 oz (71.6 kg)   SpO2 96%   BMI 30.82 kg/m      Physical Exam Vitals reviewed.  HENT:     Right Ear: Tympanic membrane, ear canal and external ear normal.     Left Ear: Tympanic membrane, ear canal and external ear normal.  Eyes:     Extraocular Movements: Extraocular movements intact.     Conjunctiva/sclera: Conjunctivae normal.     Pupils: Pupils are equal, round, and reactive to light.  Cardiovascular:     Rate and Rhythm: Normal rate and regular rhythm.     Pulses: Normal pulses.     Heart sounds: Normal heart sounds.  Pulmonary:     Effort: Pulmonary effort is normal.     Breath sounds: Normal breath sounds.  Musculoskeletal:        General: No swelling or deformity. Normal range of motion.     Cervical back: Normal range of motion and neck supple.     Right lower leg: No edema.     Left lower leg: No edema.  Lymphadenopathy:     Cervical: No cervical adenopathy.  Skin:    General: Skin is warm and dry.  Neurological:     Mental Status: She is alert and oriented to person, place, and time.     Cranial Nerves: No cranial nerve deficit.     Motor: No weakness.     No results found for any visits on 02/22/22.    Assessment & Plan:    Problem List Items Addressed This  Visit       Cardiovascular and Mediastinum   Migraine - Primary    Chronic, waxing and waning, frontal headache with ocular symptoms (dark spots in lower part of vision), occurs 1-2x/week, denies any change in vision, last eye exam 52months ago, use of corrective lens. Minimal improvement with tylenol CT head 07/2020: normal. No hx of seizures, no Hx of head trauma.  Try imitrex and tylenol for episodic headache Consider use of topamax if no improvement?        Relevant Medications   rizatriptan (MAXALT) 5 MG tablet     Other   Chronic myofascial pain  Persistent generalized muscle aches, waxing and waning, no improvement with tylenol and NSAIDs. She did not start gabapentin as prescribed.  Advised to start gabapentin and titrate dose up as prescribed. F/up in 46month      Return in about 4 weeks (around 03/22/2022) for generalized and headache.     Wilfred Lacy, NP

## 2022-02-22 NOTE — Patient Instructions (Signed)
Start gabapentin as prescribed Use tylenol and maxalt as needed for headache/migraine. F/up in 66month  Migraine Headache A migraine headache is a very strong throbbing pain on one side or both sides of your head. This type of headache can also cause other symptoms. It can last from 4 hours to 3 days. Talk with your doctor about what things may bring on (trigger) this condition. What are the causes? The exact cause of this condition is not known. This condition may be triggered or caused by: Drinking alcohol. Smoking. Taking medicines, such as: Medicine used to treat chest pain (nitroglycerin). Birth control pills. Estrogen. Some blood pressure medicines. Eating or drinking certain products. Doing physical activity. Other things that may trigger a migraine headache include: Having a menstrual period. Pregnancy. Hunger. Stress. Not getting enough sleep or getting too much sleep. Weather changes. Tiredness (fatigue). What increases the risk? Being 74-65 years old. Being female. Having a family history of migraine headaches. Being Caucasian. Having depression or anxiety. Being very overweight. What are the signs or symptoms? A throbbing pain. This pain may: Happen in any area of the head, such as on one side or both sides. Make it hard to do daily activities. Get worse with physical activity. Get worse around bright lights or loud noises. Other symptoms may include: Feeling sick to your stomach (nauseous). Vomiting. Dizziness. Being sensitive to bright lights, loud noises, or smells. Before you get a migraine headache, you may get warning signs (an aura). An aura may include: Seeing flashing lights or having blind spots. Seeing bright spots, halos, or zigzag lines. Having tunnel vision or blurred vision. Having numbness or a tingling feeling. Having trouble talking. Having weak muscles. Some people have symptoms after a migraine headache (postdromal phase), such  as: Tiredness. Trouble thinking (concentrating). How is this treated? Taking medicines that: Relieve pain. Relieve the feeling of being sick to your stomach. Prevent migraine headaches. Treatment may also include: Having acupuncture. Avoiding foods that bring on migraine headaches. Learning ways to control your body functions (biofeedback). Therapy to help you know and deal with negative thoughts (cognitive behavioral therapy). Follow these instructions at home: Medicines Take over-the-counter and prescription medicines only as told by your doctor. Ask your doctor if the medicine prescribed to you: Requires you to avoid driving or using heavy machinery. Can cause trouble pooping (constipation). You may need to take these steps to prevent or treat trouble pooping: Drink enough fluid to keep your pee (urine) pale yellow. Take over-the-counter or prescription medicines. Eat foods that are high in fiber. These include beans, whole grains, and fresh fruits and vegetables. Limit foods that are high in fat and sugar. These include fried or sweet foods. Lifestyle Do not drink alcohol. Do not use any products that contain nicotine or tobacco, such as cigarettes, e-cigarettes, and chewing tobacco. If you need help quitting, ask your doctor. Get at least 8 hours of sleep every night. Limit and deal with stress. General instructions     Keep a journal to find out what may bring on your migraine headaches. For example, write down: What you eat and drink. How much sleep you get. Any change in what you eat or drink. Any change in your medicines. If you have a migraine headache: Avoid things that make your symptoms worse, such as bright lights. It may help to lie down in a dark, quiet room. Do not drive or use heavy machinery. Ask your doctor what activities are safe for you. Keep all follow-up visits  as told by your doctor. This is important. Contact a doctor if: You get a migraine  headache that is different or worse than others you have had. You have more than 15 headache days in one month. Get help right away if: Your migraine headache gets very bad. Your migraine headache lasts longer than 72 hours. You have a fever. You have a stiff neck. You have trouble seeing. Your muscles feel weak or like you cannot control them. You start to lose your balance a lot. You start to have trouble walking. You pass out (faint). You have a seizure. Summary A migraine headache is a very strong throbbing pain on one side or both sides of your head. These headaches can also cause other symptoms. This condition may be treated with medicines and changes to your lifestyle. Keep a journal to find out what may bring on your migraine headaches. Contact a doctor if you get a migraine headache that is different or worse than others you have had. Contact your doctor if you have more than 15 headache days in a month. This information is not intended to replace advice given to you by your health care provider. Make sure you discuss any questions you have with your health care provider. Document Revised: 08/23/2018 Document Reviewed: 06/13/2018 Elsevier Patient Education  2023 [employer].

## 2022-02-22 NOTE — Assessment & Plan Note (Addendum)
Chronic, waxing and waning, frontal headache with ocular symptoms (dark spots in lower part of vision), occurs 1-2x/week, denies any change in vision, last eye exam 42months ago, use of corrective lens. Minimal improvement with tylenol CT head 07/2020: normal. No hx of seizures, no Hx of head trauma.  Try imitrex and tylenol for episodic headache Consider use of topamax if no improvement?

## 2022-02-22 NOTE — Assessment & Plan Note (Addendum)
Persistent generalized muscle aches, waxing and waning, no improvement with tylenol and NSAIDs. She did not start gabapentin as prescribed.  Advised to start gabapentin and titrate dose up as prescribed. Continue daily exercise F/up in 44month

## 2022-03-02 ENCOUNTER — Ambulatory Visit: Payer: 59 | Admitting: Nurse Practitioner

## 2022-03-16 ENCOUNTER — Encounter: Payer: Self-pay | Admitting: Family Medicine

## 2022-03-16 ENCOUNTER — Ambulatory Visit (INDEPENDENT_AMBULATORY_CARE_PROVIDER_SITE_OTHER): Payer: 59 | Admitting: Family Medicine

## 2022-03-16 VITALS — BP 122/74 | HR 70 | Temp 97.4°F | Ht 60.0 in | Wt 162.2 lb

## 2022-03-16 DIAGNOSIS — M25532 Pain in left wrist: Secondary | ICD-10-CM

## 2022-03-16 DIAGNOSIS — M545 Low back pain, unspecified: Secondary | ICD-10-CM

## 2022-03-16 MED ORDER — NAPROXEN 500 MG PO TABS: 500.0000 mg | ORAL_TABLET | Freq: Two times a day (BID) | ORAL | 0 refills | Status: DC

## 2022-03-16 NOTE — Progress Notes (Signed)
[facility] [address] [place] [address] Dept: [phone] Dept Fax: [phone]  Office Visit  Subjective:    Patient ID: Rebecca Rocha, female    DOB: 2001-05-01, 21 y.o..   MRN: [id number]  Chief Complaint  Patient presents with   Acute Visit    C/o having LT wrist pain and low back pain.  Has taken Tylenol with little relief.      History of Present Illness:  Patient is in today complaining of a 4-day history of left wrist pain and lower back pain. Ms. Dolores Frame notes she has a history of myofascial pain. She is currently being managed on gabapentin, though she is not sure this has been very effective. she denies any recent injury. Since Sunday, her left wrist has been painful with movement. She is now using a wrist splint which has helped. She also notes a band of pain across her lower back. she denies any weakness or tingling in her lower legs, though she has had some mild numbness along the lateral aspect of the left thigh. She has tried taking Tylenol for pain. She also had some leftover prednisone at home, She took a single dose of 30 mg, but does not feel that this helped. Ms. Dolores Frame works three jobs, some of which are quite physical.  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   Chronic upper back pain 09/18/2019   Allergy to environmental factors 10/27/2016   Asthma 10/27/2016   Past Surgical History:  Procedure Laterality Date    CHOLECYSTECTOMY N/A 11/26/2019   Procedure: LAPAROSCOPIC CHOLECYSTECTOMY;  Surgeon: Mickeal Skinner, MD;  Location: [facility];  Service: General;  Laterality: N/A;   Family History  Problem Relation Age of Onset   Hypertension Mother    Liver disease Mother    Anxiety disorder Mother    Kidney disease Mother    Other Mother        Negative hereditary cancer genetic testing   Hypertension Father    Diabetes Father    Anxiety disorder Father    Anxiety disorder Sister    Depression Sister    Breast cancer Maternal Grandmother 53   Diabetes Paternal Grandmother    Hypertension Paternal Grandmother    High Cholesterol Paternal Grandmother    Cancer Paternal Grandfather    Diabetes Paternal Grandfather    Hypertension Paternal Grandfather    High Cholesterol Paternal Grandfather    Other Cousin        maternal second cousin, BRCA2+   Esophageal cancer Neg Hx    Rectal cancer Neg Hx    Stomach cancer Neg Hx    Outpatient Medications Prior to Visit  Medication Sig Dispense Refill   albuterol (PROVENTIL) (2.5 MG/3ML) 0.083% nebulizer solution Take 3 mLs (2.5 mg total) by nebulization every 6 (six) hours as needed for wheezing or shortness of breath. 100 mL 0   albuterol (VENTOLIN HFA) 108 (90 Base) MCG/ACT inhaler INHALE 1 TO 2 PUFFS BY MOUTH EVERY 6 HOURS AS NEEDED FOR WHEEZING OR SHORTNESS  OF BREATH 8.5 g 2   buPROPion (WELLBUTRIN SR) 150 MG 12 hr tablet Take 1 tablet (150 mg total) by mouth 2 (two) times daily. 180 tablet 3   cyclobenzaprine (FLEXERIL) 10 MG tablet Take 0.5-1 tablets (5-10 mg total) by mouth 3 (three) times daily as needed. 30 tablet 0   fluticasone (FLOVENT HFA) 110 MCG/ACT inhaler Inhale 1 puff into the lungs 2 (two) times daily. Rinse mouth after each use 1 each 5   gabapentin (NEURONTIN) 100 MG capsule TAKE 1 CAPSULE AT BEDTIME X1 WEEK THEN 1 CAPSULE TWICE DAILY X2 WEEKS THE 1 CAPSULE EVERY MORNING AND 2 CAPSULES AT BEDTIME ORALLY 90 capsule 3   hyoscyamine  (ANASPAZ) 0.125 MG TBDP disintergrating tablet Place 1 tablet (0.125 mg total) under the tongue every 6 (six) hours as needed for cramping. 90 tablet 2   medroxyPROGESTERone (DEPO-PROVERA) 150 MG/ML injection Inject 150 mg into the muscle every 3 (three) months.     ondansetron (ZOFRAN) 4 MG tablet Take 1 tablet (4 mg total) by mouth every 6 (six) hours as needed for nausea or vomiting. 20 tablet 0   pantoprazole (PROTONIX) 40 MG tablet Take 1 tablet (40 mg total) by mouth daily. 30 tablet 3   rizatriptan (MAXALT) 5 MG tablet Take 1 tablet (5 mg total) by mouth as needed for migraine. May repeat in 2 hours if needed. No more than 36m per day 10 tablet 0   terbinafine (LAMISIL) 250 MG tablet Take 1 tablet (250 mg total) by mouth daily. 90 tablet 0   valACYclovir (VALTREX) 500 MG tablet Patient reports using medication prn     phenazopyridine (PYRIDIUM) 95 MG tablet Take by mouth.     No facility-administered medications prior to visit.   No Known Allergies    Objective:   Today's Vitals   03/16/22 0909  BP: 122/74  Pulse: 70  Temp: (!) 97.4 F (36.3 C)  TempSrc: Temporal  SpO2: 99%  Weight: 162 lb 3.2 oz (73.6 kg)  Height: 5' (1.524 m)   Body mass index is 31.68 kg/m.   General: Well developed, well nourished. No acute distress. Back: Straight. Pain in band over lower lumbar area. Extremities: Full ROM of left wrist, but painful. No swelling noted. Grip strength normal. Strength in LE 5/5. Skin: Warm and dry. No rashes. Neuro: +/- decreased sensation over the lateral apsect of the left thigh. DTR 1+ bilaterally. Psych: Alert and oriented. Normal mood and affect.  Health Maintenance Due  Topic Date Due   HPV VACCINES (3 - Risk 3-dose series) 02/16/2022     Assessment & Plan:   1. Acute pain of left wrist I suspect the wrist is hurting due to overuse form her physically demanding work. I agree with her use of the wrist splint. I will treat her with a course of naproxen twice  daily for 1 week. She should also ice her wrist itn he evening. If nto improving, she may need referral to Sports medicine.  - naproxen (NAPROSYN) 500 MG tablet; Take 1 tablet (500 mg total) by mouth 2 (two) times daily with a meal.  Dispense: 28 tablet; Refill: 0  2. Acute midline low back pain without sciatica Likely muscular back strain. The naproxen should help this improve. The lateral thigh numbness seems more consistent with a lateral femoral cutaneous nerve issue rather than a radiculopathy. We will observe this.  - naproxen (NAPROSYN) 500 MG tablet; Take 1 tablet (500 mg total) by mouth 2 (two) times daily  with a meal.  Dispense: 28 tablet; Refill: 0   Return for with PCP regardign myofascial pain.   Haydee Salter, MD

## 2022-03-27 ENCOUNTER — Other Ambulatory Visit: Payer: Self-pay | Admitting: Family Medicine

## 2022-03-27 DIAGNOSIS — M545 Low back pain, unspecified: Secondary | ICD-10-CM

## 2022-03-27 DIAGNOSIS — M25532 Pain in left wrist: Secondary | ICD-10-CM

## 2022-03-30 ENCOUNTER — Ambulatory Visit: Payer: 59 | Admitting: Nurse Practitioner

## 2022-04-07 ENCOUNTER — Other Ambulatory Visit: Payer: Self-pay | Admitting: Family Medicine

## 2022-04-07 DIAGNOSIS — M545 Low back pain, unspecified: Secondary | ICD-10-CM

## 2022-04-07 DIAGNOSIS — M25532 Pain in left wrist: Secondary | ICD-10-CM

## 2022-04-27 ENCOUNTER — Ambulatory Visit (INDEPENDENT_AMBULATORY_CARE_PROVIDER_SITE_OTHER): Payer: 59 | Admitting: *Deleted

## 2022-04-27 DIAGNOSIS — Z3042 Encounter for surveillance of injectable contraceptive: Secondary | ICD-10-CM | POA: Diagnosis not present

## 2022-04-27 MED ORDER — MEDROXYPROGESTERONE ACETATE 150 MG/ML IM SUSP
150.0000 mg | Freq: Once | INTRAMUSCULAR | Status: AC
  Administered 2022-04-27: 150 mg via INTRAMUSCULAR

## 2022-05-23 ENCOUNTER — Telehealth: Payer: 59 | Admitting: Physician Assistant

## 2022-05-23 DIAGNOSIS — M5441 Lumbago with sciatica, right side: Secondary | ICD-10-CM | POA: Diagnosis not present

## 2022-05-23 MED ORDER — CYCLOBENZAPRINE HCL 10 MG PO TABS: 5.0000 mg | ORAL_TABLET | Freq: Three times a day (TID) | ORAL | 0 refills | Status: DC | PRN

## 2022-05-23 MED ORDER — MELOXICAM 15 MG PO TABS: 15.0000 mg | ORAL_TABLET | Freq: Every day | ORAL | 0 refills | Status: DC

## 2022-05-23 NOTE — Progress Notes (Signed)
Virtual Visit Consent   Rebecca Rocha, you are scheduled for a virtual visit with a [facility] provider today. Just as with appointments in the office, your consent must be obtained to participate. Your consent will be active for this visit and any virtual visit you may have with one of our providers in the next 365 days. If you have a MyChart account, a copy of this consent can be sent to you electronically.  As this is a virtual visit, video technology does not allow for your provider to perform a traditional examination. This may limit your provider's ability to fully assess your condition. If your provider identifies any concerns that need to be evaluated in person or the need to arrange testing (such as labs, EKG, etc.), we will make arrangements to do so. Although advances in technology are sophisticated, we cannot ensure that it will always work on either your end or our end. If the connection with a video visit is poor, the visit may have to be switched to a telephone visit. With either a video or telephone visit, we are not always able to ensure that we have a secure connection.  By engaging in this virtual visit, you consent to the provision of healthcare and authorize for your insurance to be billed (if applicable) for the services provided during this visit. Depending on your insurance coverage, you may receive a charge related to this service.  I need to obtain your verbal consent now. Are you willing to proceed with your visit today? Rebecca Rocha has provided verbal consent on 05/23/2022 for a virtual visit (video or telephone). Mar Daring, PA-C  Date: 05/23/2022 1:39 PM  Virtual Visit via Video Note   I, Mar Daring, connected with  Rebecca Rocha  ([id number], 2000-09-11) on 05/23/22 at  1:30 PM EST by a video-enabled telemedicine application and verified that I am speaking with the correct person using two  identifiers.  Location: Patient: Virtual Visit Location Patient: Home Provider: Virtual Visit Location Provider: Home Office   I discussed the limitations of evaluation and management by telemedicine and the availability of in person appointments. The patient expressed understanding and agreed to proceed.    History of Present Illness: Rebecca Rocha is a 22 y.o. who identifies as a female who was assigned female at birth, and is being seen today for back pain. Radiates into legs bilaterally, L>R. Uses Ibuprofen and naproxen. Has been having acute flares for a while now. Most recently seen by PCP on 12/13/21, then ER on 12/17/21, then virtual follow up on 01/20/22, and another follow up with PCP office on 03/16/22, all for same issue. Since this has been recurrent and is not resolved completely with NSAIDs, muscle relaxers, or steroids, she is interested in seeking further evaluation with specialist.    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   Chronic upper back pain 09/18/2019   Allergy to environmental factors 10/27/2016   Asthma 10/27/2016    Allergies: No Known Allergies Medications:  Current Outpatient Medications:    meloxicam (MOBIC) 15  MG tablet, Take 1 tablet (15 mg total) by mouth daily., Disp: 30 tablet, Rfl: 0   albuterol (PROVENTIL) (2.5 MG/3ML) 0.083% nebulizer solution, Take 3 mLs (2.5 mg total) by nebulization every 6 (six) hours as needed for wheezing or shortness of breath., Disp: 100 mL, Rfl: 0   albuterol  (VENTOLIN HFA) 108 (90 Base) MCG/ACT inhaler, INHALE 1 TO 2 PUFFS BY MOUTH EVERY 6 HOURS AS NEEDED FOR WHEEZING OR SHORTNESS OF BREATH, Disp: 8.5 g, Rfl: 2   buPROPion (WELLBUTRIN SR) 150 MG 12 hr tablet, Take 1 tablet (150 mg total) by mouth 2 (two) times daily., Disp: 180 tablet, Rfl: 3   cyclobenzaprine (FLEXERIL) 10 MG tablet, Take 0.5-1 tablets (5-10 mg total) by mouth 3 (three) times daily as needed., Disp: 30 tablet, Rfl: 0   fluticasone (FLOVENT HFA) 110 MCG/ACT inhaler, Inhale 1 puff into the lungs 2 (two) times daily. Rinse mouth after each use, Disp: 1 each, Rfl: 5   gabapentin (NEURONTIN) 100 MG capsule, TAKE 1 CAPSULE AT BEDTIME X1 WEEK THEN 1 CAPSULE TWICE DAILY X2 WEEKS THE 1 CAPSULE EVERY MORNING AND 2 CAPSULES AT BEDTIME ORALLY, Disp: 90 capsule, Rfl: 3   hyoscyamine (ANASPAZ) 0.125 MG TBDP disintergrating tablet, Place 1 tablet (0.125 mg total) under the tongue every 6 (six) hours as needed for cramping., Disp: 90 tablet, Rfl: 2   medroxyPROGESTERone (DEPO-PROVERA) 150 MG/ML injection, Inject 150 mg into the muscle every 3 (three) months., Disp: , Rfl:    ondansetron (ZOFRAN) 4 MG tablet, Take 1 tablet (4 mg total) by mouth every 6 (six) hours as needed for nausea or vomiting., Disp: 20 tablet, Rfl: 0   pantoprazole (PROTONIX) 40 MG tablet, Take 1 tablet (40 mg total) by mouth daily., Disp: 30 tablet, Rfl: 3   rizatriptan (MAXALT) 5 MG tablet, Take 1 tablet (5 mg total) by mouth as needed for migraine. May repeat in 2 hours if needed. No more than 30mg  per day, Disp: 10 tablet, Rfl: 0   terbinafine (LAMISIL) 250 MG tablet, Take 1 tablet (250 mg total) by mouth daily., Disp: 90 tablet, Rfl: 0   valACYclovir (VALTREX) 500 MG tablet, Patient reports using medication prn, Disp: , Rfl:   Observations/Objective: Patient is well-developed, well-nourished in no acute distress.  Resting comfortably at home.  Head is normocephalic, atraumatic.  No labored breathing.  Speech is clear and  coherent with logical content.  Patient is alert and oriented at baseline.    Assessment and Plan: 1. Acute bilateral low back pain with right-sided sciatica - cyclobenzaprine (FLEXERIL) 10 MG tablet; Take 0.5-1 tablets (5-10 mg total) by mouth 3 (three) times daily as needed.  Dispense: 30 tablet; Refill: 0 - meloxicam (MOBIC) 15 MG tablet; Take 1 tablet (15 mg total) by mouth daily.  Dispense: 30 tablet; Refill: 0  - Suspect chronic low back pain with bilateral sciatica or radiculopathy; highly suspicious for disc disease in some form (DDD, herniation, etc) - Failed NSAIDs - Add Medrol dose pack and flexeril - Avoid NSAIDs (Ibuprofen/Advil/Motrin or Naproxen/Aleve) while on steroid - Tylenol if okay for breakthrough pain - Heat to area - Epsom salt soak if able to get in and out of bath tub safely - Back exercises and stretches provided via AVS - Seek in person evaluation if worsening or fails to improve with treatment   Follow Up Instructions: I discussed the assessment and treatment plan with the patient. The patient was provided an opportunity to ask questions and all were answered.  The patient agreed with the plan and demonstrated an understanding of the instructions.  A copy of instructions were sent to the patient via MyChart unless otherwise noted below.    The patient was advised to call back or seek an in-person evaluation if the symptoms worsen or if the condition fails to improve as anticipated.  Time:  I spent 10 minutes with the patient via telehealth technology discussing the above problems/concerns.    Mar Daring, PA-C

## 2022-05-23 NOTE — Patient Instructions (Signed)
Rebecca Rocha, thank you for joining Rebecca Loveless, PA-C for today's virtual visit.  While this provider is not your primary care provider (PCP), if your PCP is located in our provider database this encounter information will be shared with them immediately following your visit.   [name] account gives you access to today's visit and all your visits, tests, and labs performed at [facility] " click here if you don't have a Rebecca Rocha account or go to [email or web address]  Consent: (Patient) Rebecca Rocha provided verbal consent for this virtual visit at the beginning of the encounter.  Current Medications:  Current Outpatient Medications:    meloxicam (MOBIC) 15 MG tablet, Take 1 tablet (15 mg total) by mouth daily., Disp: 30 tablet, Rfl: 0   albuterol (PROVENTIL) (2.5 MG/3ML) 0.083% nebulizer solution, Take 3 mLs (2.5 mg total) by nebulization every 6 (six) hours as needed for wheezing or shortness of breath., Disp: 100 mL, Rfl: 0   albuterol (VENTOLIN HFA) 108 (90 Base) MCG/ACT inhaler, INHALE 1 TO 2 PUFFS BY MOUTH EVERY 6 HOURS AS NEEDED FOR WHEEZING OR SHORTNESS OF BREATH, Disp: 8.5 g, Rfl: 2   buPROPion (WELLBUTRIN SR) 150 MG 12 hr tablet, Take 1 tablet (150 mg total) by mouth 2 (two) times daily., Disp: 180 tablet, Rfl: 3   cyclobenzaprine (FLEXERIL) 10 MG tablet, Take 0.5-1 tablets (5-10 mg total) by mouth 3 (three) times daily as needed., Disp: 30 tablet, Rfl: 0   fluticasone (FLOVENT HFA) 110 MCG/ACT inhaler, Inhale 1 puff into the lungs 2 (two) times daily. Rinse mouth after each use, Disp: 1 each, Rfl: 5   gabapentin (NEURONTIN) 100 MG capsule, TAKE 1 CAPSULE AT BEDTIME X1 WEEK THEN 1 CAPSULE TWICE DAILY X2 WEEKS THE 1 CAPSULE EVERY MORNING AND 2 CAPSULES AT BEDTIME ORALLY, Disp: 90 capsule, Rfl: 3   hyoscyamine (ANASPAZ) 0.125 MG TBDP disintergrating tablet, Place 1 tablet (0.125 mg total) under the tongue every 6  (six) hours as needed for cramping., Disp: 90 tablet, Rfl: 2   medroxyPROGESTERone (DEPO-PROVERA) 150 MG/ML injection, Inject 150 mg into the muscle every 3 (three) months., Disp: , Rfl:    ondansetron (ZOFRAN) 4 MG tablet, Take 1 tablet (4 mg total) by mouth every 6 (six) hours as needed for nausea or vomiting., Disp: 20 tablet, Rfl: 0   pantoprazole (PROTONIX) 40 MG tablet, Take 1 tablet (40 mg total) by mouth daily., Disp: 30 tablet, Rfl: 3   rizatriptan (MAXALT) 5 MG tablet, Take 1 tablet (5 mg total) by mouth as needed for migraine. May repeat in 2 hours if needed. No more than 30mg  per day, Disp: 10 tablet, Rfl: 0   terbinafine (LAMISIL) 250 MG tablet, Take 1 tablet (250 mg total) by mouth daily., Disp: 90 tablet, Rfl: 0   valACYclovir (VALTREX) 500 MG tablet, Patient reports using medication prn, Disp: , Rfl:    Medications ordered in this encounter:  Meds ordered this encounter  Medications   cyclobenzaprine (FLEXERIL) 10 MG tablet    Sig: Take 0.5-1 tablets (5-10 mg total) by mouth 3 (three) times daily as needed.    Dispense:  30 tablet    Refill:  0    Order Specific Question:   Supervising Provider    Answer:   Merrilee Jansky   meloxicam (MOBIC) 15 MG tablet    Sig: Take 1 tablet (15 mg total) by mouth daily.    Dispense:  30 tablet    Refill:  0    Order Specific Question:   Supervising Provider    Answer:   Rebecca Rocha [[id number]]     *If you need refills on other medications prior to your next appointment, please contact your pharmacy*  Follow-Up: Call back or seek an in-person evaluation if the symptoms worsen or if the condition fails to improve as anticipated.  [facility] [phone]  Other Instructions Back Exercises The following exercises strengthen the muscles that help to support the trunk (torso) and back. They also help to keep the lower back flexible. Doing these exercises can help to prevent or lessen existing low back  pain. If you have back pain or discomfort, try doing these exercises 2-3 times each day or as told by your health care provider. As your pain improves, do them once each day, but increase the number of times that you repeat the steps for each exercise (do more repetitions). To prevent the recurrence of back pain, continue to do these exercises once each day or as told by your health care provider. Do exercises exactly as told by your health care provider and adjust them as directed. It is normal to feel mild stretching, pulling, tightness, or discomfort as you do these exercises, but you should stop right away if you feel sudden pain or your pain gets worse. Exercises Single knee to chest Repeat these steps 3-5 times for each leg: Lie on your back on a firm bed or the floor with your legs extended. Bring one knee to your chest. Your other leg should stay extended and in contact with the floor. Hold your knee in place by grabbing your knee or thigh with both hands and hold. Pull on your knee until you feel a gentle stretch in your lower back or buttocks. Hold the stretch for 10-30 seconds. Slowly release and straighten your leg.  Pelvic tilt Repeat these steps 5-10 times: Lie on your back on a firm bed or the floor with your legs extended. Bend your knees so they are pointing toward the ceiling and your feet are flat on the floor. Tighten your lower abdominal muscles to press your lower back against the floor. This motion will tilt your pelvis so your tailbone points up toward the ceiling instead of pointing to your feet or the floor. With gentle tension and even breathing, hold this position for 5-10 seconds.  Cat-cow Repeat these steps until your lower back becomes more flexible: Get into a hands-and-knees position on a firm bed or the floor. Keep your hands under your shoulders, and keep your knees under your hips. You may place padding under your knees for comfort. Let your head hang down  toward your chest. Contract your abdominal muscles and point your tailbone toward the floor so your lower back becomes rounded like the back of a cat. Hold this position for 5 seconds. Slowly lift your head, let your abdominal muscles relax, and point your tailbone up toward the ceiling so your back forms a sagging arch like the back of a cow. Hold this position for 5 seconds.  Press-ups Repeat these steps 5-10 times: Lie on your abdomen (face-down) on a firm bed or the floor. Place your palms near your head, about shoulder-width apart. Keeping your back as relaxed as possible and keeping your hips on the floor, slowly straighten your arms to raise the top half of your body and lift your shoulders. Do not use your back muscles to raise your upper torso.  You may adjust the placement of your hands to make yourself more comfortable. Hold this position for 5 seconds while you keep your back relaxed. Slowly return to lying flat on the floor.  Bridges Repeat these steps 10 times: Lie on your back on a firm bed or the floor. Bend your knees so they are pointing toward the ceiling and your feet are flat on the floor. Your arms should be flat at your sides, next to your body. Tighten your buttocks muscles and lift your buttocks off the floor until your waist is at almost the same height as your knees. You should feel the muscles working in your buttocks and the back of your thighs. If you do not feel these muscles, slide your feet 1-2 inches (2.5-5 cm) farther away from your buttocks. Hold this position for 3-5 seconds. Slowly lower your hips to the starting position, and allow your buttocks muscles to relax completely. If this exercise is too easy, try doing it with your arms crossed over your chest. Abdominal crunches Repeat these steps 5-10 times: Lie on your back on a firm bed or the floor with your legs extended. Bend your knees so they are pointing toward the ceiling and your feet are flat on  the floor. Cross your arms over your chest. Tip your chin slightly toward your chest without bending your neck. Tighten your abdominal muscles and slowly raise your torso high enough to lift your shoulder blades a tiny bit off the floor. Avoid raising your torso higher than that because it can put too much stress on your lower back and does not help to strengthen your abdominal muscles. Slowly return to your starting position.  Back lifts Repeat these steps 5-10 times: Lie on your abdomen (face-down) with your arms at your sides, and rest your forehead on the floor. Tighten the muscles in your legs and your buttocks. Slowly lift your chest off the floor while you keep your hips pressed to the floor. Keep the back of your head in line with the curve in your back. Your eyes should be looking at the floor. Hold this position for 3-5 seconds. Slowly return to your starting position.  Contact a health care provider if: Your back pain or discomfort gets much worse when you do an exercise. Your worsening back pain or discomfort does not lessen within 2 hours after you exercise. If you have any of these problems, stop doing these exercises right away. Do not do them again unless your health care provider says that you can. Get help right away if: You develop sudden, severe back pain. If this happens, stop doing the exercises right away. Do not do them again unless your health care provider says that you can. This information is not intended to replace advice given to you by your health care provider. Make sure you discuss any questions you have with your health care provider. Document Revised: 10/26/2020 Document Reviewed: 07/14/2020 Elsevier Patient Education  2023 [employer]vier Inc.    If you have been instructed to have an in-person evaluation today at a local Urgent Care facility, please use the link below. It will take you to a list of all of our available [facility], including  address, phone number and hours of operation. Please do not delay care.   Urgent Cares  If you or a family member do not have a primary care provider, use the link below to schedule a visit and establish care. When you choose  a [facility] primary care physician or advanced practice provider, you gain a long-term partner in health. Find a Primary Care Provider  Learn more about [facility]'s in-office and virtual care options: [facility]

## 2022-07-07 ENCOUNTER — Telehealth: Payer: 59 | Admitting: Physician Assistant

## 2022-07-07 DIAGNOSIS — K529 Noninfective gastroenteritis and colitis, unspecified: Secondary | ICD-10-CM

## 2022-07-07 MED ORDER — ONDANSETRON 4 MG PO TBDP
4.0000 mg | ORAL_TABLET | Freq: Three times a day (TID) | ORAL | 0 refills | Status: DC | PRN
Start: 2022-07-07 — End: 2022-08-23

## 2022-07-07 NOTE — Progress Notes (Signed)
E-Visit for Nausea and Vomiting   We are sorry that you are not feeling well. Here is how we plan to help!  Based on what you have shared with me it looks like you have a Virus that is irritating your GI tract.  Vomiting is the forceful emptying of a portion of the stomach's content through the mouth.  Although nausea and vomiting can make you feel miserable, it's important to remember that these are not diseases, but rather symptoms of an underlying illness.  When we treat short term symptoms, we always caution that any symptoms that persist should be fully evaluated in a medical office.  I have prescribed a medication that will help alleviate your symptoms and allow you to stay hydrated:  Zofran 4 mg 1 tablet every 8 hours as needed for nausea and vomiting. This is dissolved under the tongue. The coloration of vomiting I think is a combo food and the kaopectate used. Now that it is out of your system, would not expect more maroon colored vomit, but if this continues or things not improving, you have to be seen in person ASAP.   HOME CARE: Drink clear liquids.  This is very important! Dehydration (the lack of fluid) can lead to a serious complication.  Start off with 1 tablespoon every 5 minutes for 8 hours. You may begin eating bland foods after 8 hours without vomiting.  Start with saltine crackers, white bread, rice, mashed potatoes, applesauce. After 48 hours on a bland diet, you may resume a normal diet. Try to go to sleep.  Sleep often empties the stomach and relieves the need to vomit.  GET HELP RIGHT AWAY IF:  Your symptoms do not improve or worsen within 2 days after treatment. You have a fever for over 3 days. You cannot keep down fluids after trying the medication.  MAKE SURE YOU:  Understand these instructions. Will watch your condition. Will get help right away if you are not doing well or get worse.    Thank you for choosing an e-visit.  Your e-visit answers were  reviewed by a board certified advanced clinical practitioner to complete your personal care plan. Depending upon the condition, your plan could have included both over the counter or prescription medications.  Please review your pharmacy choice. Make sure the pharmacy is open so you can pick up prescription now. If there is a problem, you may contact your provider through [employer] and have the prescription routed to another pharmacy.  Your safety is important to [place]. If you have drug allergies check your prescription carefully.   For the next 24 hours you can use MyChart to ask questions about today's visit, request a non-urgent call back, or ask for a work or school excuse. You will get an email in the next two days asking about your experience. I hope that your e-visit has been valuable and will speed your recovery.

## 2022-07-07 NOTE — Progress Notes (Signed)
I have spent 5 minutes in review of e-visit questionnaire, review and updating patient chart, medical decision making and response to patient.   Tandi Hanko Cody Nidal Rivet, PA-C    

## 2022-07-11 ENCOUNTER — Encounter: Payer: 59 | Admitting: Nurse Practitioner

## 2022-07-14 ENCOUNTER — Ambulatory Visit: Payer: 59

## 2022-07-18 ENCOUNTER — Ambulatory Visit (INDEPENDENT_AMBULATORY_CARE_PROVIDER_SITE_OTHER): Payer: 59 | Admitting: Obstetrics & Gynecology

## 2022-07-18 ENCOUNTER — Encounter: Payer: Self-pay | Admitting: Obstetrics & Gynecology

## 2022-07-18 VITALS — BP 104/72 | HR 76

## 2022-07-18 DIAGNOSIS — Z3169 Encounter for other general counseling and advice on procreation: Secondary | ICD-10-CM

## 2022-07-18 DIAGNOSIS — Z3042 Encounter for surveillance of injectable contraceptive: Secondary | ICD-10-CM

## 2022-07-18 NOTE — Progress Notes (Signed)
    Rebecca Rocha 09/27/2000 [id number]        22 y.o.  G0 Married  RP: Preconception counseling  HPI: Last DepoProvera on 04/27/22.  No menses since then.  No pelvic pain.  No vaginal d/c.  Healthy on no meds.  No h/o Gono or Chlam.  No previous Gyn surgery.  Menses were regular normal before starting on contraceptives (BCPs, Nexplanon, DepoProvera).  Husband healthy, no previous paternity.  No testicular problem.     OB History  Gravida Para Term Preterm AB Living  0 0 0 0 0 0  SAB IAB Ectopic Multiple Live Births  0 0 0 0 0    Past medical history,surgical history, problem list, medications, allergies, family history and social history were all reviewed and documented in the EPIC chart.   Directed ROS with pertinent positives and negatives documented in the history of present illness/assessment and plan.  Exam:  Vitals:   07/18/22 1613  BP: 104/72  Pulse: 76  SpO2: 99%   General appearance:  Normal   Gynecologic exam: Deferred   Assessment/Plan:  22 y.o. G0P0000   1. Encounter for preconception consultation Last DepoProvera on 04/27/22.  No menses since then.  No pelvic pain.  No vaginal d/c.  Healthy on no meds.  No h/o Gono or Chlam.  No previous Gyn surgery.  Menses were regular normal before starting on contraceptives (BCPs, Nexplanon, DepoProvera).  Husband healthy, no previous paternity.  No testicular problem.   Desires to conceive.  Informed that it may take up to a year before resuming ovulation post last DepoProvera injection.  May use condoms until her first menstrual period and then attempt conception.  If attempts conception before her first period, recommend HPTs at intervals and Ob US dating.  Start on PNVs.  Health nutrition and regular physical activities recommended.   2. Surveillance for Depo-Provera contraception  Last DepoProvera 04/27/22.    Princess Bruins MD, 4:24 PM 07/18/2022

## 2022-07-20 ENCOUNTER — Encounter: Payer: Self-pay | Admitting: Nurse Practitioner

## 2022-07-20 ENCOUNTER — Ambulatory Visit (INDEPENDENT_AMBULATORY_CARE_PROVIDER_SITE_OTHER): Payer: 59 | Admitting: Nurse Practitioner

## 2022-07-20 ENCOUNTER — Ambulatory Visit: Payer: 59

## 2022-07-20 VITALS — BP 102/72 | HR 68 | Temp 98.2°F | Resp 16 | Ht 60.0 in | Wt 161.2 lb

## 2022-07-20 DIAGNOSIS — F332 Major depressive disorder, recurrent severe without psychotic features: Secondary | ICD-10-CM | POA: Insufficient documentation

## 2022-07-20 DIAGNOSIS — K769 Liver disease, unspecified: Secondary | ICD-10-CM | POA: Diagnosis not present

## 2022-07-20 DIAGNOSIS — Z23 Encounter for immunization: Secondary | ICD-10-CM | POA: Diagnosis not present

## 2022-07-20 DIAGNOSIS — G43009 Migraine without aura, not intractable, without status migrainosus: Secondary | ICD-10-CM

## 2022-07-20 DIAGNOSIS — F3342 Major depressive disorder, recurrent, in full remission: Secondary | ICD-10-CM

## 2022-07-20 DIAGNOSIS — Z0001 Encounter for general adult medical examination with abnormal findings: Secondary | ICD-10-CM

## 2022-07-20 DIAGNOSIS — N6341 Unspecified lump in right breast, subareolar: Secondary | ICD-10-CM

## 2022-07-20 LAB — COMPREHENSIVE METABOLIC PANEL
ALT: 22 U/L (ref 0–35)
AST: 19 U/L (ref 0–37)
Albumin: 4.1 g/dL (ref 3.5–5.2)
Alkaline Phosphatase: 82 U/L (ref 39–117)
BUN: 9 mg/dL (ref 6–23)
CO2: 26 mEq/L (ref 19–32)
Calcium: 9.7 mg/dL (ref 8.4–10.5)
Chloride: 106 mEq/L (ref 96–112)
Creatinine, Ser: 0.75 mg/dL (ref 0.40–1.20)
GFR: 111.89 mL/min (ref >60.00)
Glucose, Bld: 80 mg/dL (ref 70–99)
Potassium: 3.9 mEq/L (ref 3.5–5.1)
Sodium: 140 mEq/L (ref 135–145)
Total Bilirubin: 0.5 mg/dL (ref 0.2–1.2)
Total Protein: 7.5 g/dL (ref 6.0–8.3)

## 2022-07-20 NOTE — Assessment & Plan Note (Signed)
Denies any GI symptoms Repeat CT ABD/pelvis 06/08/2021: No suspicious hepatic lesion. 8 mm hypodense lesion in the right lobe of the liver on prior study is unchanged in size on today's study now demonstrating some peripheral nodular enhancement, most consistent with a hepatic hemangioma. Gallbladder surgically absent. No biliary ductal dilation.

## 2022-07-20 NOTE — Patient Instructions (Signed)
Go to lab Continue Heart healthy diet and daily exercise. Maintain current medications.  Preventive Care 52-24 Years Old, Female Preventive care refers to lifestyle choices and visits with your health care provider that can promote health and wellness. Preventive care visits are also called wellness exams. What can I expect for my preventive care visit? Counseling During your preventive care visit, your health care provider may ask about your: Medical history, including: Past medical problems. Family medical history. Pregnancy history. Current health, including: Menstrual cycle. Method of birth control. Emotional well-being. Home life and relationship well-being. Sexual activity and sexual health. Lifestyle, including: Alcohol, nicotine or tobacco, and drug use. Access to firearms. Diet, exercise, and sleep habits. Work and work Statistician. Sunscreen use. Safety issues such as seatbelt and bike helmet use. Physical exam Your health care provider may check your: Height and weight. These may be used to calculate your BMI (body mass index). BMI is a measurement that tells if you are at a healthy weight. Waist circumference. This measures the distance around your waistline. This measurement also tells if you are at a healthy weight and may help predict your risk of certain diseases, such as type 2 diabetes and high blood pressure. Heart rate and blood pressure. Body temperature. Skin for abnormal spots. What immunizations do I need?  Vaccines are usually given at various ages, according to a schedule. Your health care provider will recommend vaccines for you based on your age, medical history, and lifestyle or other factors, such as travel or where you work. What tests do I need? Screening Your health care provider may recommend screening tests for certain conditions. This may include: Pelvic exam and Pap test. Lipid and cholesterol levels. Diabetes screening. This is done by  checking your blood sugar (glucose) after you have not eaten for a while (fasting). Hepatitis B test. Hepatitis C test. HIV (human immunodeficiency virus) test. STI (sexually transmitted infection) testing, if you are at risk. BRCA-related cancer screening. This may be done if you have a family history of breast, ovarian, tubal, or peritoneal cancers. Talk with your health care provider about your test results, treatment options, and if necessary, the need for more tests. Follow these instructions at home: Eating and drinking  Eat a healthy diet that includes fresh fruits and vegetables, whole grains, lean protein, and low-fat dairy products. Take vitamin and mineral supplements as recommended by your health care provider. Do not drink alcohol if: Your health care provider tells you not to drink. You are pregnant, may be pregnant, or are planning to become pregnant. If you drink alcohol: Limit how much you have to 0-1 drink a day. Know how much alcohol is in your drink. In the [place] one drink equals one 12 oz bottle of beer (355 mL), one 5 oz glass of wine (148 mL), or one 1 oz glass of hard liquor (44 mL). Lifestyle Brush your teeth every morning and night with fluoride toothpaste. Floss one time each day. Exercise for at least 30 minutes 5 or more days each week. Do not use any products that contain nicotine or tobacco. These products include cigarettes, chewing tobacco, and vaping devices, such as e-cigarettes. If you need help quitting, ask your health care provider. Do not use drugs. If you are sexually active, practice safe sex. Use a condom or other form of protection to prevent STIs. If you do not wish to become pregnant, use a form of birth control. If you plan to become pregnant, see your health care  provider for a prepregnancy visit. Find healthy ways to manage stress, such as: Meditation, yoga, or listening to music. Journaling. Talking to a trusted person. Spending time  with friends and family. Minimize exposure to UV radiation to reduce your risk of skin cancer. Safety Always wear your seat belt while driving or riding in a vehicle. Do not drive: If you have been drinking alcohol. Do not ride with someone who has been drinking. If you have been using any mind-altering substances or drugs. While texting. When you are tired or distracted. Wear a helmet and other protective equipment during sports activities. If you have firearms in your house, make sure you follow all gun safety procedures. Seek help if you have been physically or sexually abused. What's next? Go to your health care provider once a year for an annual wellness visit. Ask your health care provider how often you should have your eyes and teeth checked. Stay up to date on all vaccines. This information is not intended to replace advice given to you by your health care provider. Make sure you discuss any questions you have with your health care provider. Document Revised: 10/27/2020 Document Reviewed: 10/27/2020 Elsevier Patient Education  [employer].

## 2022-07-20 NOTE — Assessment & Plan Note (Addendum)
Denies need for medication and therapy sessions. Reports improved mood with lifestyle modification and self care

## 2022-07-20 NOTE — Assessment & Plan Note (Signed)
Improved headaches with diet modification

## 2022-07-20 NOTE — Assessment & Plan Note (Addendum)
Repeat US 06/2021: Stable benign right breast mass at 9:30, 5 cm from the nipple.  Negative genetic test 07/2021 Advised to continue monthly self breast exam.

## 2022-07-20 NOTE — Progress Notes (Signed)
Complete physical exam  Patient: Rebecca Rocha   DOB: [birth year]   21 y.o. Female  MRN: [id number] Visit Date: 07/20/2022  Subjective:    Chief Complaint  Patient presents with   Annual Exam    Pt would like a referral to a nutritionist.    Rebecca Rocha is a 22 y.o. female who presents today for a complete physical exam. She reports consuming a low fat and low sodium diet.  Walking and cardio exercise 4x/week  She generally feels well. She reports sleeping well. She does not have additional problems to discuss today.  Vision:Yes Dental:Yes STD Screen:No  BP Readings from Last 3 Encounters:  07/20/22 102/72  07/18/22 104/72  03/16/22 122/74   Wt Readings from Last 3 Encounters:  07/20/22 161 lb 3.2 oz (73.1 kg)  03/16/22 162 lb 3.2 oz (73.6 kg)  02/22/22 157 lb 12.8 oz (71.6 kg)   Most recent fall risk assessment:    07/20/2022    2:12 PM  [place] in the past year? 0  Number falls in past yr: 0  Injury with Fall? 0  Risk for fall due to : No Fall Risks   Depression screen:Yes - No Depression  Most recent depression screenings:    07/20/2022    2:12 PM 07/20/2022    1:57 PM  PHQ 2/9 Scores  PHQ - 2 Score 2 1  PHQ- 9 Score 7 9    HPI  Depression, recurrent (HCC) Denies need for medication and therapy sessions. Reports improved mood with lifestyle modification and self care  Migraine Improved headaches with diet modification  Hepatic lesion Denies any GI symptoms Repeat CT ABD/pelvis 06/08/2021: No suspicious hepatic lesion. 8 mm hypodense lesion in the right lobe of the liver on prior study is unchanged in size on today's study now demonstrating some peripheral nodular enhancement, most consistent with a hepatic hemangioma. Gallbladder surgically absent. No biliary ductal dilation.  Breast mass, right Repeat US 06/2021: Stable benign right breast mass at 9:30, 5 cm from the nipple.  Negative genetic test 07/2021 Advised to  continue monthly self breast exam.   Past Medical History:  Diagnosis Date   Acute calculous cholecystitis 11/26/2019   Allergy    Anxiety    Asthma    Chronic headache disorder    Chronic upper back pain    Depression    Menstrual migraine    Panic attack    Sinusitis    Sleep apnea    Sore of lip 11/24/2021   Syncope and collapse    Vertigo of central origin, unspecified ear    Past Surgical History:  Procedure Laterality Date   CHOLECYSTECTOMY N/A 11/26/2019   Procedure: LAPAROSCOPIC CHOLECYSTECTOMY;  Surgeon: Kinsinger, Arta Bruce, MD;  Location: [facility];  Service: General;  Laterality: N/A;   Social History   Socioeconomic History   Marital status: Married    Spouse name: Not on file   Number of children: 0   Years of education: Not on file   Highest education level: Not on file  Occupational History   Not on file  Tobacco Use   Smoking status: Never   Smokeless tobacco: Never  Vaping Use   Vaping Use: Never used  Substance and Sexual Activity   Alcohol use: Yes    Alcohol/week: 2.0 standard drinks of alcohol    Types: 1 Cans of beer, 1 Shots of liquor per week    Comment: occasionally   Drug use: No  Sexual activity: Yes    Partners: Male    Birth control/protection: Injection    Comment: 1st intercourse- 15, partners- 67, current partnER- 4 YRS  Other Topics Concern   Not on file  Social History Narrative   Ship broker at [employer] Strain: Not on file  Food Insecurity: Not on file  Transportation Needs: Not on file  Physical Activity: Not on file  Stress: Not on file  Social Connections: Not on file  Intimate Partner Violence: Not on file   Family Status  Relation Name Status   Mother [place] Alive   Father [place] Alive   Sister Estill [place] Alive   MGM  Alive   MGF  Alive   [place]   PGF Edgar  (Not Specified)   Neg Hx  (Not Specified)   Family History  Problem Relation  Age of Onset   Hypertension Mother    Liver disease Mother    Anxiety disorder Mother    Kidney disease Mother    Other Mother        Negative hereditary cancer genetic testing   Hypertension Father    Diabetes Father    Anxiety disorder Father    Anxiety disorder Sister    Depression Sister    Breast cancer Maternal Grandmother 78   Diabetes Paternal Grandmother    Hypertension Paternal Grandmother    High Cholesterol Paternal Grandmother    Cancer Paternal Grandfather    Diabetes Paternal Grandfather    Hypertension Paternal Grandfather    High Cholesterol Paternal Grandfather    Other Cousin        maternal second cousin, BRCA2+   Esophageal cancer Neg Hx    Rectal cancer Neg Hx    Stomach cancer Neg Hx    No Known Allergies  Patient Care Team: Jocabed Cheese, Charlene Brooke, NP as PCP - General (Internal Medicine) Evans Lance, MD as PCP - Electrophysiology (Cardiology)   Medications: Outpatient Medications Prior to Visit  Medication Sig   fluticasone (FLOVENT HFA) 110 MCG/ACT inhaler Inhale 1 puff into the lungs 2 (two) times daily. Rinse mouth after each use   ondansetron (ZOFRAN-ODT) 4 MG disintegrating tablet Take 1 tablet (4 mg total) by mouth every 8 (eight) hours as needed for nausea or vomiting.   albuterol (PROVENTIL) (2.5 MG/3ML) 0.083% nebulizer solution Take 3 mLs (2.5 mg total) by nebulization every 6 (six) hours as needed for wheezing or shortness of breath.   albuterol (VENTOLIN HFA) 108 (90 Base) MCG/ACT inhaler INHALE 1 TO 2 PUFFS BY MOUTH EVERY 6 HOURS AS NEEDED FOR WHEEZING OR SHORTNESS OF BREATH   [DISCONTINUED] buPROPion (WELLBUTRIN SR) 150 MG 12 hr tablet Take 1 tablet (150 mg total) by mouth 2 (two) times daily. (Patient not taking: Reported on 07/20/2022)   [DISCONTINUED] cyclobenzaprine (FLEXERIL) 10 MG tablet Take 0.5-1 tablets (5-10 mg total) by mouth 3 (three) times daily as needed. (Patient not taking: Reported on 07/20/2022)   [DISCONTINUED] gabapentin  (NEURONTIN) 100 MG capsule TAKE 1 CAPSULE AT BEDTIME X1 WEEK THEN 1 CAPSULE TWICE DAILY X2 WEEKS THE 1 CAPSULE EVERY MORNING AND 2 CAPSULES AT BEDTIME ORALLY (Patient not taking: Reported on 07/20/2022)   [DISCONTINUED] hyoscyamine (ANASPAZ) 0.125 MG TBDP disintergrating tablet Place 1 tablet (0.125 mg total) under the tongue every 6 (six) hours as needed for cramping. (Patient not taking: Reported on 07/20/2022)   [DISCONTINUED] medroxyPROGESTERone (DEPO-PROVERA) 150 MG/ML injection Inject 150  mg into the muscle every 3 (three) months. (Patient not taking: Reported on 07/20/2022)   [DISCONTINUED] meloxicam (MOBIC) 15 MG tablet Take 1 tablet (15 mg total) by mouth daily. (Patient not taking: Reported on 07/20/2022)   [DISCONTINUED] pantoprazole (PROTONIX) 40 MG tablet Take 1 tablet (40 mg total) by mouth daily. (Patient not taking: Reported on 07/20/2022)   [DISCONTINUED] rizatriptan (MAXALT) 5 MG tablet Take 1 tablet (5 mg total) by mouth as needed for migraine. May repeat in 2 hours if needed. No more than '30mg'$  per day (Patient not taking: Reported on 07/20/2022)   [DISCONTINUED] terbinafine (LAMISIL) 250 MG tablet Take 1 tablet (250 mg total) by mouth daily. (Patient not taking: Reported on 07/20/2022)   [DISCONTINUED] valACYclovir (VALTREX) 500 MG tablet Patient reports using medication prn (Patient not taking: Reported on 07/20/2022)   No facility-administered medications prior to visit.    Review of Systems  Constitutional:  Negative for activity change, appetite change and unexpected weight change.  Respiratory: Negative.    Cardiovascular: Negative.   Gastrointestinal: Negative.   Endocrine: Negative for cold intolerance and heat intolerance.  Genitourinary: Negative.   Musculoskeletal: Negative.   Skin: Negative.   Neurological: Negative.   Hematological: Negative.   Psychiatric/Behavioral:  Negative for behavioral problems, decreased concentration, dysphoric mood, hallucinations, self-injury, sleep  disturbance and suicidal ideas. The patient is not nervous/anxious.    Last CBC Lab Results  Component Value Date   WBC 6.1 12/17/2021   HGB 12.8 12/17/2021   HCT 39.8 12/17/2021   MCV 93.2 12/17/2021   MCH 30.0 12/17/2021   RDW 11.9 12/17/2021   PLT 332 [id number]   Last metabolic panel Lab Results  Component Value Date   GLUCOSE 97 12/17/2021   NA 136 12/17/2021   K 3.3 (L) 12/17/2021   CL 105 12/17/2021   CO2 25 12/17/2021   BUN 7 12/17/2021   CREATININE 0.80 12/17/2021   GFRNONAA >60 12/17/2021   CALCIUM 8.7 (L) 12/17/2021   PROT 7.7 05/31/2021   ALBUMIN 4.2 05/31/2021   BILITOT 0.5 05/31/2021   ALKPHOS 61 05/31/2021   AST 16 05/31/2021   ALT 16 05/31/2021   ANIONGAP 6 12/17/2021   Last thyroid functions Lab Results  Component Value Date   TSH 0.42 07/07/2021        Objective:  BP 102/72 (BP Location: Left Arm, Patient Position: Sitting, Cuff Size: Normal)   Pulse 68   Temp 98.2 F (36.8 C) (Temporal)   Resp 16   Ht 5' (1.524 m)   Wt 161 lb 3.2 oz (73.1 kg)   BMI 31.48 kg/m     Physical Exam Vitals and nursing note reviewed.  Constitutional:      General: She is not in acute distress. HENT:     Right Ear: Tympanic membrane, ear canal and external ear normal.     Left Ear: Tympanic membrane, ear canal and external ear normal.     Nose: Nose normal.  Eyes:     Extraocular Movements: Extraocular movements intact.     Conjunctiva/sclera: Conjunctivae normal.     Pupils: Pupils are equal, round, and reactive to light.  Neck:     Thyroid: No thyroid mass, thyromegaly or thyroid tenderness.  Cardiovascular:     Rate and Rhythm: Normal rate and regular rhythm.     Pulses: Normal pulses.     Heart sounds: Normal heart sounds.  Pulmonary:     Effort: Pulmonary effort is normal.     Breath sounds: Normal  breath sounds.  Abdominal:     General: Bowel sounds are normal.     Palpations: Abdomen is soft.  Musculoskeletal:        General: Normal range  of motion.     Cervical back: Normal range of motion and neck supple.     Right lower leg: No edema.     Left lower leg: No edema.  Lymphadenopathy:     Cervical: No cervical adenopathy.  Skin:    General: Skin is warm and dry.  Neurological:     Mental Status: She is alert and oriented to person, place, and time.     Cranial Nerves: No cranial nerve deficit.  Psychiatric:        Mood and Affect: Mood normal.        Behavior: Behavior normal.        Thought Content: Thought content normal.      No results found for any visits on 07/20/22.    Assessment & Plan:    Routine Health Maintenance and Physical Exam  Immunization History  Administered Date(s) Administered   HPV 9-valent 07/07/2021, 10/17/2021, 07/20/2022   Influenza Inj Mdck Quad Pf 02/07/2018, 02/08/2022   Influenza,inj,Quad PF,6+ Mos 03/04/2020   Influenza-Unspecified 01/27/2021   PFIZER(Purple Top)SARS-COV-2 Vaccination 12/09/2019, 01/02/2020   PPD Test 04/17/2018, 10/02/2019   Tdap 04/17/2018, 06/17/2019   Health Maintenance  Topic Date Due   CHLAMYDIA SCREENING  12/18/2022   PAP-Cervical Cytology Screening  10/15/2023   PAP SMEAR-Modifier  10/15/2023   DTaP/Tdap/Td (3 - Td or Tdap) 06/16/2029   INFLUENZA VACCINE  Completed   HPV VACCINES  Completed   Hepatitis C Screening  Completed   HIV Screening  Completed   COVID-19 Vaccine  Discontinued   Discussed health benefits of physical activity, and encouraged her to engage in regular exercise appropriate for her age and condition.  Problem List Items Addressed This Visit       Cardiovascular and Mediastinum   Migraine    Improved headaches with diet modification        Digestive   Hepatic lesion    Denies any GI symptoms Repeat CT ABD/pelvis 06/08/2021: No suspicious hepatic lesion. 8 mm hypodense lesion in the right lobe of the liver on prior study is unchanged in size on today's study now demonstrating some peripheral nodular enhancement, most  consistent with a hepatic hemangioma. Gallbladder surgically absent. No biliary ductal dilation.        Other   Breast mass, right    Repeat US 06/2021: Stable benign right breast mass at 9:30, 5 cm from the nipple.  Negative genetic test 07/2021 Advised to continue monthly self breast exam.      Depression, recurrent ([place])    Denies need for medication and therapy sessions. Reports improved mood with lifestyle modification and self care      Other Visit Diagnoses     Encounter for preventative adult health care exam with abnormal findings    -  Primary   Relevant Orders   Comprehensive metabolic panel   Immunization due       Relevant Orders   HPV 9-valent vaccine,Recombinat (Completed)      Return in about 1 year (around 07/20/2023) for CPE (fasting).     Wilfred Lacy, NP

## 2022-07-21 NOTE — Progress Notes (Signed)
Stable Follow instructions as discussed during office visit.

## 2022-08-23 ENCOUNTER — Telehealth: Payer: 59 | Admitting: Physician Assistant

## 2022-08-23 DIAGNOSIS — R112 Nausea with vomiting, unspecified: Secondary | ICD-10-CM

## 2022-08-23 MED ORDER — ONDANSETRON 4 MG PO TBDP: 4.0000 mg | ORAL_TABLET | Freq: Three times a day (TID) | ORAL | 0 refills | Status: DC | PRN

## 2022-08-23 NOTE — Progress Notes (Signed)
E-Visit for Nausea and Vomiting   We are sorry that you are not feeling well. Here is how we plan to help!  Based on what you have shared with me it looks like you have a Virus that is irritating your GI tract.  Vomiting is the forceful emptying of a portion of the stomach's content through the mouth.  Although nausea and vomiting can make you feel miserable, it's important to remember that these are not diseases, but rather symptoms of an underlying illness.  When we treat short term symptoms, we always caution that any symptoms that persist should be fully evaluated in a medical office.  I have prescribed a medication that will help alleviate your symptoms and allow you to stay hydrated:  Zofran 4 mg 1 tablet every 8 hours as needed for nausea and vomiting  HOME CARE: Drink clear liquids.  This is very important! Dehydration (the lack of fluid) can lead to a serious complication.  Start off with 1 tablespoon every 5 minutes for 8 hours. You may begin eating bland foods after 8 hours without vomiting.  Start with saltine crackers, white bread, rice, mashed potatoes, applesauce. After 48 hours on a bland diet, you may resume a normal diet. Try to go to sleep.  Sleep often empties the stomach and relieves the need to vomit.  GET HELP RIGHT AWAY IF:  Your symptoms do not improve or worsen within 2 days after treatment. You have a fever for over 3 days. You cannot keep down fluids after trying the medication.  MAKE SURE YOU:  Understand these instructions. Will watch your condition. Will get help right away if you are not doing well or get worse.    Thank you for choosing an e-visit.  Your e-visit answers were reviewed by a board certified advanced clinical practitioner to complete your personal care plan. Depending upon the condition, your plan could have included both over the counter or prescription medications.  Please review your pharmacy choice. Make sure the pharmacy is open so  you can pick up prescription now. If there is a problem, you may contact your provider through MyChart messaging and have the prescription routed to another pharmacy.  Your safety is important to us. If you have drug allergies check your prescription carefully.   For the next 24 hours you can use MyChart to ask questions about today's visit, request a non-urgent call back, or ask for a work or school excuse. You will get an email in the next two days asking about your experience. I hope that your e-visit has been valuable and will speed your recovery.  I have spent 5 minutes in review of e-visit questionnaire, review and updating patient chart, medical decision making and response to patient.   Mashawn Brazil M Toris Laverdiere, PA-C  

## 2022-09-21 IMAGING — CT CT ABD-PELV W/ CM
3 of 14 series · 12 of 46 positions shown, 17 images · IV contrast (Omnipaque)
Comparison: CT abdomen pelvis dated 11/25/2019 and chest radiograph
dated 03/13/2020.

CLINICAL DATA: Shortness of breath and nausea and vomiting, concern
for pulmonary embolism and bowel obstruction.

EXAM:
CT ANGIOGRAPHY CHEST
CT ABDOMEN AND PELVIS WITH CONTRAST
TECHNIQUE: Multidetector CT imaging of the chest was performed using the
standard protocol during bolus administration of intravenous
contrast. Multiplanar CT image reconstructions and MIPs were
obtained to evaluate the vascular anatomy. Multidetector CT imaging
of the abdomen and pelvis was performed using the standard protocol
during bolus administration of intravenous contrast.
CONTRAST:  100mL OMNIPAQUE IOHEXOL 350 MG/ML SOLN

[Series 7: pe thins · axial · 0.72mm/px · z∈[+916,+1123]mm · 7 of 291 slices shown]
[im 21/291  soft-tissue]
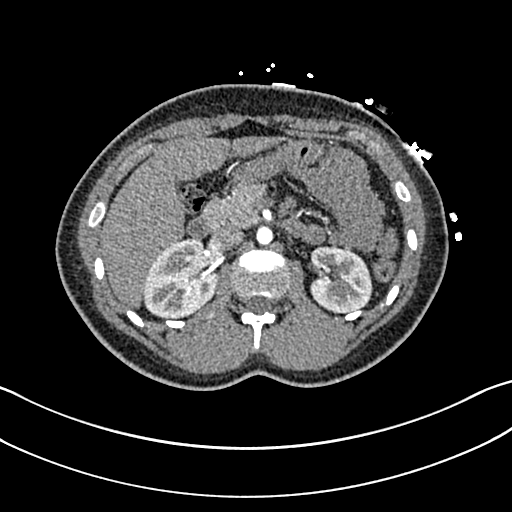
[im 63/291  soft-tissue]
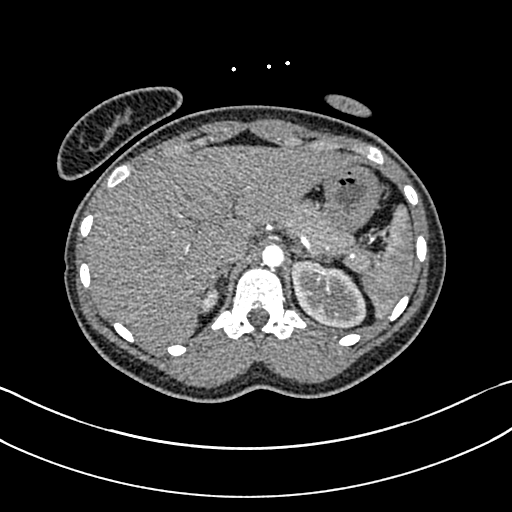
[im 104/291  soft-tissue]
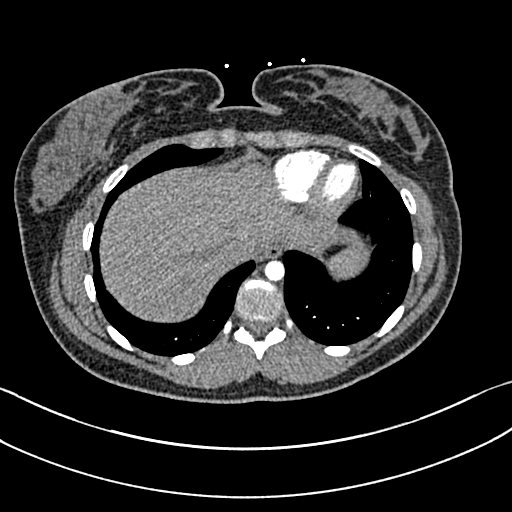
[im 125/291  soft-tissue]
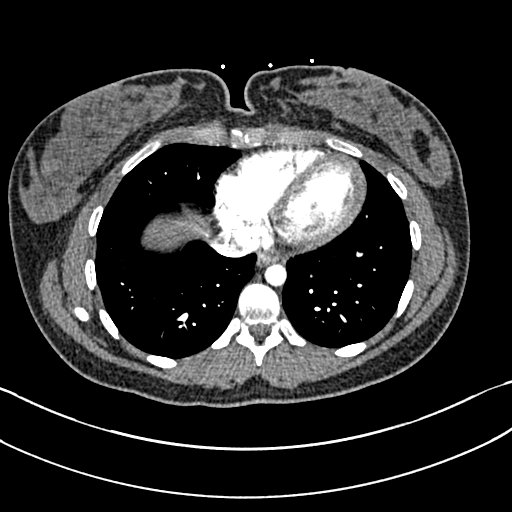
[im 166/291  soft-tissue]
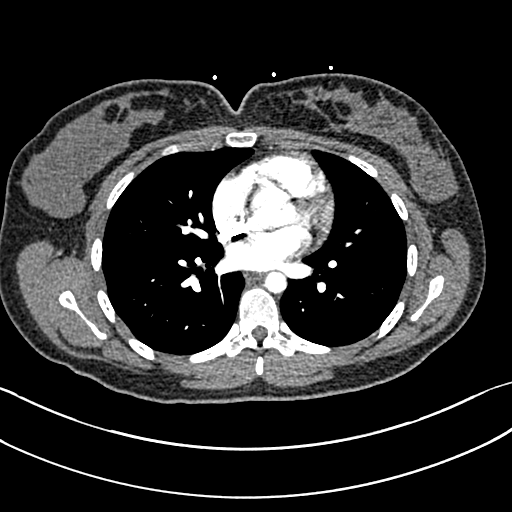
[im 187/291  soft-tissue]
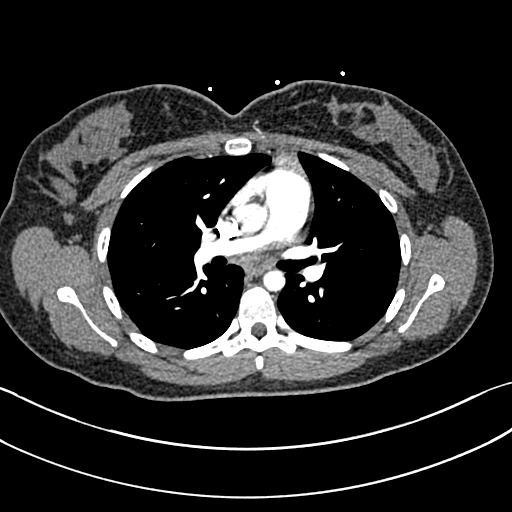
[im 228/291  soft-tissue]
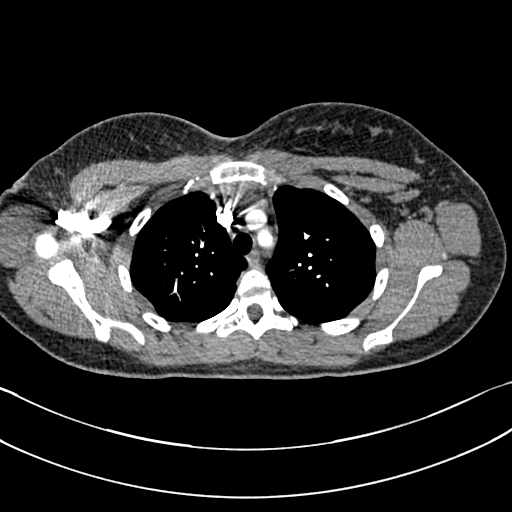

[Series 9: pe coronal mpr · coronal · 0.58mm/px · 2 of 128 slices shown, 3 images]
[im 43/128  soft-tissue]
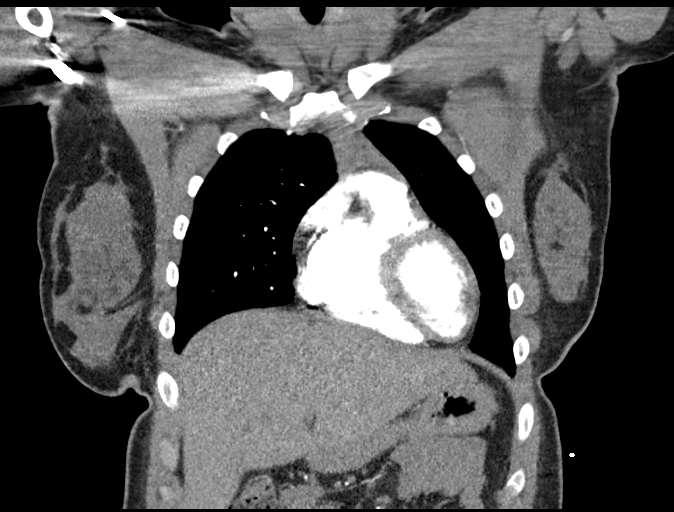
[im 43/128  bone]
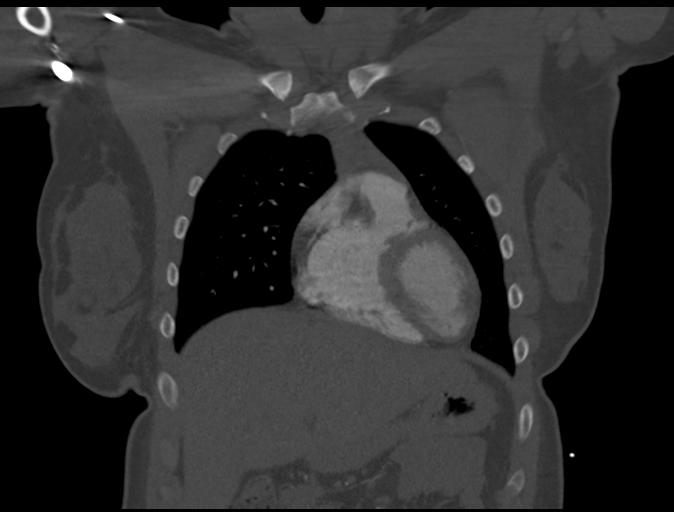
[im 85/128  soft-tissue]
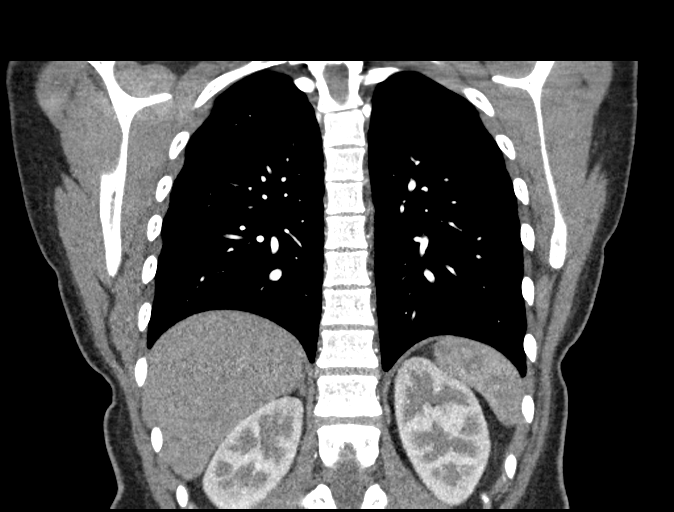

[Series 13: axial st · axial · 0.69mm/px · z∈[+710,+920]mm · 3 of 85 slices shown, 7 images]
[im 22/85  soft-tissue]
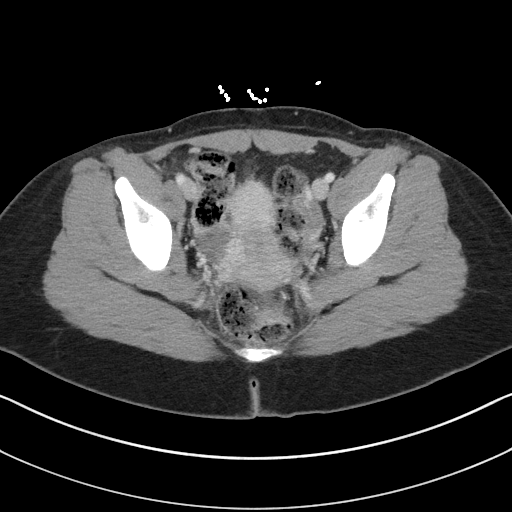
[im 22/85  lung]
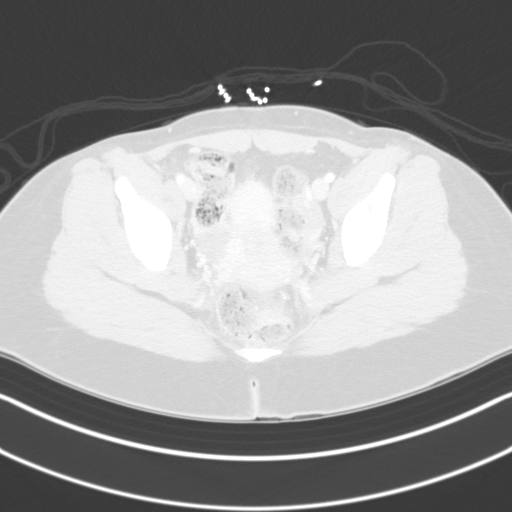
[im 22/85  bone]
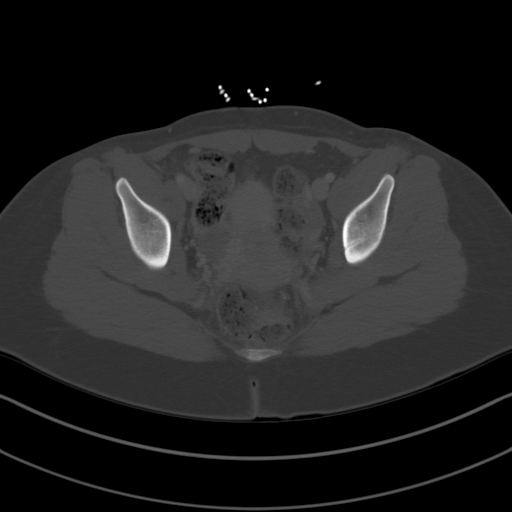
[im 43/85  soft-tissue]
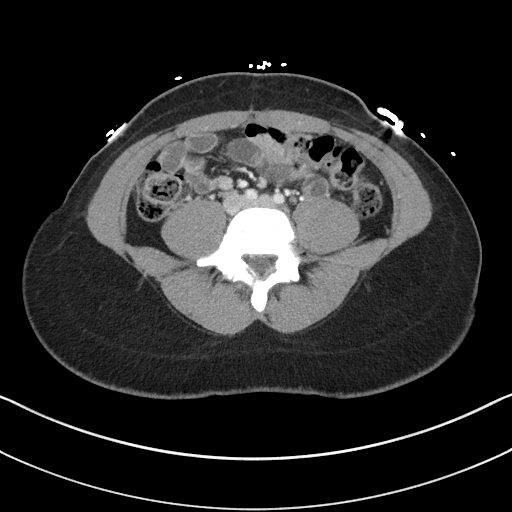
[im 43/85  lung]
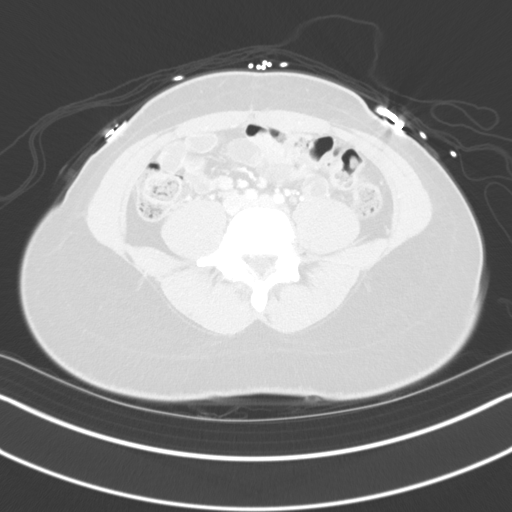
[im 64/85  soft-tissue]
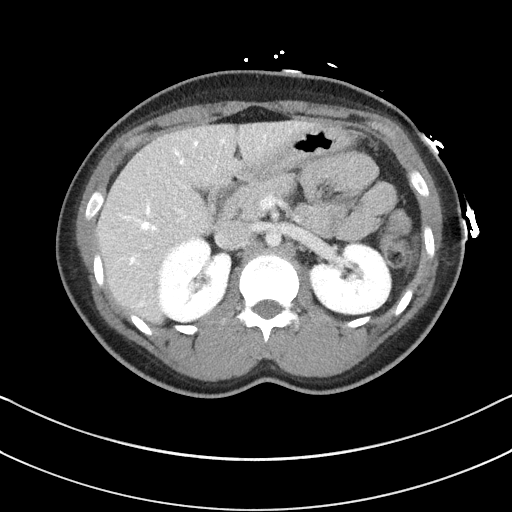
[im 64/85  lung]
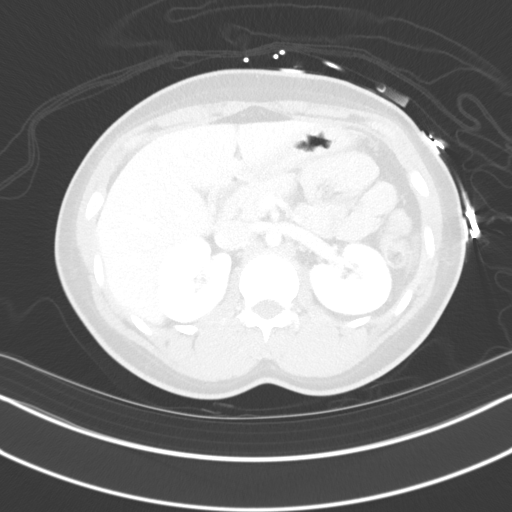

[12 of 46 positions shown; findings below may reference images not displayed]

FINDINGS: CTA CHEST FINDINGS

Cardiovascular: Satisfactory opacification of the pulmonary arteries
to the segmental level. No evidence of pulmonary embolism. Normal
heart size. No pericardial effusion.

Mediastinum/Nodes: No enlarged mediastinal, hilar, or axillary lymph
nodes. Thyroid gland, trachea, and esophagus demonstrate no
significant findings.

Lungs/Pleura: Lungs are clear. No pleural effusion or pneumothorax.

Musculoskeletal: No chest wall abnormality. No acute or significant
osseous findings.

Review of the MIP images confirms the above findings.

CT ABDOMEN and PELVIS FINDINGS

Hepatobiliary: A 7 mm hypoattenuating lesion in hepatic segment 5
(series 13, image 24) is noted. Status post cholecystectomy. No
biliary dilatation.

Pancreas: Unremarkable. No pancreatic ductal dilatation or
surrounding inflammatory changes.

Spleen: Normal in size without focal abnormality.

Adrenals/Urinary Tract: Adrenal glands are unremarkable. Kidneys are
normal, without renal calculi, focal lesion, or hydronephrosis.
Bladder is unremarkable.

Stomach/Bowel: Stomach is within normal limits. Appendix appears
normal. No evidence of bowel wall thickening, distention, or
inflammatory changes.

Vascular/Lymphatic: No significant vascular findings are present. No
enlarged abdominal or pelvic lymph nodes.

Reproductive: Uterus and bilateral adnexa are unremarkable.

Other: No abdominal wall hernia or abnormality. No abdominopelvic
ascites.

Musculoskeletal: No acute or significant osseous findings.

Review of the MIP images confirms the above findings.
IMPRESSION: 1. No evidence of pulmonary embolism.
2. No acute findings in the chest, abdomen or pelvis.
3. A 7 mm hypoattenuating lesion in hepatic segment 5 is likely
benign if the patient has no known malignancy or liver disease. If
the patient has a known malignancy or liver disease, follow-up MRI
in 3-6 months would be recommended. This recommendation follows ACR
consensus guidelines: Management of Incidental Liver Lesions on CT:
A White Paper of the ACR Incidental Findings Committee. [HOSPITAL] 3316; [id number].

## 2022-09-21 IMAGING — DX DG CHEST 1V PORT
1 series · 1 of 1 positions shown · non-contrast
Comparison: February 27, 2020

CLINICAL DATA: Shortness of breath

EXAM:
PORTABLE CHEST 1 VIEW

[chest ap]
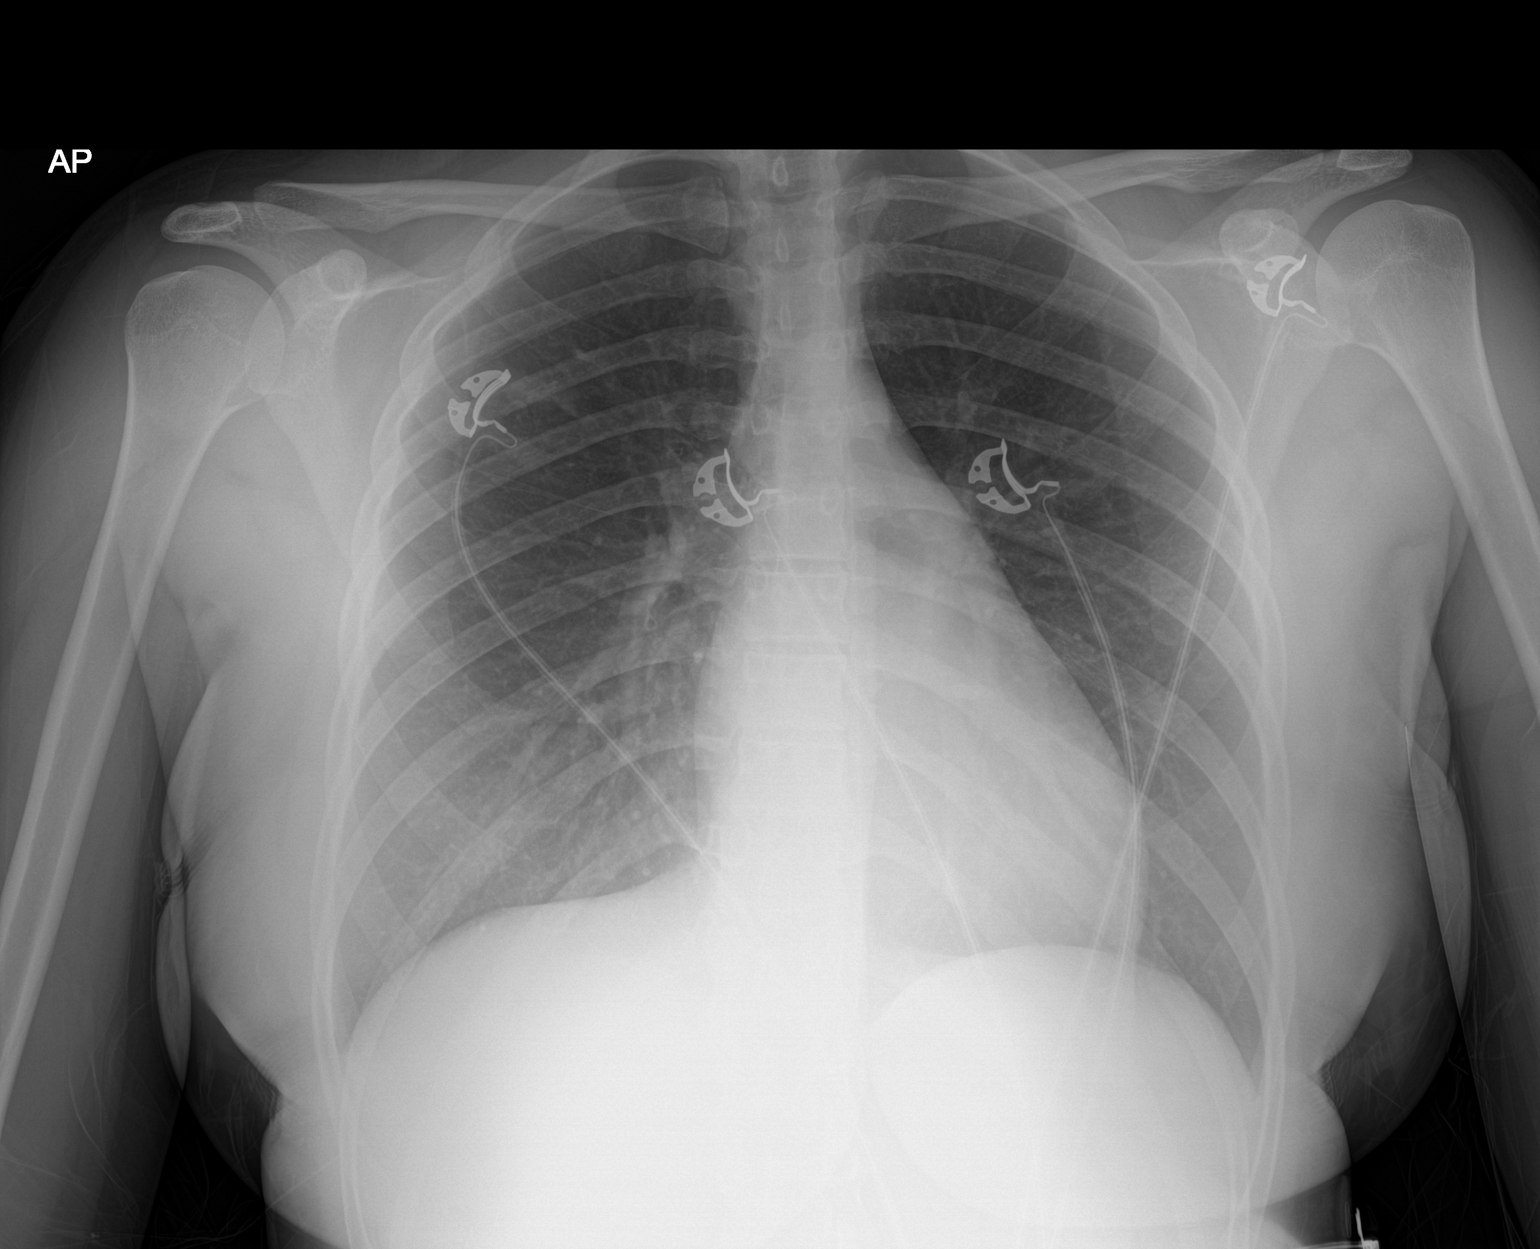

[1 of 1 positions shown; findings below may reference images not displayed]

FINDINGS: The heart size and mediastinal contours are within normal limits.
Both lungs are clear. The visualized skeletal structures are
unremarkable.
IMPRESSION: No active disease.

## 2022-10-18 ENCOUNTER — Ambulatory Visit: Payer: 59 | Admitting: Obstetrics & Gynecology

## 2022-10-19 ENCOUNTER — Ambulatory Visit: Payer: 59 | Admitting: Obstetrics & Gynecology

## 2022-10-31 ENCOUNTER — Encounter: Payer: Self-pay | Admitting: Nurse Practitioner

## 2022-10-31 ENCOUNTER — Ambulatory Visit (INDEPENDENT_AMBULATORY_CARE_PROVIDER_SITE_OTHER): Payer: 59 | Admitting: Nurse Practitioner

## 2022-10-31 VITALS — BP 90/64 | HR 54 | Temp 98.4°F | Resp 16 | Ht 60.0 in | Wt 149.2 lb

## 2022-10-31 DIAGNOSIS — K2950 Unspecified chronic gastritis without bleeding: Secondary | ICD-10-CM | POA: Diagnosis not present

## 2022-10-31 DIAGNOSIS — G8929 Other chronic pain: Secondary | ICD-10-CM | POA: Diagnosis not present

## 2022-10-31 DIAGNOSIS — M5441 Lumbago with sciatica, right side: Secondary | ICD-10-CM

## 2022-10-31 MED ORDER — PREDNISONE 20 MG PO TABS: ORAL_TABLET | ORAL | 0 refills | Status: AC

## 2022-10-31 MED ORDER — CYCLOBENZAPRINE HCL 5 MG PO TABS: 5.0000 mg | ORAL_TABLET | Freq: Every day | ORAL | 0 refills | Status: DC

## 2022-10-31 MED ORDER — OMEPRAZOLE 20 MG PO CPDR: 20.0000 mg | DELAYED_RELEASE_CAPSULE | Freq: Two times a day (BID) | ORAL | 0 refills | Status: DC

## 2022-10-31 NOTE — Patient Instructions (Signed)
Avoid NSAIDs-ibuprofen, naproxen, aspirin, excedrin, BC powder. Ok to use tylenol 500mg  every 8hrs as needed for pain Take oral prednisone with food Schedule appointment with GI [phone] You will be contacted to schedule appointment with PT  Managing Chronic Back Pain Chronic back pain is pain that lasts longer than 3 months. It often affects the lower back. It may feel like a muscle ache or a sharp, stabbing pain. It can be mild, moderate, or severe. There are things you can do to help manage your pain. See what works best for you. Your health care provider may also give you other instructions. What actions can I take to manage my chronic back pain? You may be given a treatment plan by your provider. Treatment often starts with rest and pain relief. It may also include: Physical therapy. These are exercises to help restore movement and strength to your back. Techniques to help you relax. Counseling or therapy. Cognitive behavioral therapy (CBT) is a form of therapy that helps you set goals and make changes. Acupuncture or massage therapy. Local electrical stimulation. Injections. You may be given medicines to numb an area or relieve pain. If other treatments do not help, you may need surgery. How to use body mechanics and posture to help with pain You can help relieve stress on your back with good posture and healthy body mechanics. Body mechanics are all the ways your body moves during the day. Posture is part of body mechanics. Good posture means: Your spine is in its correct S-curve, or neutral, position. Your shoulders are pulled back a bit. Your head is not tipped forward. To improve your posture and body mechanics, follow these guidelines. Standing  When standing, keep your feet about hip-width apart. Keep your knees slightly bent. Your ears, shoulders, and hips should line up. Your spine should be neutral. When you stand in one place for a long time, place one foot on a stable  object that is 2-4 inches (5-10 cm) high, such as a footstool. Sitting  When sitting, keep your feet flat on the floor. Use a footrest, if needed. Keep your thighs parallel to the floor. Try not to round your shoulders or tilt your head forward. When working at a desk or a computer: Position your desk so your hands are a little lower than your elbows. Slide your chair under your desk so you are close enough to have good posture. Position your monitor so you are looking straight ahead and do not have to tilt your head to view the screen. Lifting  Keep your feet shoulder-width apart. Tighten the muscles of your abdomen. Bend your knees and hips. Keep your spine neutral. Lift using the strength of your legs, not your back. Do not lock your knees straight out. Ask for help to lift heavy or awkward objects. Resting  Do not lie down in a way that causes pain. If you have pain when you sit, bend, stoop, or squat, lie in a way that your body does not bend much. Try not to curl up on your side with your arms and knees near your chest (fetal position). If it hurts to stand for a long time or reach with your arms, lie with your spine neutral and knees bent slightly. Try lying: On your side with a pillow between your knees. On your back with a pillow under your knees. How to recognize changes in your chronic back pain Let your provider know if your pain gets worse or does  not get better with treatment. Your back pain may be getting worse if you have pain that: Starts to cause problems with your posture. Gets worse when you sit, stand, walk, bend, or lift things. Happens when you are active, at rest, or both. Makes it hard for you to move around (limits mobility). Occurs with fever, weight loss, or trouble peeing (urinating). Causes numbness and tingling. Follow these instructions at home: Medicines You may need to take medicines for pain and inflammation. These may be taken by mouth or put on the  skin. You may also be given muscle relaxants. Take over-the-counter and prescription medicines only as told by your provider. Ask your provider if the medicine prescribed to you: Requires you to avoid driving or using machinery. Can cause constipation. You may need to take these actions to prevent or treat constipation: Drink enough fluid to keep your pee (urine) pale yellow. Take over-the-counter or prescription medicines. Eat foods that are high in fiber, such as beans, whole grains, and fresh fruits and vegetables. Limit foods that are high in fat and processed sugars, such as fried or sweet foods. Lifestyle Do not use any products that contain nicotine or tobacco. These products include cigarettes, chewing tobacco, and vaping devices, such as e-cigarettes. If you need help quitting, ask your provider. Eat a healthy diet. Eat lots of vegetables, fruits, fish, and lean meats. Work with your provider to stay at a healthy weight. General instructions Get regular exercise as told. Exercise can help with flexibility and strength. If physical therapy was prescribed, do exercises as told by your provider. Use ice or heat therapy as told by your provider. Where can I get support? Think about joining a support group for people with chronic back pain. You can find some groups at: Pain Connection Program: painconnection.org The American Chronic Pain Association: acpanow.com Contact a health care provider if: Your pain does not get better with rest or medicine. You have new pain. You have a fever. You lose weight quickly. You have trouble doing your normal activities. You feel weak or numb in one or both of your legs or feet. Get help right away if: You are not able to control when you pee or poop. You have severe back pain and: Nausea or vomiting. Pain in your chest or abdomen. Shortness of breath. You faint. These symptoms may be an emergency. Get help right away. Call 911. Do not wait  to see if the symptoms will go away. Do not drive yourself to the hospital. This information is not intended to replace advice given to you by your health care provider. Make sure you discuss any questions you have with your health care provider. Document Revised: 12/19/2021 Document Reviewed: 12/19/2021 Elsevier Patient Education  2024 [employer].

## 2022-10-31 NOTE — Assessment & Plan Note (Addendum)
Acute on chronic back pain.  Pain radiates to right leg, worse with walking,  Works from home, repetitive lifting of about 25-30pounds. Denies any weakness, paresthesia, change in GU/GI function. Minimal improvement with NSAIDs, tylenol and muscle relaxant. No previous back injury or surgery.  Avoid NSAIDs due to chronic gastritic with nausea Start oral prednisone  Resume tylenol and muscle relaxant Advised to maintain body mechanics when moving heavy objects Referred for outpatient PT F/up in 2months Consider MRI lumbar spine if no improvement with above treatement

## 2022-10-31 NOTE — Assessment & Plan Note (Signed)
Associated with chronic intermittent nausea. Worse with food intake Had eval by GI in 2023: CT ABDOMEN/pelvis, EGD and colonoscopy completed. Negative H.-pylori Diagnosed with IBS-D and gastritis. She denies use of marijuana, but reports second hand exposure (husband smokes in their home). Reports diet modifications: avoids soda, diary and red meat. Use of ibuprofen to manage chronic back pain  Sent omeprazole 20mg  BID Advised to avoids NSAIDs and to schedule f/up appointment with GI

## 2022-10-31 NOTE — Progress Notes (Signed)
Established Patient Visit  Patient: Rebecca Rocha   DOB: 29-Dec-2000   22 y.o. Female  MRN: [id number] Visit Date: 10/31/2022  Subjective:    Chief Complaint  Patient presents with   Pain    Pt c/o of pain starting from the back of her neck and radiates down to lower extremities    Nausea    Pt c/o nausea and dizziness. Took two pregnancy tests and they were negative    HPI Chronic gastritis Associated with chronic intermittent nausea. Worse with food intake Had eval by GI in 2023: CT ABDOMEN/pelvis, EGD and colonoscopy completed. Negative H.-pylori Diagnosed with IBS-D and gastritis. She denies use of marijuana, but reports second hand exposure (husband smokes in their home). Reports diet modifications: avoids soda, diary and red meat. Use of ibuprofen to manage chronic back pain  Sent omeprazole 20mg  BID Advised to avoids NSAIDs and to schedule f/up appointment with GI  Chronic right-sided low back pain Acute on chronic back pain.  Pain radiates to right leg, worse with walking,  Works from home, repetitive lifting of about 25-30pounds. Denies any weakness, paresthesia, change in GU/GI function. Minimal improvement with NSAIDs, tylenol and muscle relaxant. No previous back injury or surgery.  Avoid NSAIDs due to chronic gastritic with nausea Start oral prednisone  Resume tylenol and muscle relaxant Advised to maintain body mechanics when moving heavy objects Referred for outpatient PT F/up in 2months Consider MRI lumbar spine if no improvement with above treatement  Reviewed medical, surgical, and social history today  Medications: Outpatient Medications Prior to Visit  Medication Sig   albuterol (PROVENTIL) (2.5 MG/3ML) 0.083% nebulizer solution Take 3 mLs (2.5 mg total) by nebulization every 6 (six) hours as needed for wheezing or shortness of breath.   albuterol (VENTOLIN HFA) 108 (90 Base) MCG/ACT inhaler INHALE 1 TO 2 PUFFS BY MOUTH EVERY  6 HOURS AS NEEDED FOR WHEEZING OR SHORTNESS OF BREATH   fluticasone (FLOVENT HFA) 110 MCG/ACT inhaler Inhale 1 puff into the lungs 2 (two) times daily. Rinse mouth after each use   ondansetron (ZOFRAN-ODT) 4 MG disintegrating tablet Take 1 tablet (4 mg total) by mouth every 8 (eight) hours as needed.   No facility-administered medications prior to visit.   Reviewed past medical and social history.   ROS per HPI above      Objective:  BP 90/64 (BP Location: Left Arm, Patient Position: Sitting, Cuff Size: Large)   Pulse (!) 54   Temp 98.4 F (36.9 C) (Temporal)   Resp 16   Ht 5' (1.524 m)   Wt 149 lb 3.2 oz (67.7 kg)   LMP  (LMP Unknown)   SpO2 99%   BMI 29.14 kg/m      Physical Exam Vitals reviewed.  Cardiovascular:     Rate and Rhythm: Normal rate.     Pulses: Normal pulses.  Pulmonary:     Effort: Pulmonary effort is normal.  Abdominal:     General: Bowel sounds are normal.     Palpations: Abdomen is soft.  Musculoskeletal:     Thoracic back: Normal.     Lumbar back: Tenderness present. No swelling, signs of trauma, spasms or bony tenderness. Normal range of motion. Negative right straight leg raise test and negative left straight leg raise test.     Right hip: Normal.     Left hip: Normal.     Right upper leg: Normal.  Left upper leg: Normal.  Neurological:     Mental Status: She is alert and oriented to person, place, and time.     No results found for any visits on 10/31/22.    Assessment & Plan:    Problem List Items Addressed This Visit       Digestive   Chronic gastritis    Associated with chronic intermittent nausea. Worse with food intake Had eval by GI in 2023: CT ABDOMEN/pelvis, EGD and colonoscopy completed. Negative H.-pylori Diagnosed with IBS-D and gastritis. She denies use of marijuana, but reports second hand exposure (husband smokes in their home). Reports diet modifications: avoids soda, diary and red meat. Use of ibuprofen to manage  chronic back pain  Sent omeprazole 20mg  BID Advised to avoids NSAIDs and to schedule f/up appointment with GI      Relevant Medications   omeprazole (PRILOSEC) 20 MG capsule     Other   Chronic right-sided low back pain - Primary    Acute on chronic back pain.  Pain radiates to right leg, worse with walking,  Works from home, repetitive lifting of about 25-30pounds. Denies any weakness, paresthesia, change in GU/GI function. Minimal improvement with NSAIDs, tylenol and muscle relaxant. No previous back injury or surgery.  Avoid NSAIDs due to chronic gastritic with nausea Start oral prednisone  Resume tylenol and muscle relaxant Advised to maintain body mechanics when moving heavy objects Referred for outpatient PT F/up in 2months Consider MRI lumbar spine if no improvement with above treatement      Relevant Medications   predniSONE (DELTASONE) 20 MG tablet   cyclobenzaprine (FLEXERIL) 5 MG tablet   Other Relevant Orders   Ambulatory referral to Physical Therapy   Return in about 2 months (around 12/31/2022) for chronic back pain.     Alysia Penna, NP

## 2022-11-08 NOTE — Therapy (Signed)
OUTPATIENT PHYSICAL THERAPY THORACOLUMBAR EVALUATION   Patient Name: Rebecca Rocha MRN: [id number] DOB:2000/10/17, 22 y.o., female Today's Date: 11/09/2022  END OF SESSION:  PT End of Session - 11/09/22 1315     Visit Number 1    Date for PT Re-Evaluation 01/18/23    Authorization Type [employer]    PT Start Time 1315    PT Stop Time 1400    PT Time Calculation (min) 45 min    Activity Tolerance Patient tolerated treatment well             Past Medical History:  Diagnosis Date   Acute calculous cholecystitis 11/26/2019   Allergy    Anxiety    Asthma    Chronic headache disorder    Chronic upper back pain    Depression    Menstrual migraine    Panic attack    Sinusitis    Sleep apnea    Sore of lip 11/24/2021   Syncope and collapse    Vertigo of central origin, unspecified ear    Past Surgical History:  Procedure Laterality Date   CHOLECYSTECTOMY N/A 11/26/2019   Procedure: LAPAROSCOPIC CHOLECYSTECTOMY;  Surgeon: Rodman Pickle, MD;  Location: [facility];  Service: General;  Laterality: N/A;   Patient Active Problem List   Diagnosis Date Noted   Chronic gastritis 10/31/2022   History of self mutilation 09/19/2021   Genetic testing 08/25/2021   Nausea without vomiting 07/07/2021   Generalized abdominal pain 07/07/2021   Chronic myofascial pain 04/15/2021   Breast mass, right 10/08/2020   Depression, recurrent (HCC) 08/20/2020   Insomnia 04/07/2020   Hepatic lesion 04/07/2020   S/P laparoscopic cholecystectomy 12/08/2019   Migraine 12/08/2019   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   Chronic right-sided low back pain 09/18/2019   Allergy to environmental factors 10/27/2016   Asthma 10/27/2016    PCP: Alysia Penna  REFERRING PROVIDER: Alysia Penna   REFERRING DIAG:  [phone] (ICD-10-CM) - Chronic right-sided low back pain with right-sided sciatica    Rationale for Evaluation and Treatment: Rehabilitation  THERAPY DIAG:  Radiculopathy, lumbar  region  Other low back pain  Bilateral low back pain with right-sided sciatica, unspecified chronicity  Muscle weakness (generalized)  ONSET DATE: 10/31/22   SUBJECTIVE:                                                                                                                                                                                           SUBJECTIVE STATEMENT: I have been getting lots of back pain and it going in to my R side. I do have a history of back from playing sports.   PERTINENT  HISTORY:  Cholecystectomy 2021  PAIN:  Are you having pain? Yes: NPRS scale: 9.5/10 Pain location: back and down into R  Pain description: pinching pain, sharp, shooting  Aggravating factors: sitting too long, walking too far  Relieving factors: exercises (3x a week, weights, walking, stretching)   PRECAUTIONS: None  WEIGHT BEARING RESTRICTIONS: No  FALLS:  Has patient fallen in last 6 months? 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   PLOF: Independent  PATIENT GOALS: to get the pain away    OBJECTIVE:   PATIENT SURVEYS:  FOTO 48  SCREENING FOR RED FLAGS: Bowel or bladder incontinence: No Spinal tumors: No Cauda equina syndrome: No Compression fracture: No Abdominal aneurysm: No  COGNITION: Overall cognitive status: Within functional limits for tasks assessed     SENSATION: WFL  MUSCLE LENGTH: Hamstrings: some mild tightness and pain in RLE   PALPATION: TTP L4-L5 an R SIJ   LUMBAR ROM:   AROM eval  Flexion WNL  Extension WNL with pain  Right lateral flexion Fib head with pain  Left lateral flexion Fib head with pain  Right rotation 75% with pain  Left rotation 75% with pain   (Blank rows = not tested)  LOWER EXTREMITY ROM:  grossly WFL   LOWER EXTREMITY MMT:  grossly 5/5 BLE   LUMBAR SPECIAL TESTS:  Straight leg raise test: Positive and FABER test:  Positive   TODAY'S TREATMENT:                                                                                                                              DATE: EVAL- 11/09/22    PATIENT EDUCATION:  Education details: POC and HEP Person educated: Patient Education method: Explanation Education comprehension: verbalized understanding  HOME EXERCISE PROGRAM: Access Code: [email or web address] URL: [email or web address] Date: 11/09/2022 Prepared by: Cassie Freer  Exercises - Supine Lower Trunk Rotation  - 1 x daily - 7 x weekly - 2 sets - 10 reps - Supine Bridge  - 1 x daily - 7 x weekly - 2 sets - 10 reps - Clamshell with Resistance  - 1 x daily - 7 x weekly - 2 sets - 10 reps - Supine Piriformis Stretch  - 1 x daily - 7 x weekly - 2 sets - 30 hold  ASSESSMENT:  CLINICAL IMPRESSION: Patient is a 22 y.o. female who was seen today for physical therapy evaluation and treatment for LBP. She reports her pain has been ongoing for about 4 years now. The pain runs across her low back and into her R leg. She sometimes gets N/T and sharp shooting pains. Patient reports she is exercising regularly about 3x/week doing weights, cardio, and stretching. She is TTP at L4-L5 and her R SIJ and has tightness in her R glutes, piriformis, and hips. Patient presents with good overall strength and lumbar ROM but has pain that is limiting her. She will  benefit from skilled PT to address her high pain levels to improve QOL.   OBJECTIVE IMPAIRMENTS: difficulty walking and pain.   ACTIVITY LIMITATIONS: stairs and locomotion level  REHAB POTENTIAL: Good  CLINICAL DECISION MAKING: Stable/uncomplicated  EVALUATION COMPLEXITY: Low  GOALS: Goals reviewed with patient? Yes  SHORT TERM GOALS: Target date: 12/14/22  Patient will be independent with initial HEP.  Goal status: INITIAL  2.  Patient will report centralization of radicular symptoms.  Baseline: radiates into RLE Goal status: INITIAL   LONG  TERM GOALS: Target date: 01/18/23  Patient will be independent with advanced/ongoing HEP to improve outcomes and carryover.  Goal status: INITIAL  2.  Patient will report 75% improvement in low back pain to improve QOL.  Baseline: 9.5/10 pain Goal status: INITIAL  3.  Patient will demonstrate full pain free lumbar ROM to perform ADLs.   Baseline: pain with movements, see chart Goal status: INITIAL  4.  Patient will tolerate 1 hour of (standing/sitting/walking) to perform ADLs without pain. Baseline: pain with prolonged sitting or walking  Goal status: INITIAL   PLAN:  PT FREQUENCY: 1-2x/week  PT DURATION: 10 weeks  PLANNED INTERVENTIONS: Therapeutic exercises, Therapeutic activity, Neuromuscular re-education, Balance training, Gait training, Patient/Family education, Self Care, Joint mobilization, Joint manipulation, Dry Needling, Electrical stimulation, Spinal manipulation, Spinal mobilization, Cryotherapy, Moist heat, Traction, Ionotophoresis 4mg /ml Dexamethasone, and Manual therapy.  PLAN FOR NEXT SESSION: start gym activities working on LB and R hip stretching and strengthening, can try traction if she continues to have radiating sx into RLE    [facility], PT 11/09/2022, 1:57 PM

## 2022-11-09 ENCOUNTER — Ambulatory Visit: Payer: 59 | Attending: Nurse Practitioner

## 2022-11-09 DIAGNOSIS — M5459 Other low back pain: Secondary | ICD-10-CM | POA: Insufficient documentation

## 2022-11-09 DIAGNOSIS — M6281 Muscle weakness (generalized): Secondary | ICD-10-CM | POA: Insufficient documentation

## 2022-11-09 DIAGNOSIS — M5416 Radiculopathy, lumbar region: Secondary | ICD-10-CM | POA: Insufficient documentation

## 2022-11-09 DIAGNOSIS — G8929 Other chronic pain: Secondary | ICD-10-CM | POA: Insufficient documentation

## 2022-11-09 DIAGNOSIS — M5441 Lumbago with sciatica, right side: Secondary | ICD-10-CM | POA: Insufficient documentation

## 2022-11-12 ENCOUNTER — Other Ambulatory Visit: Payer: Self-pay | Admitting: Nurse Practitioner

## 2022-11-12 DIAGNOSIS — K2950 Unspecified chronic gastritis without bleeding: Secondary | ICD-10-CM

## 2022-11-14 ENCOUNTER — Encounter: Payer: Self-pay | Admitting: Physical Therapy

## 2022-11-14 ENCOUNTER — Ambulatory Visit: Payer: 59 | Attending: Nurse Practitioner | Admitting: Physical Therapy

## 2022-11-14 DIAGNOSIS — M5459 Other low back pain: Secondary | ICD-10-CM | POA: Diagnosis present

## 2022-11-14 DIAGNOSIS — M6281 Muscle weakness (generalized): Secondary | ICD-10-CM

## 2022-11-14 DIAGNOSIS — M5441 Lumbago with sciatica, right side: Secondary | ICD-10-CM | POA: Diagnosis present

## 2022-11-14 DIAGNOSIS — M5416 Radiculopathy, lumbar region: Secondary | ICD-10-CM | POA: Diagnosis present

## 2022-11-14 NOTE — Therapy (Signed)
OUTPATIENT PHYSICAL THERAPY THORACOLUMBAR EVALUATION   Patient Name: Rebecca Rocha MRN: [id number] DOB:20-Feb-2001, 22 y.o., female Today's Date: 11/14/2022  END OF SESSION:  PT End of Session - 11/14/22 1601     Visit Number 2    Date for PT Re-Evaluation 01/18/23    PT Start Time 1600    PT Stop Time 1645    PT Time Calculation (min) 45 min    Activity Tolerance Patient tolerated treatment well    Behavior During Therapy [facility] for tasks assessed/performed             Past Medical History:  Diagnosis Date   Acute calculous cholecystitis 11/26/2019   Allergy    Anxiety    Asthma    Chronic headache disorder    Chronic upper back pain    Depression    Menstrual migraine    Panic attack    Sinusitis    Sleep apnea    Sore of lip 11/24/2021   Syncope and collapse    Vertigo of central origin, unspecified ear    Past Surgical History:  Procedure Laterality Date   CHOLECYSTECTOMY N/A 11/26/2019   Procedure: LAPAROSCOPIC CHOLECYSTECTOMY;  Surgeon: Rodman Pickle, MD;  Location: [facility];  Service: General;  Laterality: N/A;   Patient Active Problem List   Diagnosis Date Noted   Chronic gastritis 10/31/2022   History of self mutilation 09/19/2021   Genetic testing 08/25/2021   Nausea without vomiting 07/07/2021   Generalized abdominal pain 07/07/2021   Chronic myofascial pain 04/15/2021   Breast mass, right 10/08/2020   Depression, recurrent (HCC) 08/20/2020   Insomnia 04/07/2020   Hepatic lesion 04/07/2020   S/P laparoscopic cholecystectomy 12/08/2019   Migraine 12/08/2019   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   Chronic right-sided low back pain 09/18/2019   Allergy to environmental factors 10/27/2016   Asthma 10/27/2016    PCP: Alysia Penna  REFERRING PROVIDER: Alysia Penna   REFERRING DIAG:  [phone] (ICD-10-CM) - Chronic right-sided low back pain with right-sided sciatica    Rationale for Evaluation and Treatment:  Rehabilitation  THERAPY DIAG:  Other low back pain  Radiculopathy, lumbar region  Bilateral low back pain with right-sided sciatica, unspecified chronicity  Muscle weakness (generalized)  ONSET DATE: 10/31/22   SUBJECTIVE:                                                                                                                                                                                           SUBJECTIVE STATEMENT: Constant pain in the back that goes down her R side   PERTINENT HISTORY:  Cholecystectomy 2021  PAIN:  Are you having pain?  Yes: NPRS scale: 10/10 Pain location: back and down into R  Pain description: pinching pain, sharp, shooting  Aggravating factors: sitting too long, walking too far  Relieving factors: exercises (3x a week, weights, walking, stretching)   PRECAUTIONS: None  WEIGHT BEARING RESTRICTIONS: No  FALLS:  Has patient fallen in last 6 months? 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   PLOF: Independent  PATIENT GOALS: to get the pain away    OBJECTIVE:   PATIENT SURVEYS:  FOTO 48  SCREENING FOR RED FLAGS: Bowel or bladder incontinence: No Spinal tumors: No Cauda equina syndrome: No Compression fracture: No Abdominal aneurysm: No  COGNITION: Overall cognitive status: Within functional limits for tasks assessed     SENSATION: WFL  MUSCLE LENGTH: Hamstrings: some mild tightness and pain in RLE   PALPATION: TTP L4-L5 an R SIJ   LUMBAR ROM:   AROM eval  Flexion WNL  Extension WNL with pain  Right lateral flexion Fib head with pain  Left lateral flexion Fib head with pain  Right rotation 75% with pain  Left rotation 75% with pain   (Blank rows = not tested)  LOWER EXTREMITY ROM:  grossly WFL   LOWER EXTREMITY MMT:  grossly 5/5 BLE   LUMBAR SPECIAL TESTS:  Straight leg raise test: Positive and FABER test: Positive   TODAY'S  TREATMENT:                                                                                                                              DATE:  11/14/22 NuStep L 3 x 6 min S2S 2x10  Shoulder Ext green 2x12 Seated Rows green 2x12 Bridges x10 LE on Pball bridges, Oblq, K2C PROM to LE with end range holds Light manual traction 3x15''  EVAL- 11/09/22    PATIENT EDUCATION:  Education details: POC and HEP Person educated: Patient Education method: Explanation Education comprehension: verbalized understanding  HOME EXERCISE PROGRAM: Access Code: [email or web address] URL: [email or web address] Date: 11/09/2022 Prepared by: Cassie Freer  Exercises - Supine Lower Trunk Rotation  - 1 x daily - 7 x weekly - 2 sets - 10 reps - Supine Bridge  - 1 x daily - 7 x weekly - 2 sets - 10 reps - Clamshell with Resistance  - 1 x daily - 7 x weekly - 2 sets - 10 reps - Supine Piriformis Stretch  - 1 x daily - 7 x weekly - 2 sets - 30 hold  ASSESSMENT:  CLINICAL IMPRESSION: Patient is a 22 y.o. female who was seen today for physical therapy treatment for LBP. She reports her pain has been ongoing for about 4 years now. The pain runs across her low back and into her R leg. Pt reports pain in constant and does not get better. Despite subjective reports she was able to complete all exercise interventions. Postural cues needed with shoulder extensions. Pess pain reported during lower  trunk rotations and manual traction.   She will benefit from skilled PT to address her high pain levels to improve QOL.   OBJECTIVE IMPAIRMENTS: difficulty walking and pain.   ACTIVITY LIMITATIONS: stairs and locomotion level  REHAB POTENTIAL: Good  CLINICAL DECISION MAKING: Stable/uncomplicated  EVALUATION COMPLEXITY: Low  GOALS: Goals reviewed with patient? Yes  SHORT TERM GOALS: Target date: 12/14/22  Patient will be independent with initial HEP.  Goal status: INITIAL  2.  Patient will report centralization of  radicular symptoms.  Baseline: radiates into RLE Goal status: INITIAL   LONG TERM GOALS: Target date: 01/18/23  Patient will be independent with advanced/ongoing HEP to improve outcomes and carryover.  Goal status: INITIAL  2.  Patient will report 75% improvement in low back pain to improve QOL.  Baseline: 9.5/10 pain Goal status: INITIAL  3.  Patient will demonstrate full pain free lumbar ROM to perform ADLs.   Baseline: pain with movements, see chart Goal status: INITIAL  4.  Patient will tolerate 1 hour of (standing/sitting/walking) to perform ADLs without pain. Baseline: pain with prolonged sitting or walking  Goal status: INITIAL   PLAN:  PT FREQUENCY: 1-2x/week  PT DURATION: 10 weeks  PLANNED INTERVENTIONS: Therapeutic exercises, Therapeutic activity, Neuromuscular re-education, Balance training, Gait training, Patient/Family education, Self Care, Joint mobilization, Joint manipulation, Dry Needling, Electrical stimulation, Spinal manipulation, Spinal mobilization, Cryotherapy, Moist heat, Traction, Ionotophoresis 4mg /ml Dexamethasone, and Manual therapy.  PLAN FOR NEXT SESSION: start gym activities working on LB and R hip stretching and strengthening, can try traction if she continues to have radiating sx into RLE    Grayce Sessions, PTA 11/14/2022, 4:01 PM

## 2022-11-17 ENCOUNTER — Other Ambulatory Visit: Payer: Self-pay

## 2022-11-17 DIAGNOSIS — K2950 Unspecified chronic gastritis without bleeding: Secondary | ICD-10-CM

## 2022-11-17 MED ORDER — OMEPRAZOLE 20 MG PO CPDR
20.0000 mg | DELAYED_RELEASE_CAPSULE | Freq: Two times a day (BID) | ORAL | 0 refills | Status: AC
Start: 2022-11-17 — End: ?

## 2022-11-21 ENCOUNTER — Ambulatory Visit: Payer: 59

## 2022-11-23 ENCOUNTER — Ambulatory Visit: Payer: 59 | Admitting: Physical Therapy

## 2022-11-23 ENCOUNTER — Encounter: Payer: Self-pay | Admitting: Physical Therapy

## 2022-11-23 DIAGNOSIS — M5416 Radiculopathy, lumbar region: Secondary | ICD-10-CM

## 2022-11-23 DIAGNOSIS — M5459 Other low back pain: Secondary | ICD-10-CM | POA: Diagnosis not present

## 2022-11-23 DIAGNOSIS — M6281 Muscle weakness (generalized): Secondary | ICD-10-CM

## 2022-11-23 DIAGNOSIS — M5441 Lumbago with sciatica, right side: Secondary | ICD-10-CM

## 2022-11-23 NOTE — Therapy (Signed)
OUTPATIENT PHYSICAL THERAPY THORACOLUMBAR EVALUATION   Patient Name: Rebecca Rocha MRN: [id number] DOB:2001-01-08, 22 y.o., female Today's Date: 11/23/2022  END OF SESSION:  PT End of Session - 11/23/22 1307     Visit Number 3    Date for PT Re-Evaluation 01/18/23    PT Start Time 1306    PT Stop Time 1345    PT Time Calculation (min) 39 min    Activity Tolerance Patient tolerated treatment well    Behavior During Therapy [facility] for tasks assessed/performed             Past Medical History:  Diagnosis Date   Acute calculous cholecystitis 11/26/2019   Allergy    Anxiety    Asthma    Chronic headache disorder    Chronic upper back pain    Depression    Menstrual migraine    Panic attack    Sinusitis    Sleep apnea    Sore of lip 11/24/2021   Syncope and collapse    Vertigo of central origin, unspecified ear    Past Surgical History:  Procedure Laterality Date   CHOLECYSTECTOMY N/A 11/26/2019   Procedure: LAPAROSCOPIC CHOLECYSTECTOMY;  Surgeon: Rodman Pickle, MD;  Location: [facility];  Service: General;  Laterality: N/A;   Patient Active Problem List   Diagnosis Date Noted   Chronic gastritis 10/31/2022   History of self mutilation 09/19/2021   Genetic testing 08/25/2021   Nausea without vomiting 07/07/2021   Generalized abdominal pain 07/07/2021   Chronic myofascial pain 04/15/2021   Breast mass, right 10/08/2020   Depression, recurrent (HCC) 08/20/2020   Insomnia 04/07/2020   Hepatic lesion 04/07/2020   S/P laparoscopic cholecystectomy 12/08/2019   Migraine 12/08/2019   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   Chronic right-sided low back pain 09/18/2019   Allergy to environmental factors 10/27/2016   Asthma 10/27/2016    PCP: Alysia Penna  REFERRING PROVIDER: Alysia Penna   REFERRING DIAG:  [phone] (ICD-10-CM) - Chronic right-sided low back pain with right-sided sciatica    Rationale for Evaluation and Treatment:  Rehabilitation  THERAPY DIAG:  Radiculopathy, lumbar region  Bilateral low back pain with right-sided sciatica, unspecified chronicity  Muscle weakness (generalized)  Other low back pain  ONSET DATE: 10/31/22   SUBJECTIVE:                                                                                                                                                                                           SUBJECTIVE STATEMENT: "Pain all over"  PERTINENT HISTORY:  Cholecystectomy 2021  PAIN:  Are you having pain? Yes: NPRS scale: 9/10 Pain location: back and down  into R  Pain description: pinching pain, sharp, shooting  Aggravating factors: sitting too long, walking too far  Relieving factors: exercises (3x a week, weights, walking, stretching)   PRECAUTIONS: None  WEIGHT BEARING RESTRICTIONS: No  FALLS:  Has patient fallen in last 6 months? 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   PLOF: Independent  PATIENT GOALS: to get the pain away    OBJECTIVE:   PATIENT SURVEYS:  FOTO 48  SCREENING FOR RED FLAGS: Bowel or bladder incontinence: No Spinal tumors: No Cauda equina syndrome: No Compression fracture: No Abdominal aneurysm: No  COGNITION: Overall cognitive status: Within functional limits for tasks assessed     SENSATION: WFL  MUSCLE LENGTH: Hamstrings: some mild tightness and pain in RLE   PALPATION: TTP L4-L5 an R SIJ   LUMBAR ROM:   AROM eval  Flexion WNL  Extension WNL with pain  Right lateral flexion Fib head with pain  Left lateral flexion Fib head with pain  Right rotation 75% with pain  Left rotation 75% with pain   (Blank rows = not tested)  LOWER EXTREMITY ROM:  grossly WFL   LOWER EXTREMITY MMT:  grossly 5/5 BLE   LUMBAR SPECIAL TESTS:  Straight leg raise test: Positive and FABER test: Positive   TODAY'S TREATMENT:                                                                                                                               DATE:  11/23/22 NuStep L4 x 6  HS 20lb 2x10  Leg Ext 5lb 2x10 Rows & Lats 20lb 2x10  PROM to LE with end range holds    11/21/22 NuStep Calf stretch Stretching LE and low back STS with OHP  Traction    11/14/22 NuStep L 3 x 6 min S2S 2x10  Shoulder Ext green 2x12 Seated Rows green 2x12 Bridges x10 LE on Pball bridges, Oblq, K2C PROM to LE with end range holds Light manual traction 3x15''  EVAL- 11/09/22    PATIENT EDUCATION:  Education details: POC and HEP Person educated: Patient Education method: Explanation Education comprehension: verbalized understanding  HOME EXERCISE PROGRAM: Access Code: [email or web address] URL: [email or web address] Date: 11/09/2022 Prepared by: Cassie Freer  Exercises - Supine Lower Trunk Rotation  - 1 x daily - 7 x weekly - 2 sets - 10 reps - Supine Bridge  - 1 x daily - 7 x weekly - 2 sets - 10 reps - Clamshell with Resistance  - 1 x daily - 7 x weekly - 2 sets - 10 reps - Supine Piriformis Stretch  - 1 x daily - 7 x weekly - 2 sets - 30 hold  ASSESSMENT:  CLINICAL IMPRESSION: Patient is a 22 y.o. female who was seen today for physical therapy treatment for LBP. She reports her pain has been ongoing for about 4 years now. The pain runs across her low  back and into her R leg. Pt reports pain in constant and does not get better. She enters ~ 6 minutes late. Despite subjective reports she was able to complete all exercise interventions. Postural cues needed with shoulder extensions. Kept to machine level isolation strengthening to see if it caused less pain. Bilateral tightness with K2C. She will benefit from skilled PT to address her high pain levels to improve QOL.   OBJECTIVE IMPAIRMENTS: difficulty walking and pain.   ACTIVITY LIMITATIONS: stairs and locomotion level  REHAB POTENTIAL: Good  CLINICAL DECISION MAKING:  Stable/uncomplicated  EVALUATION COMPLEXITY: Low  GOALS: Goals reviewed with patient? Yes  SHORT TERM GOALS: Target date: 12/14/22  Patient will be independent with initial HEP.  Goal status: INITIAL  2.  Patient will report centralization of radicular symptoms.  Baseline: radiates into RLE Goal status: INITIAL   LONG TERM GOALS: Target date: 01/18/23  Patient will be independent with advanced/ongoing HEP to improve outcomes and carryover.  Goal status: INITIAL  2.  Patient will report 75% improvement in low back pain to improve QOL.  Baseline: 9.5/10 pain Goal status: INITIAL  3.  Patient will demonstrate full pain free lumbar ROM to perform ADLs.   Baseline: pain with movements, see chart Goal status: INITIAL  4.  Patient will tolerate 1 hour of (standing/sitting/walking) to perform ADLs without pain. Baseline: pain with prolonged sitting or walking  Goal status: INITIAL   PLAN:  PT FREQUENCY: 1-2x/week  PT DURATION: 10 weeks  PLANNED INTERVENTIONS: Therapeutic exercises, Therapeutic activity, Neuromuscular re-education, Balance training, Gait training, Patient/Family education, Self Care, Joint mobilization, Joint manipulation, Dry Needling, Electrical stimulation, Spinal manipulation, Spinal mobilization, Cryotherapy, Moist heat, Traction, Ionotophoresis 4mg /ml Dexamethasone, and Manual therapy.  PLAN FOR NEXT SESSION: start gym activities working on LB and R hip stretching and strengthening, can try traction if she continues to have radiating sx into RLE    Grayce Sessions, PTA 11/23/2022, 1:09 PM

## 2022-11-27 ENCOUNTER — Ambulatory Visit: Payer: 59 | Admitting: Physical Therapy

## 2022-11-27 DIAGNOSIS — M5441 Lumbago with sciatica, right side: Secondary | ICD-10-CM

## 2022-11-27 DIAGNOSIS — M5416 Radiculopathy, lumbar region: Secondary | ICD-10-CM

## 2022-11-27 DIAGNOSIS — M5459 Other low back pain: Secondary | ICD-10-CM

## 2022-11-27 DIAGNOSIS — M6281 Muscle weakness (generalized): Secondary | ICD-10-CM

## 2022-11-27 NOTE — Therapy (Signed)
OUTPATIENT PHYSICAL THERAPY THORACOLUMBAR EVALUATION   Patient Name: Lou Irigoyen MRN: [id number] DOB:16-May-2000, 22 y.o., female Today's Date: 11/27/2022  END OF SESSION:  PT End of Session - 11/27/22 0805     Visit Number 4    Date for PT Re-Evaluation 01/18/23    PT Start Time 0805    PT Stop Time 0845    PT Time Calculation (min) 40 min    Activity Tolerance Patient tolerated treatment well    Behavior During Therapy [facility] for tasks assessed/performed             Past Medical History:  Diagnosis Date   Acute calculous cholecystitis 11/26/2019   Allergy    Anxiety    Asthma    Chronic headache disorder    Chronic upper back pain    Depression    Menstrual migraine    Panic attack    Sinusitis    Sleep apnea    Sore of lip 11/24/2021   Syncope and collapse    Vertigo of central origin, unspecified ear    Past Surgical History:  Procedure Laterality Date   CHOLECYSTECTOMY N/A 11/26/2019   Procedure: LAPAROSCOPIC CHOLECYSTECTOMY;  Surgeon: Rodman Pickle, MD;  Location: [facility];  Service: General;  Laterality: N/A;   Patient Active Problem List   Diagnosis Date Noted   Chronic gastritis 10/31/2022   History of self mutilation 09/19/2021   Genetic testing 08/25/2021   Nausea without vomiting 07/07/2021   Generalized abdominal pain 07/07/2021   Chronic myofascial pain 04/15/2021   Breast mass, right 10/08/2020   Depression, recurrent (HCC) 08/20/2020   Insomnia 04/07/2020   Hepatic lesion 04/07/2020   S/P laparoscopic cholecystectomy 12/08/2019   Migraine 12/08/2019   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   Chronic right-sided low back pain 09/18/2019   Allergy to environmental factors 10/27/2016   Asthma 10/27/2016    PCP: Alysia Penna  REFERRING PROVIDER: Alysia Penna   REFERRING DIAG:  [phone] (ICD-10-CM) - Chronic right-sided low back pain with right-sided sciatica    Rationale for Evaluation and Treatment:  Rehabilitation  THERAPY DIAG:  Radiculopathy, lumbar region  Bilateral low back pain with right-sided sciatica, unspecified chronicity  Muscle weakness (generalized)  Other low back pain  ONSET DATE: 10/31/22   SUBJECTIVE:                                                                                                                                                                                           SUBJECTIVE STATEMENT: Doing okay - I'm here  PERTINENT HISTORY:  Cholecystectomy 2021  PAIN:  Are you having pain? Yes: NPRS scale: 7-9/10 Pain location: back  and down into R  Pain description: pinching pain, sharp, shooting  Aggravating factors: sitting too long, walking too far  Relieving factors: exercises (3x a week, weights, walking, stretching)   PRECAUTIONS: None  WEIGHT BEARING RESTRICTIONS: No  FALLS:  Has patient fallen in last 6 months? 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   PLOF: Independent  PATIENT GOALS: to get the pain away    OBJECTIVE:   PATIENT SURVEYS:  FOTO 48  SCREENING FOR RED FLAGS: Bowel or bladder incontinence: No Spinal tumors: No Cauda equina syndrome: No Compression fracture: No Abdominal aneurysm: No  COGNITION: Overall cognitive status: Within functional limits for tasks assessed     SENSATION: WFL  MUSCLE LENGTH: Hamstrings: some mild tightness and pain in RLE   PALPATION: TTP L4-L5 an R SIJ   LUMBAR ROM:   AROM eval  Flexion WNL  Extension WNL with pain  Right lateral flexion Fib head with pain  Left lateral flexion Fib head with pain  Right rotation 75% with pain  Left rotation 75% with pain   (Blank rows = not tested)  LOWER EXTREMITY ROM:  grossly WFL   LOWER EXTREMITY MMT:  grossly 5/5 BLE   LUMBAR SPECIAL TESTS:  Straight leg raise test: Positive and FABER test: Positive   TODAY'S TREATMENT:                                                                                                                               DATE:   11/27/22 Bike L2 x Lats and rows 20lb 2x10 Leg press 40lb 2x10 Cable trunk rotation 20lb B 2x10 Standing hip ext R 10lb 2x10 Bridges/K2C/obliques with orange ball 2x10 PROM/stretching to R LE    11/23/22 NuStep L4 x 6  HS 20lb 2x10  Leg Ext 5lb 2x10 Rows & Lats 20lb 2x10  PROM to LE with end range holds    11/21/22 NuStep Calf stretch Stretching LE and low back STS with OHP  Traction    11/14/22 NuStep L 3 x 6 min S2S 2x10  Shoulder Ext green 2x12 Seated Rows green 2x12 Bridges x10 LE on Pball bridges, Oblq, K2C PROM to LE with end range holds Light manual traction 3x15''  EVAL- 11/09/22    PATIENT EDUCATION:  Education details: POC and HEP Person educated: Patient Education method: Explanation Education comprehension: verbalized understanding  HOME EXERCISE PROGRAM: Access Code: [email or web address] URL: [email or web address] Date: 11/09/2022 Prepared by: Cassie Freer  Exercises - Supine Lower Trunk Rotation  - 1 x daily - 7 x weekly - 2 sets - 10 reps - Supine Bridge  - 1 x daily - 7 x weekly - 2 sets - 10 reps - Clamshell with Resistance  - 1 x daily - 7 x weekly - 2 sets - 10 reps - Supine Piriformis Stretch  - 1 x daily - 7 x weekly - 2 sets -  30 hold  ASSESSMENT:  CLINICAL IMPRESSION: Patient is a 22 y.o. female who was seen today for physical therapy treatment for LBP. She reports her pain has been ongoing for about 4 years now. The pain runs across her low back and into her R leg. Pt reported her pain being high prior to the session but tolerated treatment well, despite high pain level. Treatment focused on LE and core strengthening. Pt initially felt some discomfort in her R leg during leg press and hip ext that went away as reps increased. She reported a slight decrease in pain after the session. Said she felt a random shooting pain  in her core 2 days ago that went away, told her to monitor it if it happens again.  OBJECTIVE IMPAIRMENTS: difficulty walking and pain.   ACTIVITY LIMITATIONS: stairs and locomotion level  REHAB POTENTIAL: Good  CLINICAL DECISION MAKING: Stable/uncomplicated  EVALUATION COMPLEXITY: Low  GOALS: Goals reviewed with patient? Yes  SHORT TERM GOALS: Target date: 12/14/22  Patient will be independent with initial HEP.  Goal status: INITIAL  2.  Patient will report centralization of radicular symptoms.  Baseline: radiates into RLE Goal status: INITIAL   LONG TERM GOALS: Target date: 01/18/23  Patient will be independent with advanced/ongoing HEP to improve outcomes and carryover.  Goal status: INITIAL  2.  Patient will report 75% improvement in low back pain to improve QOL.  Baseline: 9.5/10 pain Goal status: INITIAL  3.  Patient will demonstrate full pain free lumbar ROM to perform ADLs.   Baseline: pain with movements, see chart Goal status: INITIAL  4.  Patient will tolerate 1 hour of (standing/sitting/walking) to perform ADLs without pain. Baseline: pain with prolonged sitting or walking  Goal status: INITIAL   PLAN:  PT FREQUENCY: 1-2x/week  PT DURATION: 10 weeks  PLANNED INTERVENTIONS: Therapeutic exercises, Therapeutic activity, Neuromuscular re-education, Balance training, Gait training, Patient/Family education, Self Care, Joint mobilization, Joint manipulation, Dry Needling, Electrical stimulation, Spinal manipulation, Spinal mobilization, Cryotherapy, Moist heat, Traction, Ionotophoresis 4mg /ml Dexamethasone, and Manual therapy.  PLAN FOR NEXT SESSION: start gym activities working on LB and R hip stretching and strengthening, can try traction if she continues to have radiating sx into RLE    George Ina, SPTA 11/27/2022, 8:06 AM

## 2022-11-29 NOTE — Therapy (Signed)
OUTPATIENT PHYSICAL THERAPY THORACOLUMBAR TREATMENT   Patient Name: Rebecca Rocha MRN: [id number] DOB:2001-03-18, 22 y.o., female Today's Date: 11/30/2022  END OF SESSION:  PT End of Session - 11/30/22 1803     Visit Number 5    Date for PT Re-Evaluation 01/18/23    PT Start Time 1803    PT Stop Time 1845    PT Time Calculation (min) 42 min    Activity Tolerance Patient tolerated treatment well    Behavior During Therapy [facility] for tasks assessed/performed              Past Medical History:  Diagnosis Date   Acute calculous cholecystitis 11/26/2019   Allergy    Anxiety    Asthma    Chronic headache disorder    Chronic upper back pain    Depression    Menstrual migraine    Panic attack    Sinusitis    Sleep apnea    Sore of lip 11/24/2021   Syncope and collapse    Vertigo of central origin, unspecified ear    Past Surgical History:  Procedure Laterality Date   CHOLECYSTECTOMY N/A 11/26/2019   Procedure: LAPAROSCOPIC CHOLECYSTECTOMY;  Surgeon: Rodman Pickle, MD;  Location: [facility];  Service: General;  Laterality: N/A;   Patient Active Problem List   Diagnosis Date Noted   Chronic gastritis 10/31/2022   History of self mutilation 09/19/2021   Genetic testing 08/25/2021   Nausea without vomiting 07/07/2021   Generalized abdominal pain 07/07/2021   Chronic myofascial pain 04/15/2021   Breast mass, right 10/08/2020   Depression, recurrent (HCC) 08/20/2020   Insomnia 04/07/2020   Hepatic lesion 04/07/2020   S/P laparoscopic cholecystectomy 12/08/2019   Migraine 12/08/2019   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   Chronic right-sided low back pain 09/18/2019   Allergy to environmental factors 10/27/2016   Asthma 10/27/2016    PCP: Alysia Penna  REFERRING PROVIDER: Alysia Penna   REFERRING DIAG:  [phone] (ICD-10-CM) - Chronic right-sided low back pain with right-sided sciatica    Rationale for Evaluation and Treatment:  Rehabilitation  THERAPY DIAG:  Radiculopathy, lumbar region  Bilateral low back pain with right-sided sciatica, unspecified chronicity  Muscle weakness (generalized)  Other low back pain  ONSET DATE: 10/31/22   SUBJECTIVE:                                                                                                                                                                                           SUBJECTIVE STATEMENT: My right side is still killing me, took some pain meds and it helping a little bit but it is still there.  PERTINENT HISTORY:  Cholecystectomy 2021  PAIN:  Are you having pain? Yes: NPRS scale: 8.5/10 Pain location: back and down into R  Pain description: pinching pain, sharp, shooting  Aggravating factors: sitting too long, walking too far  Relieving factors: exercises (3x a week, weights, walking, stretching)   PRECAUTIONS: None  WEIGHT BEARING RESTRICTIONS: No  FALLS:  Has patient fallen in last 6 months? 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   PLOF: Independent  PATIENT GOALS: to get the pain away    OBJECTIVE:   PATIENT SURVEYS:  FOTO 48  SCREENING FOR RED FLAGS: Bowel or bladder incontinence: No Spinal tumors: No Cauda equina syndrome: No Compression fracture: No Abdominal aneurysm: No  COGNITION: Overall cognitive status: Within functional limits for tasks assessed     SENSATION: WFL  MUSCLE LENGTH: Hamstrings: some mild tightness and pain in RLE   PALPATION: TTP L4-L5 an R SIJ   LUMBAR ROM:   AROM eval  Flexion WNL  Extension WNL with pain  Right lateral flexion Fib head with pain  Left lateral flexion Fib head with pain  Right rotation 75% with pain  Left rotation 75% with pain   (Blank rows = not tested)  LOWER EXTREMITY ROM:  grossly WFL   LOWER EXTREMITY MMT:  grossly 5/5 BLE   LUMBAR SPECIAL TESTS:  Straight leg raise  test: Positive and FABER test: Positive   TODAY'S TREATMENT:                                                                                                                              DATE:  11/30/22 NuStep L5 x56mins   Calf stretch 30s  Stretching HS, piriformis, DKTC, SKTC, glutes, trunk rotations  STS with OHP 2x10 Leg ext 10# 2x10 HS curls 20# 2x10 Traction to lumbar spine 80# 10 mins   11/27/22 Bike L2 x Lats and rows 20lb 2x10 Leg press 40lb 2x10 Cable trunk rotation 20lb B 2x10 Standing hip ext R 10lb 2x10 Bridges/K2C/obliques with orange ball 2x10 PROM/stretching to R LE   11/23/22 NuStep L4 x 6  HS 20lb 2x10  Leg Ext 5lb 2x10 Rows & Lats 20lb 2x10  PROM to LE with end range holds   11/14/22 NuStep L 3 x 6 min S2S 2x10  Shoulder Ext green 2x12 Seated Rows green 2x12 Bridges x10 LE on Pball bridges, Oblq, K2C PROM to LE with end range holds Light manual traction 3x15''  EVAL- 11/09/22    PATIENT EDUCATION:  Education details: POC and HEP Person educated: Patient Education method: Explanation Education comprehension: verbalized understanding  HOME EXERCISE PROGRAM: Access Code: [email or web address] URL: [email or web address] Date: 11/09/2022 Prepared by: Cassie Freer  Exercises - Supine Lower Trunk Rotation  - 1 x daily - 7 x weekly - 2 sets - 10 reps - Supine Bridge  - 1 x daily - 7 x weekly - 2 sets -  10 reps - Clamshell with Resistance  - 1 x daily - 7 x weekly - 2 sets - 10 reps - Supine Piriformis Stretch  - 1 x daily - 7 x weekly - 2 sets - 30 hold  ASSESSMENT:  CLINICAL IMPRESSION: Patient is a 22 y.o. female who was seen today for physical therapy treatment for LBP. She continues to present with high pain levels. Treatment focused on LE strengthening and stretching. She is able to get through exercises without complaints of pain. We tried traction today as she continues to have some radicular symptoms in to her leg. Will access to see  if there was any relief with it next visit.   OBJECTIVE IMPAIRMENTS: difficulty walking and pain.   ACTIVITY LIMITATIONS: stairs and locomotion level  REHAB POTENTIAL: Good  CLINICAL DECISION MAKING: Stable/uncomplicated  EVALUATION COMPLEXITY: Low  GOALS: Goals reviewed with patient? Yes  SHORT TERM GOALS: Target date: 12/14/22  Patient will be independent with initial HEP.  Goal status: INITIAL  2.  Patient will report centralization of radicular symptoms.  Baseline: radiates into RLE Goal status: INITIAL   LONG TERM GOALS: Target date: 01/18/23  Patient will be independent with advanced/ongoing HEP to improve outcomes and carryover.  Goal status: INITIAL  2.  Patient will report 75% improvement in low back pain to improve QOL.  Baseline: 9.5/10 pain Goal status: INITIAL  3.  Patient will demonstrate full pain free lumbar ROM to perform ADLs.   Baseline: pain with movements, see chart Goal status: INITIAL  4.  Patient will tolerate 1 hour of (standing/sitting/walking) to perform ADLs without pain. Baseline: pain with prolonged sitting or walking  Goal status: INITIAL   PLAN:  PT FREQUENCY: 1-2x/week  PT DURATION: 10 weeks  PLANNED INTERVENTIONS: Therapeutic exercises, Therapeutic activity, Neuromuscular re-education, Balance training, Gait training, Patient/Family education, Self Care, Joint mobilization, Joint manipulation, Dry Needling, Electrical stimulation, Spinal manipulation, Spinal mobilization, Cryotherapy, Moist heat, Traction, Ionotophoresis 4mg /ml Dexamethasone, and Manual therapy.  PLAN FOR NEXT SESSION: start gym activities working on LB and R hip stretching and strengthening, blackTB, rows and lats     Cassie Freer, PT, DPT 11/30/2022, 6:47 PM

## 2022-11-30 ENCOUNTER — Ambulatory Visit: Payer: 59

## 2022-11-30 DIAGNOSIS — M5459 Other low back pain: Secondary | ICD-10-CM

## 2022-11-30 DIAGNOSIS — M5416 Radiculopathy, lumbar region: Secondary | ICD-10-CM

## 2022-11-30 DIAGNOSIS — M6281 Muscle weakness (generalized): Secondary | ICD-10-CM

## 2022-11-30 DIAGNOSIS — M5441 Lumbago with sciatica, right side: Secondary | ICD-10-CM

## 2022-12-05 ENCOUNTER — Ambulatory Visit: Payer: 59 | Admitting: Physical Therapy

## 2022-12-05 ENCOUNTER — Encounter: Payer: Self-pay | Admitting: Physical Therapy

## 2022-12-05 DIAGNOSIS — M6281 Muscle weakness (generalized): Secondary | ICD-10-CM

## 2022-12-05 DIAGNOSIS — M5459 Other low back pain: Secondary | ICD-10-CM | POA: Diagnosis not present

## 2022-12-05 DIAGNOSIS — M5441 Lumbago with sciatica, right side: Secondary | ICD-10-CM

## 2022-12-05 DIAGNOSIS — M5416 Radiculopathy, lumbar region: Secondary | ICD-10-CM

## 2022-12-05 NOTE — Therapy (Signed)
OUTPATIENT PHYSICAL THERAPY THORACOLUMBAR TREATMENT   Patient Name: Rebecca Rocha MRN: [id number] DOB:Apr 28, 2001, 22 y.o., female Today's Date: 12/05/2022  END OF SESSION:  PT End of Session - 12/05/22 1350     Visit Number 6    Date for PT Re-Evaluation 01/18/23    PT Start Time 1350    PT Stop Time 1430    PT Time Calculation (min) 40 min    Activity Tolerance Patient tolerated treatment well    Behavior During Therapy [facility] for tasks assessed/performed              Past Medical History:  Diagnosis Date   Acute calculous cholecystitis 11/26/2019   Allergy    Anxiety    Asthma    Chronic headache disorder    Chronic upper back pain    Depression    Menstrual migraine    Panic attack    Sinusitis    Sleep apnea    Sore of lip 11/24/2021   Syncope and collapse    Vertigo of central origin, unspecified ear    Past Surgical History:  Procedure Laterality Date   CHOLECYSTECTOMY N/A 11/26/2019   Procedure: LAPAROSCOPIC CHOLECYSTECTOMY;  Surgeon: Rodman Pickle, MD;  Location: [facility];  Service: General;  Laterality: N/A;   Patient Active Problem List   Diagnosis Date Noted   Chronic gastritis 10/31/2022   History of self mutilation 09/19/2021   Genetic testing 08/25/2021   Nausea without vomiting 07/07/2021   Generalized abdominal pain 07/07/2021   Chronic myofascial pain 04/15/2021   Breast mass, right 10/08/2020   Depression, recurrent (HCC) 08/20/2020   Insomnia 04/07/2020   Hepatic lesion 04/07/2020   S/P laparoscopic cholecystectomy 12/08/2019   Migraine 12/08/2019   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   Chronic right-sided low back pain 09/18/2019   Allergy to environmental factors 10/27/2016   Asthma 10/27/2016    PCP: Alysia Penna  REFERRING PROVIDER: Alysia Penna   REFERRING DIAG:  [phone] (ICD-10-CM) - Chronic right-sided low back pain with right-sided sciatica    Rationale for Evaluation and Treatment:  Rehabilitation  THERAPY DIAG:  Radiculopathy, lumbar region  Bilateral low back pain with right-sided sciatica, unspecified chronicity  Muscle weakness (generalized)  Other low back pain  ONSET DATE: 10/31/22   SUBJECTIVE:                                                                                                                                                                                           SUBJECTIVE STATEMENT: "Half and half" A lot of pain oh her spinal cord, constant since the last appointment she had here.  PERTINENT HISTORY:  Cholecystectomy  2021  PAIN:  Are you having pain? Yes: NPRS scale: 8.5/10 Pain location: back and down into R  Pain description: pinching pain, sharp, shooting  Aggravating factors: sitting too long, walking too far  Relieving factors: exercises (3x a week, weights, walking, stretching)   PRECAUTIONS: None  WEIGHT BEARING RESTRICTIONS: No  FALLS:  Has patient fallen in last 6 months? 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  Extension WNL with pain WFL P!  Right lateral flexion Fib head with pain WFL   Left lateral flexion Fib head with pain WFL tiny pinch  Right rotation 75% with pain WFL  moderate P!  Left rotation 75% with pain WFL   (Blank rows = not tested)  LOWER EXTREMITY ROM:  grossly WFL   LOWER EXTREMITY MMT:   grossly 5/5 BLE   LUMBAR SPECIAL TESTS:  Straight leg raise test: Positive and FABER test: Positive   TODAY'S TREATMENT:                                                                                                                              DATE:  12/05/22 NuStep L4 x 6 min Checked GOALS Shoulder Ext 10lb 2x10 AR press 15lb x0 each HS curls 25lb 2x15 Leg Ext 10lb 2x12 Leg Ext 5lb 2x10 Stretching HS, piriformis, DKTC, SKTC, glutes, trunk rotations   11/30/22 NuStep L5 x46mins  Calf stretch 30s  Stretching HS, piriformis, DKTC, SKTC, glutes, trunk rotations  STS with OHP 2x10 Leg ext 10# 2x10 HS curls 20# 2x10 Traction to lumbar spine 80# 10 mins   11/27/22 Bike L2 x Lats and rows 20lb 2x10 Leg press 40lb 2x10 Cable trunk rotation 20lb B 2x10 Standing hip ext R 10lb 2x10 Bridges/K2C/obliques with orange ball 2x10 PROM/stretching to R LE   11/23/22 NuStep L4 x 6  HS 20lb 2x10  Leg Ext 5lb 2x10 Rows & Lats 20lb 2x10  PROM to LE with end range holds   11/14/22 NuStep L 3 x 6 min S2S 2x10  Shoulder Ext green 2x12 Seated Rows green 2x12 Bridges x10 LE on Pball bridges, Oblq, K2C PROM to LE with end range holds Light manual traction 3x15''  EVAL- 11/09/22    PATIENT EDUCATION:  Education details: POC and HEP Person educated: Patient Education method: Explanation Education comprehension: verbalized understanding  HOME EXERCISE  PROGRAM: Access Code: [email or web address] URL: [email or web address] Date: 11/09/2022 Prepared by: Cassie Freer  Exercises - Supine Lower Trunk Rotation  - 1 x daily - 7 x weekly - 2 sets - 10 reps - Supine Bridge  - 1 x daily - 7 x weekly - 2 sets - 10 reps - Clamshell with Resistance  - 1 x daily - 7 x weekly - 2 sets - 10 reps - Supine Piriformis Stretch  - 1 x daily - 7 x weekly - 2 sets - 30 hold  ASSESSMENT:  CLINICAL IMPRESSION: Patient is a 22 y.o. female who was seen today for physical therapy treatment for  LBP. She continues to present with high pain levels. Treatment focused on LE strengthening and stretching. She has progressed towards and meeting some long an short term goals despite high pain rating.She is able to get through exercises without complaints of pain. Core weakness present with AR presses.   OBJECTIVE IMPAIRMENTS: difficulty walking and pain.   ACTIVITY LIMITATIONS: stairs and locomotion level  REHAB POTENTIAL: Good  CLINICAL DECISION MAKING: Stable/uncomplicated  EVALUATION COMPLEXITY: Low  GOALS: Goals reviewed with patient? Yes  SHORT TERM GOALS: Target date: 12/14/22  Patient will be independent with initial HEP.  Goal status: Met 12/05/22  2.  Patient will report centralization of radicular symptoms.  Baseline: radiates into RLE Goal status: improved 50%    LONG TERM GOALS: Target date: 01/18/23  Patient will be independent with advanced/ongoing HEP to improve outcomes and carryover.  Goal status: On going  2.  Patient will report 75% improvement in low back pain to improve QOL.  Baseline: 9.5/10 pain Goal status: INITIAL  3.  Patient will demonstrate full pain free lumbar ROM to perform ADLs.   Baseline: pain with movements, see chart Goal status: Has met PROM porting of goal, still has some pain  4.  Patient will tolerate 1 hour of (standing/sitting/walking) to perform ADLs without pain. Baseline: pain with prolonged sitting or walking  Goal status: On going   PLAN:  PT FREQUENCY: 1-2x/week  PT DURATION: 10 weeks  PLANNED INTERVENTIONS: Therapeutic exercises, Therapeutic activity, Neuromuscular re-education, Balance training, Gait training, Patient/Family education, Self Care, Joint mobilization, Joint manipulation, Dry Needling, Electrical stimulation, Spinal manipulation, Spinal mobilization, Cryotherapy, Moist heat, Traction, Ionotophoresis 4mg /ml Dexamethasone, and Manual therapy.  PLAN FOR NEXT SESSION: start gym activities working on LB and R  hip stretching and strengthening, blackTB, rows and lats     Grayce Sessions, PTA, DPT 12/05/2022, 1:51 PM

## 2022-12-07 ENCOUNTER — Ambulatory Visit: Payer: 59 | Admitting: Physical Therapy

## 2022-12-19 ENCOUNTER — Encounter: Payer: Self-pay | Admitting: Physical Therapy

## 2022-12-19 ENCOUNTER — Ambulatory Visit: Payer: 59 | Attending: Nurse Practitioner | Admitting: Physical Therapy

## 2022-12-19 DIAGNOSIS — M5459 Other low back pain: Secondary | ICD-10-CM | POA: Diagnosis present

## 2022-12-19 DIAGNOSIS — M6281 Muscle weakness (generalized): Secondary | ICD-10-CM | POA: Insufficient documentation

## 2022-12-19 DIAGNOSIS — M5416 Radiculopathy, lumbar region: Secondary | ICD-10-CM | POA: Diagnosis present

## 2022-12-19 DIAGNOSIS — M5441 Lumbago with sciatica, right side: Secondary | ICD-10-CM | POA: Diagnosis present

## 2022-12-19 NOTE — Therapy (Signed)
OUTPATIENT PHYSICAL THERAPY THORACOLUMBAR TREATMENT   Patient Name: Rebecca Rocha MRN: [id number] DOB:May 31, 2000, 22 y.o., female Today's Date: 12/19/2022  END OF SESSION:  PT End of Session - 12/19/22 1346     Visit Number 7    Date for PT Re-Evaluation 01/18/23    PT Start Time 1345    PT Stop Time 1430    PT Time Calculation (min) 45 min    Activity Tolerance Patient tolerated treatment well    Behavior During Therapy [facility] for tasks assessed/performed              Past Medical History:  Diagnosis Date   Acute calculous cholecystitis 11/26/2019   Allergy    Anxiety    Asthma    Chronic headache disorder    Chronic upper back pain    Depression    Menstrual migraine    Panic attack    Sinusitis    Sleep apnea    Sore of lip 11/24/2021   Syncope and collapse    Vertigo of central origin, unspecified ear    Past Surgical History:  Procedure Laterality Date   CHOLECYSTECTOMY N/A 11/26/2019   Procedure: LAPAROSCOPIC CHOLECYSTECTOMY;  Surgeon: Rodman Pickle, MD;  Location: [facility];  Service: General;  Laterality: N/A;   Patient Active Problem List   Diagnosis Date Noted   Chronic gastritis 10/31/2022   History of self mutilation 09/19/2021   Genetic testing 08/25/2021   Nausea without vomiting 07/07/2021   Generalized abdominal pain 07/07/2021   Chronic myofascial pain 04/15/2021   Breast mass, right 10/08/2020   Depression, recurrent (HCC) 08/20/2020   Insomnia 04/07/2020   Hepatic lesion 04/07/2020   S/P laparoscopic cholecystectomy 12/08/2019   Migraine 12/08/2019   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   Chronic right-sided low back pain 09/18/2019   Allergy to environmental factors 10/27/2016   Asthma 10/27/2016    PCP: Alysia Penna  REFERRING PROVIDER: Alysia Penna   REFERRING DIAG:  [phone] (ICD-10-CM) - Chronic right-sided low back pain with right-sided sciatica    Rationale for Evaluation and Treatment:  Rehabilitation  THERAPY DIAG:  Radiculopathy, lumbar region  Muscle weakness (generalized)  Bilateral low back pain with right-sided sciatica, unspecified chronicity  Other low back pain  ONSET DATE: 10/31/22   SUBJECTIVE:                                                                                                                                                                                           SUBJECTIVE STATEMENT: "Still pain but trying to manage it"  PERTINENT HISTORY:  Cholecystectomy 2021  PAIN:  Are you having pain? Yes: NPRS scale: 10/10  Pain location: back and down into R  Pain description: pinching pain, sharp, shooting  Aggravating factors: sitting too long, walking too far  Relieving factors: exercises (3x a week, weights, walking, stretching)   PRECAUTIONS: None  WEIGHT BEARING RESTRICTIONS: No  FALLS:  Has patient fallen in last 6 months? 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  Extension WNL with pain WFL P!  Right lateral flexion Fib head with pain WFL   Left lateral flexion Fib head with pain WFL tiny pinch  Right rotation 75% with pain WFL  moderate P!  Left rotation 75% with pain WFL   (Blank rows = not tested)  LOWER EXTREMITY ROM:  grossly WFL   LOWER EXTREMITY MMT:  grossly 5/5 BLE   LUMBAR SPECIAL TESTS:  Straight leg raise test:  Positive and FABER test: Positive   TODAY'S TREATMENT:                                                                                                                              DATE:  12/19/22 NuStep L4 x 6 min Bike L 2 x 6 min Rows & Lats 15lb 2x10 Leg press 20lb 2x12 HS curls 20lb 2x10 Leg Ext 5lb 2x10 Shoulder Ext 10lb 2x10 Stretching HS, piriformis, DKTC, SKTC, glutes, trunk rotations   12/05/22 NuStep L4 x 6 min Checked GOALS Shoulder Ext 10lb 2x10 AR press 15lb x0 each HS curls 25lb 2x15 Leg Ext 10lb 2x12 Leg Ext 5lb 2x10 Stretching HS, piriformis, DKTC, SKTC, glutes, trunk rotations   11/30/22 NuStep L5 x48mins  Calf stretch 30s  Stretching HS, piriformis, DKTC, SKTC, glutes, trunk rotations  STS with OHP 2x10 Leg ext 10# 2x10 HS curls 20# 2x10 Traction to lumbar spine 80# 10 mins   11/27/22 Bike L2 x Lats and rows 20lb 2x10 Leg press 40lb 2x10 Cable trunk rotation 20lb B 2x10 Standing hip ext R 10lb 2x10 Bridges/K2C/obliques with orange ball 2x10 PROM/stretching to R LE   11/23/22 NuStep L4 x 6  HS 20lb 2x10  Leg Ext 5lb 2x10 Rows & Lats 20lb 2x10  PROM to LE with end range holds   11/14/22 NuStep L 3 x 6 min S2S 2x10  Shoulder Ext green 2x12 Seated Rows green 2x12 Bridges x10 LE on Pball bridges, Oblq, K2C PROM to LE with end range holds  Light manual traction 3x15''  EVAL- 11/09/22    PATIENT EDUCATION:  Education details: POC and HEP Person educated: Patient Education method: Explanation Education comprehension: verbalized understanding  HOME EXERCISE PROGRAM: Access Code: [email or web address] URL: [email or web address] Date: 11/09/2022 Prepared by: Cassie Freer  Exercises - Supine Lower Trunk Rotation  - 1 x daily - 7 x weekly - 2 sets - 10 reps - Supine Bridge  - 1 x daily - 7 x weekly - 2 sets - 10 reps - Clamshell with Resistance  - 1 x daily - 7 x weekly - 2 sets - 10 reps - Supine Piriformis Stretch  - 1 x daily - 7 x  weekly - 2 sets - 30 hold  ASSESSMENT:  CLINICAL IMPRESSION: Patient is a 22 y.o. female who was seen today for physical therapy treatment for LBP. She continues to present with high pain levels. She stated that she wanted to try some exercises because she was sick last week and unable to do much. Session focused on total body strengthening. Pain remain 10/10 throughout sessio that moved from back to knees depending on the interventions. Good motion with passive stretching. Advised pt to contact MD for further testing.  OBJECTIVE IMPAIRMENTS: difficulty walking and pain.   ACTIVITY LIMITATIONS: stairs and locomotion level  REHAB POTENTIAL: Good  CLINICAL DECISION MAKING: Stable/uncomplicated  EVALUATION COMPLEXITY: Low  GOALS: Goals reviewed with patient? Yes  SHORT TERM GOALS: Target date: 12/14/22  Patient will be independent with initial HEP.  Goal status: Met 12/05/22  2.  Patient will report centralization of radicular symptoms.  Baseline: radiates into RLE Goal status: improved 50%    LONG TERM GOALS: Target date: 01/18/23  Patient will be independent with advanced/ongoing HEP to improve outcomes and carryover.  Goal status: On going  2.  Patient will report 75% improvement in low back pain to improve QOL.  Baseline: 9.5/10 pain Goal status: INITIAL  3.  Patient will demonstrate full pain free lumbar ROM to perform ADLs.   Baseline: pain with movements, see chart Goal status: Has met PROM porting of goal, still has some pain  4.  Patient will tolerate 1 hour of (standing/sitting/walking) to perform ADLs without pain. Baseline: pain with prolonged sitting or walking  Goal status: On going   PLAN:  PT FREQUENCY: 1-2x/week  PT DURATION: 10 weeks  PLANNED INTERVENTIONS: Therapeutic exercises, Therapeutic activity, Neuromuscular re-education, Balance training, Gait training, Patient/Family education, Self Care, Joint mobilization, Joint manipulation, Dry Needling,  Electrical stimulation, Spinal manipulation, Spinal mobilization, Cryotherapy, Moist heat, Traction, Ionotophoresis 4mg /ml Dexamethasone, and Manual therapy.  PLAN FOR NEXT SESSION: start gym activities working on LB and R hip stretching and strengthening, blackTB, rows and lats     Grayce Sessions, PTA, DPT 12/19/2022, 1:46 PM

## 2022-12-21 ENCOUNTER — Ambulatory Visit: Payer: 59 | Admitting: Physical Therapy

## 2022-12-21 ENCOUNTER — Encounter: Payer: Self-pay | Admitting: Physical Therapy

## 2022-12-21 DIAGNOSIS — M5416 Radiculopathy, lumbar region: Secondary | ICD-10-CM | POA: Diagnosis not present

## 2022-12-21 DIAGNOSIS — M6281 Muscle weakness (generalized): Secondary | ICD-10-CM

## 2022-12-21 DIAGNOSIS — M5459 Other low back pain: Secondary | ICD-10-CM

## 2022-12-21 DIAGNOSIS — M5441 Lumbago with sciatica, right side: Secondary | ICD-10-CM

## 2022-12-21 NOTE — Therapy (Signed)
OUTPATIENT PHYSICAL THERAPY THORACOLUMBAR TREATMENT   Patient Name: Rebecca Rocha MRN: [id number] DOB:2001/05/13, 22 y.o., female Today's Date: 12/21/2022  END OF SESSION:  PT End of Session - 12/21/22 1351     Visit Number 8    Date for PT Re-Evaluation 01/18/23    PT Start Time 1351    PT Stop Time 1430    PT Time Calculation (min) 39 min    Activity Tolerance Patient tolerated treatment well    Behavior During Therapy [facility] for tasks assessed/performed              Past Medical History:  Diagnosis Date   Acute calculous cholecystitis 11/26/2019   Allergy    Anxiety    Asthma    Chronic headache disorder    Chronic upper back pain    Depression    Menstrual migraine    Panic attack    Sinusitis    Sleep apnea    Sore of lip 11/24/2021   Syncope and collapse    Vertigo of central origin, unspecified ear    Past Surgical History:  Procedure Laterality Date   CHOLECYSTECTOMY N/A 11/26/2019   Procedure: LAPAROSCOPIC CHOLECYSTECTOMY;  Surgeon: Rodman Pickle, MD;  Location: [facility];  Service: General;  Laterality: N/A;   Patient Active Problem List   Diagnosis Date Noted   Chronic gastritis 10/31/2022   History of self mutilation 09/19/2021   Genetic testing 08/25/2021   Nausea without vomiting 07/07/2021   Generalized abdominal pain 07/07/2021   Chronic myofascial pain 04/15/2021   Breast mass, right 10/08/2020   Depression, recurrent (HCC) 08/20/2020   Insomnia 04/07/2020   Hepatic lesion 04/07/2020   S/P laparoscopic cholecystectomy 12/08/2019   Migraine 12/08/2019   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   Chronic right-sided low back pain 09/18/2019   Allergy to environmental factors 10/27/2016   Asthma 10/27/2016    PCP: Alysia Penna  REFERRING PROVIDER: Alysia Penna   REFERRING DIAG:  [phone] (ICD-10-CM) - Chronic right-sided low back pain with right-sided sciatica    Rationale for Evaluation and Treatment:  Rehabilitation  THERAPY DIAG:  Radiculopathy, lumbar region  Bilateral low back pain with right-sided sciatica, unspecified chronicity  Other low back pain  Muscle weakness (generalized)  ONSET DATE: 10/31/22   SUBJECTIVE:                                                                                                                                                                                           SUBJECTIVE STATEMENT: "Same pain" Has the appointment tomorrow with PCP  PERTINENT HISTORY:  Cholecystectomy 2021  PAIN:  Are you having pain? Yes: NPRS scale:  10/10 Pain location: R back, hip, and knee Pain description: pinching pain, sharp, shooting  Aggravating factors: sitting too long, walking too far  Relieving factors: exercises (3x a week, weights, walking, stretching)   PRECAUTIONS: None  WEIGHT BEARING RESTRICTIONS: No  FALLS:  Has patient fallen in last 6 months? 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  Extension WNL with pain WFL P!  Right lateral flexion Fib head with pain WFL   Left lateral flexion Fib head with pain WFL tiny pinch  Right rotation 75% with pain WFL  moderate P!  Left rotation 75% with pain WFL   (Blank rows = not tested)  LOWER EXTREMITY ROM:  grossly WFL   LOWER EXTREMITY MMT:  grossly 5/5 BLE   LUMBAR SPECIAL TESTS:  Straight leg  raise test: Positive and FABER test: Positive   TODAY'S TREATMENT:                                                                                                                              DATE: 12/21/22 NuStep L5 x Bike L 3 x 4 min  S2S OHP yellow ball 2x10 6in box on airex step ups x10 4in box on airex lateral step ups x10 Shoulder Ext blue 2x10 Standing Tband row blue 2x10  12/19/22 NuStep L4 x 6 min Bike L 2 x 6 min Rows & Lats 15lb 2x10 Leg press 20lb 2x12 HS curls 20lb 2x10 Leg Ext 5lb 2x10 Shoulder Ext 10lb 2x10 Stretching HS, piriformis, DKTC, SKTC, glutes, trunk rotations   12/05/22 NuStep L4 x 6 min Checked GOALS Shoulder Ext 10lb 2x10 AR press 15lb x0 each HS curls 25lb 2x15 Leg Ext 10lb 2x12 Leg Ext 5lb 2x10 Stretching HS, piriformis, DKTC, SKTC, glutes, trunk rotations   11/30/22 NuStep L5 x51mins  Calf stretch 30s  Stretching HS, piriformis, DKTC, SKTC, glutes, trunk rotations  STS with OHP 2x10 Leg ext 10# 2x10 HS curls 20# 2x10 Traction to lumbar spine 80# 10 mins   11/27/22 Bike L2 x Lats and rows 20lb 2x10 Leg press 40lb 2x10 Cable trunk rotation 20lb B 2x10 Standing hip ext R 10lb 2x10 Bridges/K2C/obliques with orange ball 2x10 PROM/stretching to R LE   11/23/22 NuStep L4 x 6  HS 20lb 2x10  Leg Ext 5lb 2x10 Rows & Lats 20lb 2x10  PROM  to LE with end range holds   11/14/22 NuStep L 3 x 6 min S2S 2x10  Shoulder Ext green 2x12 Seated Rows green 2x12 Bridges x10 LE on Pball bridges, Oblq, K2C PROM to LE with end range holds Light manual traction 3x15''  EVAL- 11/09/22    PATIENT EDUCATION:  Education details: POC and HEP Person educated: Patient Education method: Explanation Education comprehension: verbalized understanding  HOME EXERCISE PROGRAM: Access Code: [email or web address] URL: [email or web address] Date: 11/09/2022 Prepared by: Cassie Freer  Exercises - Supine Lower Trunk Rotation  - 1 x daily - 7 x  weekly - 2 sets - 10 reps - Supine Bridge  - 1 x daily - 7 x weekly - 2 sets - 10 reps - Clamshell with Resistance  - 1 x daily - 7 x weekly - 2 sets - 10 reps - Supine Piriformis Stretch  - 1 x daily - 7 x weekly - 2 sets - 30 hold  ASSESSMENT:  CLINICAL IMPRESSION: Patient is a 22 y.o. female who was seen today for physical therapy treatment for LBP. She continues to present with high pain levels. Session focused on total body strengthening. Pain remain 10/10 throughout sessio that moved from R low back to R knees depending on the interventions. Some R shoulder pain reported with rows and ext. Pt stated she was able to get a MD appointment tomorrow. Pt appears to be functionally independent completing all functional therapy interventions she just reports pain.   OBJECTIVE IMPAIRMENTS: difficulty walking and pain.   ACTIVITY LIMITATIONS: stairs and locomotion level  REHAB POTENTIAL: Good  CLINICAL DECISION MAKING: Stable/uncomplicated  EVALUATION COMPLEXITY: Low  GOALS: Goals reviewed with patient? Yes  SHORT TERM GOALS: Target date: 12/14/22  Patient will be independent with initial HEP.  Goal status: Met 12/05/22  2.  Patient will report centralization of radicular symptoms.  Baseline: radiates into RLE Goal status: improved 50%    LONG TERM GOALS: Target date: 01/18/23  Patient will be independent with advanced/ongoing HEP to improve outcomes and carryover.  Goal status: On going  2.  Patient will report 75% improvement in low back pain to improve QOL.  Baseline: 9.5/10 pain Goal status: INITIAL  3.  Patient will demonstrate full pain free lumbar ROM to perform ADLs.   Baseline: pain with movements, see chart Goal status: Has met PROM porting of goal, still has some pain  4.  Patient will tolerate 1 hour of (standing/sitting/walking) to perform ADLs without pain. Baseline: pain with prolonged sitting or walking  Goal status: On going   PLAN:  PT FREQUENCY:  1-2x/week  PT DURATION: 10 weeks  PLANNED INTERVENTIONS: Therapeutic exercises, Therapeutic activity, Neuromuscular re-education, Balance training, Gait training, Patient/Family education, Self Care, Joint mobilization, Joint manipulation, Dry Needling, Electrical stimulation, Spinal manipulation, Spinal mobilization, Cryotherapy, Moist heat, Traction, Ionotophoresis 4mg /ml Dexamethasone, and Manual therapy.  PLAN FOR NEXT SESSION: Get report from MD   Grayce Sessions, PTA, DPT 12/21/2022, 1:52 PM

## 2022-12-22 ENCOUNTER — Ambulatory Visit (INDEPENDENT_AMBULATORY_CARE_PROVIDER_SITE_OTHER): Payer: 59 | Admitting: Nurse Practitioner

## 2022-12-22 VITALS — BP 110/82 | HR 68 | Temp 98.5°F | Resp 16 | Ht 60.0 in | Wt 144.4 lb

## 2022-12-22 DIAGNOSIS — M5441 Lumbago with sciatica, right side: Secondary | ICD-10-CM | POA: Diagnosis not present

## 2022-12-22 DIAGNOSIS — G8929 Other chronic pain: Secondary | ICD-10-CM

## 2022-12-22 DIAGNOSIS — M62838 Other muscle spasm: Secondary | ICD-10-CM

## 2022-12-22 MED ORDER — PREDNISONE 10 MG (21) PO TBPK
ORAL_TABLET | ORAL | 0 refills | Status: DC
Start: 2022-12-22 — End: 2023-01-05

## 2022-12-22 MED ORDER — CYCLOBENZAPRINE HCL 10 MG PO TABS
10.0000 mg | ORAL_TABLET | Freq: Every day | ORAL | 0 refills | Status: AC
Start: 2022-12-22 — End: ?

## 2022-12-22 NOTE — Patient Instructions (Signed)
Continue PT sessions Use warm compress as needed Resume oral prednisone and flexeril as prescribed  Acute Back Pain, Adult Acute back pain is sudden and usually short-lived. It is often caused by an injury to the muscles and tissues in the back. The injury may result from: A muscle, tendon, or ligament getting overstretched or torn. Ligaments are tissues that connect bones to each other. Lifting something improperly can cause a back strain. Wear and tear (degeneration) of the spinal disks. Spinal disks are circular tissue that provide cushioning between the bones of the spine (vertebrae). Twisting motions, such as while playing sports or doing yard work. A hit to the back. Arthritis. You may have a physical exam, lab tests, and imaging tests to find the cause of your pain. Acute back pain usually goes away with rest and home care. Follow these instructions at home: Managing pain, stiffness, and swelling Take over-the-counter and prescription medicines only as told by your health care provider. Treatment may include medicines for pain and inflammation that are taken by mouth or applied to the skin, or muscle relaxants. Your health care provider may recommend applying ice during the first 24-48 hours after your pain starts. To do this: Put ice in a plastic bag. Place a towel between your skin and the bag. Leave the ice on for 20 minutes, 2-3 times a day. Remove the ice if your skin turns bright red. This is very important. If you cannot feel pain, heat, or cold, you have a greater risk of damage to the area. If directed, apply heat to the affected area as often as told by your health care provider. Use the heat source that your health care provider recommends, such as a moist heat pack or a heating pad. Place a towel between your skin and the heat source. Leave the heat on for 20-30 minutes. Remove the heat if your skin turns bright red. This is especially important if you are unable to feel  pain, heat, or cold. You have a greater risk of getting burned. Activity  Do not stay in bed. Staying in bed for more than 1-2 days can delay your recovery. Sit up and stand up straight. Avoid leaning forward when you sit or hunching over when you stand. If you work at a desk, sit close to it so you do not need to lean over. Keep your chin tucked in. Keep your neck drawn back, and keep your elbows bent at a 90-degree angle (right angle). Sit high and close to the steering wheel when you drive. Add lower back (lumbar) support to your car seat, if needed. Take short walks on even surfaces as soon as you are able. Try to increase the length of time you walk each day. Do not sit, drive, or stand in one place for more than 30 minutes at a time. Sitting or standing for long periods of time can put stress on your back. Do not drive or use heavy machinery while taking prescription pain medicine. Use proper lifting techniques. When you bend and lift, use positions that put less stress on your back: [place] your knees. Keep the load close to your body. Avoid twisting. Exercise regularly as told by your health care provider. Exercising helps your back heal faster and helps prevent back injuries by keeping muscles strong and flexible. Work with a physical therapist to make a safe exercise program, as recommended by your health care provider. Do any exercises as told by your physical therapist. Lifestyle Maintain a  healthy weight. Extra weight puts stress on your back and makes it difficult to have good posture. Avoid activities or situations that make you feel anxious or stressed. Stress and anxiety increase muscle tension and can make back pain worse. Learn ways to manage anxiety and stress, such as through exercise. General instructions Sleep on a firm mattress in a comfortable position. Try lying on your side with your knees slightly bent. If you lie on your back, put a pillow under your knees. Keep your  head and neck in a straight line with your spine (neutral position) when using electronic equipment like smartphones or pads. To do this: Raise your smartphone or pad to look at it instead of bending your head or neck to look down. Put the smartphone or pad at the level of your face while looking at the screen. Follow your treatment plan as told by your health care provider. This may include: Cognitive or behavioral therapy. Acupuncture or massage therapy. Meditation or yoga. Contact a health care provider if: You have pain that is not relieved with rest or medicine. You have increasing pain going down into your legs or buttocks. Your pain does not improve after 2 weeks. You have pain at night. You lose weight without trying. You have a fever or chills. You develop nausea or vomiting. You develop abdominal pain. Get help right away if: You develop new bowel or bladder control problems. You have unusual weakness or numbness in your arms or legs. You feel faint. These symptoms may represent a serious problem that is an emergency. Do not wait to see if the symptoms will go away. Get medical help right away. Call your local emergency services (911 in the [place] Do not drive yourself to the hospital. Summary Acute back pain is sudden and usually short-lived. Use proper lifting techniques. When you bend and lift, use positions that put less stress on your back. Take over-the-counter and prescription medicines only as told by your health care provider, and apply heat or ice as told. This information is not intended to replace advice given to you by your health care provider. Make sure you discuss any questions you have with your health care provider. Document Revised: 07/23/2020 Document Reviewed: 07/23/2020 Elsevier Patient Education  2024 [employer].

## 2022-12-22 NOTE — Assessment & Plan Note (Addendum)
Ongoing outpatient PT for chronic lower back pain with right side radiculopathy, reports improvement in symptoms. Today she reports intermittent sharp generalized back pain 2weeks ago.No specific trigger. Not affected by back exercise. Denies any injury. No paresthesia or weakness or rash.  Normal exam today Myofascial pain Sent prednisone dose pack and muscle relaxant Continue PT sessions Use warm compress as needed Consider use of cymbalta or elavil or gabapentin if pain persist.

## 2022-12-22 NOTE — Progress Notes (Signed)
Established Patient Visit  Patient: Rebecca Rocha   DOB: 2000/05/25   22 y.o. Female  MRN: [id number] Visit Date: 12/22/2022  Subjective:    Chief Complaint  Patient presents with   Follow-up    Pt states" I am having new pain.  The pain is in my spine starting from my neck all the way down.  It feels like an electrical shock.  This happened four times for two to three weeks    HPI Chronic right-sided low back pain Ongoing outpatient PT for chronic lower back pain with right side radiculopathy, reports improvement in symptoms. Today she reports intermittent sharp generalized back pain 2weeks ago.No specific trigger. Not affected by back exercise. Denies any injury. No paresthesia or weakness or rash.  Normal exam today Myofascial pain Sent prednisone dose pack and muscle relaxant Continue PT sessions Use warm compress as needed Consider use of cymbalta or elavil or gabapentin if pain persist.  Reviewed medical, surgical, and social history today  Medications: Outpatient Medications Prior to Visit  Medication Sig   albuterol (PROVENTIL) (2.5 MG/3ML) 0.083% nebulizer solution Take 3 mLs (2.5 mg total) by nebulization every 6 (six) hours as needed for wheezing or shortness of breath.   albuterol (VENTOLIN HFA) 108 (90 Base) MCG/ACT inhaler INHALE 1 TO 2 PUFFS BY MOUTH EVERY 6 HOURS AS NEEDED FOR WHEEZING OR SHORTNESS OF BREATH   fluticasone (FLOVENT HFA) 110 MCG/ACT inhaler Inhale 1 puff into the lungs 2 (two) times daily. Rinse mouth after each use   omeprazole (PRILOSEC) 20 MG capsule Take 1 capsule (20 mg total) by mouth 2 (two) times daily before a meal.   ondansetron (ZOFRAN-ODT) 4 MG disintegrating tablet Take 1 tablet (4 mg total) by mouth every 8 (eight) hours as needed.   [DISCONTINUED] cyclobenzaprine (FLEXERIL) 5 MG tablet Take 1 tablet (5 mg total) by mouth at bedtime.   No facility-administered medications prior to visit.   Reviewed past  medical and social history.   ROS per HPI above      Objective:  BP 110/82 (BP Location: Left Arm, Patient Position: Sitting, Cuff Size: Normal)   Pulse 68   Temp 98.5 F (36.9 C) (Temporal)   Resp 16   Ht 5' (1.524 m)   Wt 144 lb 6.4 oz (65.5 kg)   SpO2 98%   BMI 28.20 kg/m      Physical Exam Vitals and nursing note reviewed.  Cardiovascular:     Rate and Rhythm: Normal rate.     Pulses: Normal pulses.  Pulmonary:     Effort: Pulmonary effort is normal.  Musculoskeletal:        General: Tenderness present.     Cervical back: Tenderness present. No torticollis or bony tenderness. No pain with movement. Normal range of motion.     Thoracic back: Tenderness present. No spasms. Normal range of motion.     Lumbar back: Tenderness present. No spasms. Normal range of motion. Negative right straight leg raise test and negative left straight leg raise test.     Right hip: Normal.     Left hip: Normal.     Right upper leg: Normal.     Left upper leg: Normal.     Right lower leg: Normal. No edema.     Left lower leg: Normal. No edema.  Skin:    General: Skin is warm and dry.     Findings: No rash.  Neurological:     Mental Status: She is alert and oriented to person, place, and time.     No results found for any visits on 12/22/22.    Assessment & Plan:    Problem List Items Addressed This Visit       Other   Chronic right-sided low back pain - Primary    Ongoing outpatient PT for chronic lower back pain with right side radiculopathy, reports improvement in symptoms. Today she reports intermittent sharp generalized back pain 2weeks ago.No specific trigger. Not affected by back exercise. Denies any injury. No paresthesia or weakness or rash.  Normal exam today Myofascial pain Sent prednisone dose pack and muscle relaxant Continue PT sessions Use warm compress as needed Consider use of cymbalta or elavil or gabapentin if pain persist.      Relevant Medications    predniSONE (STERAPRED UNI-PAK 21 TAB) 10 MG (21) TBPK tablet   cyclobenzaprine (FLEXERIL) 10 MG tablet   Other Visit Diagnoses     Muscle spasm       Relevant Medications   predniSONE (STERAPRED UNI-PAK 21 TAB) 10 MG (21) TBPK tablet   cyclobenzaprine (FLEXERIL) 10 MG tablet      Return if symptoms worsen or fail to improve.     Alysia Penna, NP

## 2022-12-27 ENCOUNTER — Ambulatory Visit: Payer: 59 | Admitting: Physical Therapy

## 2022-12-27 ENCOUNTER — Encounter: Payer: Self-pay | Admitting: Physical Therapy

## 2022-12-27 DIAGNOSIS — M5416 Radiculopathy, lumbar region: Secondary | ICD-10-CM | POA: Diagnosis not present

## 2022-12-27 DIAGNOSIS — M5441 Lumbago with sciatica, right side: Secondary | ICD-10-CM

## 2022-12-27 DIAGNOSIS — M5459 Other low back pain: Secondary | ICD-10-CM

## 2022-12-27 DIAGNOSIS — M6281 Muscle weakness (generalized): Secondary | ICD-10-CM

## 2022-12-27 NOTE — Therapy (Signed)
OUTPATIENT PHYSICAL THERAPY THORACOLUMBAR TREATMENT   Patient Name: Rebecca Rocha MRN: [id number] DOB:04-07-2001, 22 y.o., female Today's Date: 12/27/2022  END OF SESSION:  PT End of Session - 12/27/22 1108     Visit Number 9    Date for PT Re-Evaluation 01/18/23    PT Start Time 1108    PT Stop Time 1145    PT Time Calculation (min) 37 min    Activity Tolerance Patient tolerated treatment well    Behavior During Therapy [facility] for tasks assessed/performed              Past Medical History:  Diagnosis Date   Acute calculous cholecystitis 11/26/2019   Allergy    Anxiety    Asthma    Chronic headache disorder    Chronic upper back pain    Depression    Menstrual migraine    Panic attack    Sinusitis    Sleep apnea    Sore of lip 11/24/2021   Syncope and collapse    Vertigo of central origin, unspecified ear    Past Surgical History:  Procedure Laterality Date   CHOLECYSTECTOMY N/A 11/26/2019   Procedure: LAPAROSCOPIC CHOLECYSTECTOMY;  Surgeon: Rodman Pickle, MD;  Location: [facility];  Service: General;  Laterality: N/A;   Patient Active Problem List   Diagnosis Date Noted   Chronic gastritis 10/31/2022   History of self mutilation 09/19/2021   Genetic testing 08/25/2021   Nausea without vomiting 07/07/2021   Generalized abdominal pain 07/07/2021   Chronic myofascial pain 04/15/2021   Breast mass, right 10/08/2020   Depression, recurrent (HCC) 08/20/2020   Insomnia 04/07/2020   Hepatic lesion 04/07/2020   S/P laparoscopic cholecystectomy 12/08/2019   Migraine 12/08/2019   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   Chronic right-sided low back pain 09/18/2019   Allergy to environmental factors 10/27/2016   Asthma 10/27/2016    PCP: Alysia Penna  REFERRING PROVIDER: Alysia Penna   REFERRING DIAG:  [phone] (ICD-10-CM) - Chronic right-sided low back pain with right-sided sciatica    Rationale for Evaluation and Treatment:  Rehabilitation  THERAPY DIAG:  Radiculopathy, lumbar region  Bilateral low back pain with right-sided sciatica, unspecified chronicity  Muscle weakness (generalized)  Other low back pain  ONSET DATE: 10/31/22   SUBJECTIVE:                                                                                                                                                                                           SUBJECTIVE STATEMENT: "No too good today"  PERTINENT HISTORY:  Cholecystectomy 2021  PAIN:  Are you having pain? Yes: NPRS scale: 10/10 Pain location: R  back, hip, and knee Pain description: pinching pain, sharp, shooting  Aggravating factors: sitting too long, walking too far  Relieving factors: exercises (3x a week, weights, walking, stretching)   PRECAUTIONS: None  WEIGHT BEARING RESTRICTIONS: No  FALLS:  Has patient fallen in last 6 months? 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  Extension WNL with pain WFL P!  Right lateral flexion Fib head with pain WFL   Left lateral flexion Fib head with pain WFL tiny pinch  Right rotation 75% with pain WFL  moderate P!  Left rotation 75% with pain WFL   (Blank rows = not tested)  LOWER EXTREMITY ROM:  grossly WFL   LOWER EXTREMITY MMT:  grossly 5/5 BLE   LUMBAR SPECIAL TESTS:  Straight leg raise test: Positive and FABER  test: Positive   TODAY'S TREATMENT:                                                                                                                              DATE: 12/27/22 NuStep L 5 x 6 min Passive stretching to hamstrings, single K2C, ITB, Glute, lower trunk rotations, double K2C, Double K2C with rotation.  12/21/22 NuStep L5 x Bike L 3 x 4 min  S2S OHP yellow ball 2x10 6in box on airex step ups x10 4in box on airex lateral step ups x10 Shoulder Ext blue 2x10 Standing Tband row blue 2x10  12/19/22 NuStep L4 x 6 min Bike L 2 x 6 min Rows & Lats 15lb 2x10 Leg press 20lb 2x12 HS curls 20lb 2x10 Leg Ext 5lb 2x10 Shoulder Ext 10lb 2x10 Stretching HS, piriformis, DKTC, SKTC, glutes, trunk rotations   12/05/22 NuStep L4 x 6 min Checked GOALS Shoulder Ext 10lb 2x10 AR press 15lb x0 each HS curls 25lb 2x15 Leg Ext 10lb 2x12 Leg Ext 5lb 2x10 Stretching HS, piriformis, DKTC, SKTC, glutes, trunk rotations   11/30/22 NuStep L5 x77mins  Calf stretch 30s  Stretching HS, piriformis, DKTC, SKTC, glutes, trunk rotations  STS with OHP 2x10 Leg ext 10# 2x10 HS curls 20# 2x10 Traction to lumbar spine 80# 10 mins   11/27/22 Bike L2 x Lats and rows 20lb 2x10 Leg press 40lb 2x10 Cable trunk rotation 20lb B 2x10 Standing hip ext R 10lb 2x10 Bridges/K2C/obliques with orange ball 2x10 PROM/stretching to R LE  11/23/22 NuStep L4 x 6  HS 20lb 2x10  Leg Ext 5lb 2x10 Rows & Lats 20lb 2x10  PROM to LE with end range holds   11/14/22 NuStep L 3 x 6 min S2S 2x10  Shoulder Ext green 2x12 Seated Rows green 2x12 Bridges x10 LE on Pball bridges, Oblq, K2C PROM to LE with end range holds Light manual traction 3x15''  EVAL- 11/09/22    PATIENT EDUCATION:  Education details: POC and HEP Person educated: Patient Education method: Explanation Education comprehension: verbalized understanding  HOME EXERCISE PROGRAM: Access Code: [email or web address] URL:  [email or web address] Date: 11/09/2022 Prepared by: Cassie Freer  Exercises - Supine Lower Trunk Rotation  - 1 x daily - 7 x weekly - 2 sets - 10 reps - Supine Bridge  - 1 x daily - 7 x weekly - 2 sets - 10 reps - Clamshell with Resistance  - 1 x daily - 7 x weekly - 2 sets - 10 reps - Supine Piriformis Stretch  - 1 x daily - 7 x weekly - 2 sets - 30 hold  ASSESSMENT:  CLINICAL IMPRESSION: Patient is a 22 y.o. female who was seen today for physical therapy treatment for LBP. She enters ~ 8 min late. She reports a recent MD appointment and was given prednisone nut has had no relief.  She continues to have high pain levels. She reports that's stretching helpes the most. Session focused mostly on stretching.  OBJECTIVE IMPAIRMENTS: difficulty walking and pain.   ACTIVITY LIMITATIONS: stairs and locomotion level  REHAB POTENTIAL: Good  CLINICAL DECISION MAKING: Stable/uncomplicated  EVALUATION COMPLEXITY: Low  GOALS: Goals reviewed with patient? Yes  SHORT TERM GOALS: Target date: 12/14/22  Patient will be independent with initial HEP.  Goal status: Met 12/05/22  2.  Patient will report centralization of radicular symptoms.  Baseline: radiates into RLE Goal status: improved 50%    LONG TERM GOALS: Target date: 01/18/23  Patient will be independent with advanced/ongoing HEP to improve outcomes and carryover.  Goal status: On going  2.  Patient will report 75% improvement in low back pain to improve QOL.  Baseline: 9.5/10 pain Goal status: INITIAL  3.  Patient will demonstrate full pain free lumbar ROM to perform ADLs.   Baseline: pain with movements, see chart Goal status: Has met PROM porting of goal, still has some pain  4.  Patient will tolerate 1 hour of (standing/sitting/walking) to perform ADLs without pain. Baseline: pain with prolonged sitting or walking  Goal status: On going   PLAN:  PT FREQUENCY: 1-2x/week  PT DURATION: 10 weeks  PLANNED  INTERVENTIONS: Therapeutic exercises, Therapeutic activity, Neuromuscular re-education, Balance training, Gait training, Patient/Family education, Self Care, Joint mobilization, Joint manipulation, Dry Needling, Electrical stimulation, Spinal manipulation, Spinal mobilization, Cryotherapy, Moist heat, Traction, Ionotophoresis 4mg /ml Dexamethasone, and Manual therapy.  PLAN FOR NEXT SESSION: Get report from MD   Grayce Sessions, PTA, DPT 12/27/2022, 11:09 AM

## 2023-01-03 ENCOUNTER — Ambulatory Visit: Payer: 59

## 2023-01-05 ENCOUNTER — Other Ambulatory Visit (INDEPENDENT_AMBULATORY_CARE_PROVIDER_SITE_OTHER): Payer: 59

## 2023-01-05 ENCOUNTER — Encounter: Payer: Self-pay | Admitting: Gastroenterology

## 2023-01-05 ENCOUNTER — Ambulatory Visit (INDEPENDENT_AMBULATORY_CARE_PROVIDER_SITE_OTHER): Payer: 59 | Admitting: Gastroenterology

## 2023-01-05 VITALS — BP 92/58 | HR 72 | Ht 60.0 in | Wt 143.1 lb

## 2023-01-05 DIAGNOSIS — R197 Diarrhea, unspecified: Secondary | ICD-10-CM | POA: Diagnosis not present

## 2023-01-05 DIAGNOSIS — R112 Nausea with vomiting, unspecified: Secondary | ICD-10-CM

## 2023-01-05 DIAGNOSIS — R109 Unspecified abdominal pain: Secondary | ICD-10-CM

## 2023-01-05 LAB — CBC WITH DIFFERENTIAL/PLATELET
Basophils Absolute: 0.1 10*3/uL (ref 0.0–0.1)
Basophils Relative: 0.7 % (ref 0.0–3.0)
Eosinophils Absolute: 0.1 10*3/uL (ref 0.0–0.7)
Eosinophils Relative: 1.6 % (ref 0.0–5.0)
HCT: 38.8 % (ref 36.0–46.0)
Hemoglobin: 12.4 g/dL (ref 12.0–15.0)
Lymphocytes Relative: 34.3 % (ref 12.0–46.0)
Lymphs Abs: 2.7 10*3/uL (ref 0.7–4.0)
MCHC: 32 g/dL (ref 30.0–36.0)
MCV: 94.2 fl (ref 78.0–100.0)
Monocytes Absolute: 0.6 10*3/uL (ref 0.1–1.0)
Monocytes Relative: 7.3 % (ref 3.0–12.0)
Neutro Abs: 4.4 10*3/uL (ref 1.4–7.7)
Neutrophils Relative %: 56.1 % (ref 43.0–77.0)
Platelets: 347 10*3/uL (ref 150.0–400.0)
RBC: 4.12 Mil/uL (ref 3.87–5.11)
RDW: 12.4 % (ref 11.5–15.5)
WBC: 7.9 10*3/uL (ref 4.0–10.5)

## 2023-01-05 LAB — COMPREHENSIVE METABOLIC PANEL
ALT: 12 U/L (ref 0–35)
AST: 13 U/L (ref 0–37)
Albumin: 4 g/dL (ref 3.5–5.2)
Alkaline Phosphatase: 64 U/L (ref 39–117)
BUN: 12 mg/dL (ref 6–23)
CO2: 27 mEq/L (ref 19–32)
Calcium: 9.5 mg/dL (ref 8.4–10.5)
Chloride: 106 mEq/L (ref 96–112)
Creatinine, Ser: 0.72 mg/dL (ref 0.40–1.20)
GFR: 117.13 mL/min (ref >60.00)
Glucose, Bld: 90 mg/dL (ref 70–99)
Potassium: 4.1 mEq/L (ref 3.5–5.1)
Sodium: 139 mEq/L (ref 135–145)
Total Bilirubin: 0.4 mg/dL (ref 0.2–1.2)
Total Protein: 7.2 g/dL (ref 6.0–8.3)

## 2023-01-05 LAB — TSH: TSH: 0.59 u[IU]/mL (ref 0.35–5.50)

## 2023-01-05 NOTE — Progress Notes (Signed)
Chief Complaint: nausea, vomiting and diarrhea Primary GI MD: Dr. Chales Abrahams  HPI: 22 year old female presents for evaluation of pain, nausea, vomiting.  Last seen 06/2021 by Doug Sou PA-C At that time she had daily nausea, occasional vomiting, generalized abdominal pain and diarrhea twice daily.  She also had a positive C. difficile toxin but was not treated because her fecal calprotectin was normal.  Celiac labs negative.  Inflammatory markers negative.  CT scan of the abdomen pelvis was normal with no biliary ductal dilation and normal LFTs.  Patient was even Bentyl and recommended to repeat C. difficile stool study.  Appears stool study was not completed.  History of cholecystectomy 11/2019.  Patient states she is having lower abdominal pain around her laparoscopic incision above her navel that has been ongoing since her surgery in 2021.  States nothing makes it worse.  States she does smoke marijuana and this the only thing that makes her pain better.  She does not smoke daily.  She reports her husband uses marijuana daily.  She states she went 3 weeks without smoking marijuana and none of her symptoms improved.  When she smoked marijuana again her abdominal pain improved.  She also reports daily nausea that is constant, no triggers.  Can sometimes feel better with a hot shower.  Will vomit "in a blue moon".  Not necessarily associated with eating.  She also reports diarrhea that occurs 3 times per day, watery, typically occurs after meals.  States she thought she "smelled a bit of C. difficile earlier this week".  States it is green-colored.  She also reports occasional oily/greasy/floating stools.  Social alcohol use.  Rare tobacco use.  Denies NSAID use.  PREVIOUS GI WORKUP   Colonoscopy 06/2020:   06/2020 showed non-bleeding internal hemorrhoids.   EGD 06/2020 was normal.   1. Surgical [P], duodenal biopsies - DUODENAL MUCOSA WITH NO SIGNIFICANT PATHOLOGIC FINDINGS. - NEGATIVE FOR  INCREASED INTRAEPITHELIAL LYMPHOCYTES AND VILLOUS ARCHITECTURAL CHANGES. 2. Surgical [P], gastric antrum and gastric body - MILD CHRONIC GASTRITIS. - WARTHIN-STARRY STAIN IS NEGATIVE FOR HELICOBACTER PYLORI.  Past Medical History:  Diagnosis Date   Acute calculous cholecystitis 11/26/2019   Allergy    Anxiety    Asthma    Chronic headache disorder    Chronic upper back pain    Depression    Menstrual migraine    Panic attack    Sinusitis    Sleep apnea    Sore of lip 11/24/2021   Syncope and collapse    Vertigo of central origin, unspecified ear     Past Surgical History:  Procedure Laterality Date   CHOLECYSTECTOMY N/A 11/26/2019   Procedure: LAPAROSCOPIC CHOLECYSTECTOMY;  Surgeon: Rodman Pickle, MD;  Location: [facility];  Service: General;  Laterality: N/A;    Current Outpatient Medications  Medication Sig Dispense Refill   albuterol (PROVENTIL) (2.5 MG/3ML) 0.083% nebulizer solution Take 3 mLs (2.5 mg total) by nebulization every 6 (six) hours as needed for wheezing or shortness of breath. 100 mL 0   albuterol (VENTOLIN HFA) 108 (90 Base) MCG/ACT inhaler INHALE 1 TO 2 PUFFS BY MOUTH EVERY 6 HOURS AS NEEDED FOR WHEEZING OR SHORTNESS OF BREATH 8.5 g 2   cyclobenzaprine (FLEXERIL) 10 MG tablet Take 1 tablet (10 mg total) by mouth at bedtime. 14 tablet 0   fluticasone (FLOVENT HFA) 110 MCG/ACT inhaler Inhale 1 puff into the lungs 2 (two) times daily. Rinse mouth after each use 1 each 5   omeprazole (PRILOSEC) 20  MG capsule Take 1 capsule (20 mg total) by mouth 2 (two) times daily before a meal. 30 capsule 0   ondansetron (ZOFRAN-ODT) 4 MG disintegrating tablet Take 1 tablet (4 mg total) by mouth every 8 (eight) hours as needed. 20 tablet 0   predniSONE (STERAPRED UNI-PAK 21 TAB) 10 MG (21) TBPK tablet As directed on package 21 tablet 0   No current facility-administered medications for this visit.    Allergies as of 01/05/2023   (No Known Allergies)    Family History   Problem Relation Age of Onset   Hypertension Mother    Liver disease Mother    Anxiety disorder Mother    Kidney disease Mother    Other Mother        Negative hereditary cancer genetic testing   Hypertension Father    Diabetes Father    Anxiety disorder Father    Anxiety disorder Sister    Depression Sister    Breast cancer Maternal Grandmother 82   Diabetes Paternal Grandmother    Hypertension Paternal Grandmother    High Cholesterol Paternal Grandmother    Cancer Paternal Grandfather    Diabetes Paternal Grandfather    Hypertension Paternal Grandfather    High Cholesterol Paternal Grandfather    Other Cousin        maternal second cousin, BRCA2+   Esophageal cancer Neg Hx    Rectal cancer Neg Hx    Stomach cancer Neg Hx     Social History   Socioeconomic History   Marital status: Married    Spouse name: Not on file   Number of children: 0   Years of education: Not on file   Highest education level: Associate degree: occupational, Scientist, product/process development, or vocational program  Occupational History   Not on file  Tobacco Use   Smoking status: Never   Smokeless tobacco: Never  Vaping Use   Vaping status: Never Used  Substance and Sexual Activity   Alcohol use: Yes    Alcohol/week: 2.0 standard drinks of alcohol    Types: 1 Cans of beer, 1 Shots of liquor per week    Comment: occasionally   Drug use: No   Sexual activity: Yes    Partners: Male    Birth control/protection: Injection    Comment: 1st intercourse- 15, partners- 5, current partnER- 4 YRS  Other Topics Concern   Not on file  Social History Narrative   Consulting civil engineer at [employer]   Financial Resource Strain: Low Risk  (12/22/2022)   Overall Financial Resource Strain (CARDIA)    Difficulty of Paying Living Expenses: Not hard at all  Food Insecurity: Food Insecurity Present (12/22/2022)   Hunger Vital Sign    Worried About Running Out of Food in the Last Year: Sometimes true    Ran Out of  Food in the Last Year: Never true  Transportation Needs: No Transportation Needs (12/22/2022)   PRAPARE - Administrator, Civil Service (Medical): No    Lack of Transportation (Non-Medical): No  Physical Activity: Sufficiently Active (12/22/2022)   Exercise Vital Sign    Days of Exercise per Week: 6 days    Minutes of Exercise per Session: 140 min  Stress: Stress Concern Present (12/22/2022)   [employer] of Occupational Health - Occupational Stress Questionnaire    Feeling of Stress : Very much  Social Connections: Unknown (12/22/2022)   Social Connection and Isolation Panel [NHANES]    Frequency of Communication with Friends and  Family: More than three times a week    Frequency of Social Gatherings with Friends and Family: More than three times a week    Attends Religious Services: Patient declined    Database administrator or Organizations: No    Attends Engineer, structural: Not on file    Marital Status: Married  Catering manager Violence: Not on file    Review of Systems:    Constitutional: No weight loss, fever, chills, weakness or fatigue HEENT: Eyes: No change in vision               Ears, Nose, Throat:  No change in hearing or congestion Skin: No rash or itching Cardiovascular: No chest pain, chest pressure or palpitations   Respiratory: No SOB or cough Gastrointestinal: See HPI and otherwise negative Genitourinary: No dysuria or change in urinary frequency Neurological: No headache, dizziness or syncope Musculoskeletal: No new muscle or joint pain Hematologic: No bleeding or bruising Psychiatric: No history of depression or anxiety    Physical Exam:  Vital signs: Ht 5' (1.524 m)   Wt 64.9 kg   BMI 27.95 kg/m   Constitutional: NAD, Well developed, Well nourished, alert and cooperative Head:  Normocephalic and atraumatic. Eyes:   PEERL, EOMI. No icterus. Conjunctiva pink. Respiratory: Respirations even and unlabored. Lungs clear to  auscultation bilaterally.   No wheezes, crackles, or rhonchi.  Cardiovascular:  Regular rate and rhythm. No peripheral edema, cyanosis or pallor.  Gastrointestinal:  Soft, nondistended, nontender. No rebound or guarding. Normal bowel sounds. No appreciable masses or hepatomegaly.  Laparoscopic incision noted above navel, healed well. Rectal:  Not performed.  Msk:  Symmetrical without gross deformities. Without edema, no deformity or joint abnormality.  Neurologic:  Alert and  oriented x4;  grossly normal neurologically.  Skin:   Dry and intact without significant lesions or rashes. Psychiatric: Oriented to person, place and time. Demonstrates good judgement and reason without abnormal affect or behaviors.   RELEVANT LABS AND IMAGING: CBC    Component Value Date/Time   WBC 6.1 12/17/2021 0021   RBC 4.27 12/17/2021 0021   HGB 12.8 12/17/2021 0021   HCT 39.8 12/17/2021 0021   PLT 332 12/17/2021 0021   MCV 93.2 12/17/2021 0021   MCH 30.0 12/17/2021 0021   MCHC 32.2 12/17/2021 0021   RDW 11.9 12/17/2021 0021   LYMPHSABS 2.0 12/17/2021 0021   MONOABS 0.6 12/17/2021 0021   EOSABS 0.0 12/17/2021 0021   BASOSABS 0.0 12/17/2021 0021    CMP     Component Value Date/Time   NA 140 07/20/2022 1425   K 3.9 07/20/2022 1425   CL 106 07/20/2022 1425   CO2 26 07/20/2022 1425   GLUCOSE 80 07/20/2022 1425   BUN 9 07/20/2022 1425   CREATININE 0.75 07/20/2022 1425   CALCIUM 9.7 07/20/2022 1425   PROT 7.5 07/20/2022 1425   ALBUMIN 4.1 07/20/2022 1425   AST 19 07/20/2022 1425   ALT 22 07/20/2022 1425   ALKPHOS 82 07/20/2022 1425   BILITOT 0.5 07/20/2022 1425   GFRNONAA >60 12/17/2021 0021   GFRAA >60 11/26/2019 7829     Assessment/Plan:   Abdominal pain, unspecified abdominal location Nausea and vomiting, unspecified vomiting type Diarrhea, unspecified type Multiple etiologies for symptoms including cannabinoid hyperemesis syndrome, C. difficile (history January 2023-untreated),  pancreatic insufficiency with presence of steatorrhea.  Also, suspect all of patient's symptoms could also be IBS-C with overflow diarrhea.  Patient's diarrhea could also be a side effect from her  cholecystectomy as well. --- CBC, CMP, TSH --- Pancreatic fecal elastase --- GI pathogen panel and C. difficile PCR/toxin --- If the above is negative would recommend KUB for evaluation of stool burden --- Provided IBgard samples to use for abdominal pain. --- Discussed cannabinoid hyperemesis syndrome with patient and advised trial of cessation of marijuana --- follow up based on diagnostic results  Donzetta [facility] 01/05/2023, 10:41 AM  Cc: Anne Ng, NP

## 2023-01-05 NOTE — Patient Instructions (Signed)
Your provider has requested that you go to the basement level for lab work before leaving today. Press "B" on the elevator. The lab is located at the first door on the left as you exit the elevator.   Follow up per recommendations after labs  Due to recent changes in healthcare laws, you may see the results of your imaging and laboratory studies on MyChart before your provider has had a chance to review them.  We understand that in some cases there may be results that are confusing or concerning to you. Not all laboratory results come back in the same time frame and the provider may be waiting for multiple results in order to interpret others.  Please give [place] 48 hours in order for your provider to thoroughly review all the results before contacting the office for clarification of your results.    _______________________________________________________  If your blood pressure at your visit was 140/90 or greater, please contact your primary care physician to follow up on this.  _______________________________________________________  If you are age 61 or older, your body mass index should be between 23-30. Your Body mass index is 27.95 kg/m. If this is out of the aforementioned range listed, please consider follow up with your Primary Care Provider.  If you are age 12 or younger, your body mass index should be between 19-25. Your Body mass index is 27.95 kg/m. If this is out of the aformentioned range listed, please consider follow up with your Primary Care Provider.   ________________________________________________________  The [facility] providers would like to encourage you to use [facility] to communicate with providers for non-urgent requests or questions.  Due to long hold times on the telephone, sending your provider a message by [facility] may be a faster and more efficient way to get a response.  Please allow 48 business hours for a response.  Please remember that this is for non-urgent  requests.  _______________________________________________________   I appreciate the  opportunity to care for you  Thank You   [facility]

## 2023-01-07 NOTE — Progress Notes (Signed)
Agree with assessment/plan.  Raj Gupta, MD [facility] [phone]  

## 2023-01-09 ENCOUNTER — Other Ambulatory Visit: Payer: 59

## 2023-01-09 DIAGNOSIS — R112 Nausea with vomiting, unspecified: Secondary | ICD-10-CM

## 2023-01-09 DIAGNOSIS — R197 Diarrhea, unspecified: Secondary | ICD-10-CM

## 2023-01-09 DIAGNOSIS — R109 Unspecified abdominal pain: Secondary | ICD-10-CM

## 2023-01-10 ENCOUNTER — Ambulatory Visit: Payer: 59

## 2023-01-11 LAB — CLOSTRIDIUM DIFFICILE BY PCR: Toxigenic C. Difficile by PCR: NEGATIVE

## 2023-01-11 LAB — CLOSTRIDIUM DIFFICILE EIA: C difficile Toxins A+B, EIA: NEGATIVE

## 2023-01-11 NOTE — Therapy (Signed)
OUTPATIENT PHYSICAL THERAPY THORACOLUMBAR TREATMENT Progress Note Reporting Period 11/09/22 to 01/16/23  See note below for Objective Data and Assessment of Progress/Goals.      Patient Name: Rebecca Rocha MRN: [id number] DOB:2000-06-08, 22 y.o., female Today's Date: 01/16/2023  END OF SESSION:  PT End of Session - 01/16/23 1634     Visit Number 10    Date for PT Re-Evaluation 02/20/23    PT Start Time 1634    PT Stop Time 1715    PT Time Calculation (min) 41 min    Activity Tolerance Patient tolerated treatment well    Behavior During Therapy [facility] for tasks assessed/performed               Past Medical History:  Diagnosis Date   Acute calculous cholecystitis 11/26/2019   Allergy    Anxiety    Asthma    Chronic headache disorder    Chronic upper back pain    Depression    Menstrual migraine    Panic attack    Sciatica    Sinusitis    Sleep apnea    Sore of lip 11/24/2021   Syncope and collapse    Vertigo of central origin, unspecified ear    Past Surgical History:  Procedure Laterality Date   CHOLECYSTECTOMY N/A 11/26/2019   Procedure: LAPAROSCOPIC CHOLECYSTECTOMY;  Surgeon: Rodman Pickle, MD;  Location: [facility];  Service: General;  Laterality: N/A;   Patient Active Problem List   Diagnosis Date Noted   Chronic gastritis 10/31/2022   History of self mutilation 09/19/2021   Genetic testing 08/25/2021   Nausea without vomiting 07/07/2021   Generalized abdominal pain 07/07/2021   Chronic myofascial pain 04/15/2021   Breast mass, right 10/08/2020   Depression, recurrent (HCC) 08/20/2020   Insomnia 04/07/2020   Hepatic lesion 04/07/2020   S/P laparoscopic cholecystectomy 12/08/2019   Migraine 12/08/2019   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   Chronic right-sided low back pain 09/18/2019   Allergy to environmental factors 10/27/2016   Asthma 10/27/2016    PCP: Alysia Penna  REFERRING PROVIDER: Alysia Penna   REFERRING DIAG:  [phone]  (ICD-10-CM) - Chronic right-sided low back pain with right-sided sciatica    Rationale for Evaluation and Treatment: Rehabilitation  THERAPY DIAG:  Radiculopathy, lumbar region  Bilateral low back pain with right-sided sciatica, unspecified chronicity  Muscle weakness (generalized)  Other low back pain  ONSET DATE: 10/31/22   SUBJECTIVE:  SUBJECTIVE STATEMENT: "No too good today"  PERTINENT HISTORY:  Cholecystectomy 2021  PAIN:  Are you having pain? Yes: NPRS scale: 10/10 Pain location: R back, hip, and knee Pain description: pinching pain, sharp, shooting  Aggravating factors: sitting too long, walking too far  Relieving factors: exercises (3x a week, weights, walking, stretching)   PRECAUTIONS: None  WEIGHT BEARING RESTRICTIONS: No  FALLS:  Has patient fallen in last 6 months? 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  Extension WNL with pain WFL P!  Right lateral flexion Fib head with pain WFL   Left lateral flexion Fib head with pain WFL tiny pinch  Right rotation 75% with pain WFL  moderate P!  Left rotation 75% with pain WFL   (Blank rows = not tested)  LOWER EXTREMITY ROM:   grossly WFL   LOWER EXTREMITY MMT:  grossly 5/5 BLE   LUMBAR SPECIAL TESTS:  Straight leg raise test: Positive and FABER test: Positive   TODAY'S TREATMENT:                                                                                                                              DATE: 01/16/23 NuStep L5 x73mins  Recheck goals for progress note  Passive stretching to hamstrings, single K2C, ITB, Glute, lower trunk rotations, double K2C, Double K2C with rotation  Shoulder ext 10# 2x10 AR press 10# 2x10   12/27/22 NuStep L 5 x 6 min Passive stretching to hamstrings, single K2C, ITB, Glute, lower trunk rotations, double K2C, Double K2C with rotation.  12/21/22 NuStep L5 x Bike L 3 x 4 min  S2S OHP yellow ball 2x10 6in box on airex step ups x10 4in box on airex lateral step ups x10 Shoulder Ext blue 2x10 Standing Tband row blue 2x10  12/19/22 NuStep L4 x 6 min Bike L 2 x 6 min Rows & Lats 15lb 2x10 Leg press 20lb 2x12 HS curls 20lb 2x10 Leg Ext 5lb 2x10 Shoulder Ext 10lb 2x10 Stretching HS, piriformis, DKTC, SKTC, glutes, trunk rotations   12/05/22 NuStep L4 x 6 min Checked GOALS Shoulder Ext 10lb 2x10 AR press 15lb x0 each HS curls 25lb 2x15 Leg Ext 10lb 2x12 Leg Ext 5lb 2x10 Stretching HS, piriformis, DKTC, SKTC, glutes, trunk rotations   11/30/22 NuStep L5 x59mins  Calf stretch 30s  Stretching HS,  piriformis, DKTC, SKTC, glutes, trunk rotations  STS with OHP 2x10 Leg ext 10# 2x10 HS curls 20# 2x10 Traction to lumbar spine 80# 10 mins   11/27/22 Bike L2 x Lats and rows 20lb 2x10 Leg press 40lb 2x10 Cable trunk rotation 20lb B 2x10 Standing hip ext R 10lb 2x10 Bridges/K2C/obliques with orange ball 2x10 PROM/stretching to R LE   11/23/22 NuStep L4 x 6  HS 20lb 2x10  Leg Ext 5lb 2x10 Rows & Lats 20lb 2x10  PROM to LE with end range holds   11/14/22 NuStep L 3 x 6 min S2S 2x10  Shoulder Ext green 2x12 Seated Rows green 2x12 Bridges x10 LE on  Pball bridges, Oblq, K2C PROM to LE with end range holds Light manual traction 3x15''  EVAL- 11/09/22    PATIENT EDUCATION:  Education details: POC and HEP Person educated: Patient Education method: Explanation Education comprehension: verbalized understanding  HOME EXERCISE PROGRAM: Access Code: [email or web address] URL: [email or web address] Date: 11/09/2022 Prepared by: Cassie Freer  Exercises - Supine Lower Trunk Rotation  - 1 x daily - 7 x weekly - 2 sets - 10 reps - Supine Bridge  - 1 x daily - 7 x weekly - 2 sets - 10 reps - Clamshell with Resistance  - 1 x daily - 7 x weekly - 2 sets - 10 reps - Supine Piriformis Stretch  - 1 x daily - 7 x weekly - 2 sets - 30 hold  ASSESSMENT:  CLINICAL IMPRESSION: Patient is a 22 y.o. female who was seen today for physical therapy treatment for LBP.  She reports a recent MD appointment and was given prednisone but has had no relief.  She continues to have high pain levels and wanted me to send a message to her PCP as she feels her doctor is not taking her concerns seriously. She reports that's stretching helpes the most. Session focused mostly on stretching. Will put her on a 2 week hold until she can figure out what else is going on and let us know if she wants to return and keep trying with PT.   OBJECTIVE IMPAIRMENTS: difficulty walking and pain.   ACTIVITY LIMITATIONS: stairs and locomotion level  REHAB POTENTIAL: Good  CLINICAL DECISION MAKING: Stable/uncomplicated  EVALUATION COMPLEXITY: Low  GOALS: Goals reviewed with patient? Yes  SHORT TERM GOALS: Target date: 12/14/22  Patient will be independent with initial HEP.  Goal status: Met 12/05/22  2.  Patient will report centralization of radicular symptoms.  Baseline: radiates into RLE Goal status: improved 50%    LONG TERM GOALS: Target date: 02/20/23  Patient will be independent with advanced/ongoing HEP to improve outcomes and carryover.  Goal status: On  going  2.  Patient will report 75% improvement in low back pain to improve QOL.  Baseline: 9.5/10 pain Goal status: IN PROGRESS 35% 01/16/23  3.  Patient will demonstrate full pain free lumbar ROM to perform ADLs.   Baseline: pain with movements, see chart Goal status: Has met PROM porting of goal, still has some pain Ongoing- pain with ext, flexion and L flexion and rotation 01/16/23  4.  Patient will tolerate 1 hour of (standing/sitting/walking) to perform ADLs without pain. Baseline: pain with prolonged sitting or walking  Goal status: On going 01/16/23   PLAN:  PT FREQUENCY: 1-2x/week  PT DURATION: 10 weeks  PLANNED INTERVENTIONS: Therapeutic exercises, Therapeutic activity, Neuromuscular re-education, Balance training, Gait training, Patient/Family education, Self Care, Joint mobilization, Joint manipulation, Dry Needling, Electrical stimulation, Spinal manipulation, Spinal  mobilization, Cryotherapy, Moist heat, Traction, Ionotophoresis 4mg /ml Dexamethasone, and Manual therapy.  PLAN FOR NEXT SESSION: Get report from MD   Cassie Freer, PT, DPT 01/16/2023, 5:11 PM

## 2023-01-16 ENCOUNTER — Ambulatory Visit: Payer: 59 | Attending: Nurse Practitioner

## 2023-01-16 DIAGNOSIS — M6281 Muscle weakness (generalized): Secondary | ICD-10-CM | POA: Insufficient documentation

## 2023-01-16 DIAGNOSIS — M5459 Other low back pain: Secondary | ICD-10-CM | POA: Diagnosis present

## 2023-01-16 DIAGNOSIS — M5441 Lumbago with sciatica, right side: Secondary | ICD-10-CM | POA: Diagnosis present

## 2023-01-16 DIAGNOSIS — M5416 Radiculopathy, lumbar region: Secondary | ICD-10-CM | POA: Insufficient documentation

## 2023-01-17 ENCOUNTER — Telehealth: Payer: Self-pay | Admitting: Nurse Practitioner

## 2023-01-17 DIAGNOSIS — G8929 Other chronic pain: Secondary | ICD-10-CM

## 2023-01-17 NOTE — Telephone Encounter (Signed)
-----   Message from [facility] sent at 01/16/2023  4:38 PM EDT ----- Regarding: follow up Hi,  I am seeing a patient that was referred here by you. Her name is Rebecca Rocha, MRN is [id number]. She wanted me to send a message to express that she has been doing PT since June 27th but nothing has been helping her pain. Today is her 10th visit with [place]. Her pain levels are still pretty high at a constant 8-10/10 and she has continues to have sciatic symptoms. She reports that she has been walking, running, and working on her range of motion every day. She is not lifting any weights. Is it possible to refer her for imaging or to a specialist, maybe a neuro or spine doctor?   Thanks, Cassie Freer, DPT

## 2023-01-19 LAB — PANCREATIC ELASTASE, FECAL: Pancreatic Elastase-1, Stool: >500 ug/g

## 2023-01-22 ENCOUNTER — Other Ambulatory Visit: Payer: Self-pay | Admitting: *Deleted

## 2023-01-22 DIAGNOSIS — R197 Diarrhea, unspecified: Secondary | ICD-10-CM

## 2023-01-24 ENCOUNTER — Telehealth: Payer: Self-pay | Admitting: Nurse Practitioner

## 2023-01-24 NOTE — Telephone Encounter (Signed)
Per ref coord, auth approved for MRI. I notified pt and she will call [facility] to reschedule. Pt appreciative of f/u call.

## 2023-01-24 NOTE — Telephone Encounter (Addendum)
Pt called in with concerns about her MRI being cancelled. Pt states that her ins company called the office this morning and was told it was cancelled because there was not a location. I advised pt I did not understand why that was advised and there was not documentation of the call. I was able to see appt that was scheduled with [facility] and that it was cancelled. Advised pt that auth showing to me as pending. Pt very upset that she has waited so long, had PT, etc and now appt was cancelled. Advised pt I will get further info and call her back.

## 2023-01-25 ENCOUNTER — Other Ambulatory Visit: Payer: 59

## 2023-01-29 ENCOUNTER — Ambulatory Visit
Admission: RE | Admit: 2023-01-29 | Discharge: 2023-01-29 | Disposition: A | Discharge status: Home / Self Care | Payer: 59 | Source: Ambulatory Visit | Attending: Gastroenterology

## 2023-01-29 DIAGNOSIS — R197 Diarrhea, unspecified: Secondary | ICD-10-CM

## 2023-01-30 ENCOUNTER — Ambulatory Visit
Admission: RE | Admit: 2023-01-30 | Discharge: 2023-01-30 | Disposition: A | Discharge status: Home / Self Care | Payer: 59 | Source: Ambulatory Visit | Attending: Nurse Practitioner

## 2023-01-30 DIAGNOSIS — G8929 Other chronic pain: Secondary | ICD-10-CM

## 2023-02-07 ENCOUNTER — Ambulatory Visit: Payer: 59 | Admitting: Obstetrics and Gynecology

## 2023-02-12 ENCOUNTER — Other Ambulatory Visit: Payer: Self-pay | Admitting: *Deleted

## 2023-02-12 ENCOUNTER — Other Ambulatory Visit: Payer: Self-pay | Admitting: Gastroenterology

## 2023-02-12 ENCOUNTER — Other Ambulatory Visit: Payer: Self-pay | Admitting: Nurse Practitioner

## 2023-02-12 ENCOUNTER — Telehealth: Payer: Self-pay | Admitting: Nurse Practitioner

## 2023-02-12 DIAGNOSIS — G8929 Other chronic pain: Secondary | ICD-10-CM

## 2023-02-12 MED ORDER — COLESTIPOL HCL 1 G PO TABS: 2.0000 g | ORAL_TABLET | Freq: Every day | ORAL | 1 refills | Status: DC

## 2023-02-12 MED ORDER — AMITRIPTYLINE HCL 10 MG PO TABS
10.0000 mg | ORAL_TABLET | Freq: Every day | ORAL | 5 refills | Status: DC
Start: 2023-02-12 — End: 2023-02-16

## 2023-02-12 NOTE — Assessment & Plan Note (Signed)
Rebecca Rocha reports outpatient PT, flexeril, gabapentin, and tylenol were ineffective in pain management. She wants to know if MRI of thoracic and cervical spine need to be completed.  Sent elavil 10mg  Referred to pain clinic

## 2023-02-12 NOTE — Telephone Encounter (Signed)
Pt called stating she was unhappy about the diagnosis to continue with the gabapentin and Tylenol. She stated that she told that it was not working and felt the MRI should be repeated. She stated if her concerns could not be met she would choose a different provider.  While I was getting clarification as to what she wanted in the note, in a loud voice she said never mind I will just call the other office to get help and hung up.  I was confused as to why she was so upset.

## 2023-02-12 NOTE — Telephone Encounter (Signed)
LVM for patient to return call. 

## 2023-02-12 NOTE — Telephone Encounter (Signed)
Rebecca Rocha reports outpatient PT, flexeril, gabapentin, and tylenol were ineffective in pain management. She wants to know if MRI of thoracic and cervical spine need to be completed. I explained to Rebecca Rocha hat her symptoms point to possible lumbar spine radiculopathy, hence only MRI lumbar spine is appropriate at this time. I recommended switching gabapentin to elavil and referral to pain clinic. She agreed with above recommendation. She also agreed to schedule a 55month f/up appointment with me. 03/14/23 at 1pm

## 2023-02-12 NOTE — Telephone Encounter (Addendum)
Patient notified of below message and said that she is not taking Gabapentin and it does not work for her and she is requesting an MRI of her spine.  Patient said that she feels that you are not listening to her recommendations and if you are not listening to her she is going to have to switch doctors-per patient

## 2023-02-12 NOTE — Assessment & Plan Note (Signed)
Ms. Jayme Cloud reports outpatient PT, flexeril, gabapentin, and tylenol were ineffective in pain management.  Unable to afford lyrica (no insurance coverage) Sent elavil 10mg  at hs F/up in 53month

## 2023-02-13 DIAGNOSIS — A749 Chlamydial infection, unspecified: Secondary | ICD-10-CM

## 2023-02-13 HISTORY — DX: Chlamydial infection, unspecified: A74.9

## 2023-02-16 ENCOUNTER — Other Ambulatory Visit: Payer: Self-pay | Admitting: Nurse Practitioner

## 2023-02-16 ENCOUNTER — Ambulatory Visit (INDEPENDENT_AMBULATORY_CARE_PROVIDER_SITE_OTHER): Payer: 59 | Admitting: Obstetrics and Gynecology

## 2023-02-16 ENCOUNTER — Encounter: Payer: Self-pay | Admitting: Obstetrics and Gynecology

## 2023-02-16 VITALS — BP 112/70 | HR 59 | Ht 61.0 in | Wt 135.0 lb

## 2023-02-16 DIAGNOSIS — N898 Other specified noninflammatory disorders of vagina: Secondary | ICD-10-CM

## 2023-02-16 DIAGNOSIS — B3731 Acute candidiasis of vulva and vagina: Secondary | ICD-10-CM

## 2023-02-16 DIAGNOSIS — N926 Irregular menstruation, unspecified: Secondary | ICD-10-CM

## 2023-02-16 DIAGNOSIS — M7918 Myalgia, other site: Secondary | ICD-10-CM

## 2023-02-16 LAB — PREGNANCY, URINE: Preg Test, Ur: NEGATIVE

## 2023-02-16 MED ORDER — FLUCONAZOLE 150 MG PO TABS: 150.0000 mg | ORAL_TABLET | Freq: Once | ORAL | 1 refills | Status: AC

## 2023-02-16 MED ORDER — DULOXETINE HCL 20 MG PO CPEP
20.0000 mg | ORAL_CAPSULE | Freq: Every day | ORAL | 5 refills | Status: DC
Start: 2023-02-16 — End: 2023-03-14

## 2023-02-16 MED ORDER — NORETHINDRONE ACETATE 5 MG PO TABS: 10.0000 mg | ORAL_TABLET | Freq: Every day | ORAL | 0 refills | Status: DC

## 2023-02-16 NOTE — Progress Notes (Signed)
Acute Office Visit  Subjective:    Patient ID: Rebecca Rocha, female    DOB: 2001-01-12, 22 y.o.   MRN: [id number]   HPI 22 y.o. presents today for GYN Problem (Pt reports ~2 months ago, she started having a week of bleeding (menses), then would having nothing for 2-3 days and then the the cycle would start back and this has happened ~4x since visit today. /Pt denies significant heaviness or pain but did mention with 2/4x of bleeding, noticed some clotting. /Pt reports not preventing on trying to conceive at this point. Pt reports taking a recent pregnancy test at home ~1.5 weeks ago and results were neg. ) . She has been on nexplanon and depo in the past and did well on these with no periods.  She has been off and would like to conceive.  She is married and both have never been pregnant. Cannot try to conceive with irregular cycles that started a couple months ago.  Patient's last menstrual period was 01/19/2023.    Review of Systems     Objective:    Physical Exam Constitutional:      Appearance: Normal appearance.  Genitourinary:     Right Labia: No rash.    Left Labia: No rash.    Vaginal discharge (yeast) present.     No vaginal prolapse present.    No vaginal atrophy present.    No cervical motion tenderness or lesion.  Musculoskeletal:     Cervical back: Normal range of motion.  Neurological:     Mental Status: She is alert and oriented to person, place, and time.  Psychiatric:        Mood and Affect: Mood normal.        Behavior: Behavior normal.  Vitals and nursing note reviewed.     BP 112/70   Pulse (!) 59   Ht 5\' 1"  (1.549 m)   Wt 135 lb (61.2 kg)   LMP 01/19/2023   SpO2 97%   BMI 25.51 kg/m  Wt Readings from Last 3 Encounters:  02/16/23 135 lb (61.2 kg)  01/05/23 143 lb 2 oz (64.9 kg)  12/22/22 144 lb 6.4 oz (65.5 kg)        Patient informed chaperone available to be present for breast and/or pelvic exam. Patient has requested no  chaperone to be present. Patient has been advised what will be completed during breast and pelvic exam.   Assessment & Plan:  Irregular bleeding -     Pregnancy, urine -     Hemoglobin A1c -     TSH -     CBC -     US PELVIC COMPLETE WITH TRANSVAGINAL; Future  Vaginal itching -     Hemoglobin A1c -     SureSwab Advanced Vaginitis Plus,TMA  Other orders -     Norethindrone Acetate; Take 2 tablets (10 mg total) by mouth daily for 10 days.  Dispense: 20 tablet; Refill: 0    Diflucan sent in for likely yeast infection on exam today  Earley Favor

## 2023-02-16 NOTE — Telephone Encounter (Signed)
Patient notified

## 2023-02-16 NOTE — Telephone Encounter (Signed)
Pt came into office requesting copy of her medication records, including imaging results. Advised pt much of this is available through MyChart and pt has completed medical records release form for full chart information.   Pt said she wanted to complain on provider. Pt stated same as below: does not feel provider is listening to her regarding her pain and that pain stems from neck to lower back with radiating pain in arms & sciatica. Pt was disappointed that amitriptyline was sent in for her on 02/12/2023. Her mom picked up for her so she didn't know it was ordered until she came home. She states she was on this medication previously and discontinued because it makes her too sleepy and not able to function at work. Pt requesting alternative medication for pain while waiting for pain management referral/scheduling. Advised pt that referral is in review with [facility] and generally takes 2 weeks. Also advised I would send request to provider for medication alternative. Pt states Claris Gower is a good doctor but she is not feeling heard regarding pain. Pt said she has taken prednisone before w/o help. Pt also said she does not want narcotic medications.

## 2023-02-16 NOTE — Assessment & Plan Note (Signed)
Declined to take elavil 10mg  due to risk of daytime somnolence. Sent cymbalta 20mg  every day F/up in 65month

## 2023-02-17 LAB — HEMOGLOBIN A1C
Hgb A1c MFr Bld: 4.7 %{Hb} (ref <5.7)
Mean Plasma Glucose: 88 mg/dL
eAG (mmol/L): 4.9 mmol/L

## 2023-02-17 LAB — CBC
HCT: 39.3 % (ref 35.0–45.0)
Hemoglobin: 12.6 g/dL (ref 11.7–15.5)
MCH: 30 pg (ref 27.0–33.0)
MCHC: 32.1 g/dL (ref 32.0–36.0)
MCV: 93.6 fL (ref 80.0–100.0)
MPV: 11.1 fL (ref 7.5–12.5)
Platelets: 389 10*3/uL (ref 140–400)
RBC: 4.2 10*6/uL (ref 3.80–5.10)
RDW: 10.8 % — ABNORMAL LOW (ref 11.0–15.0)
WBC: 9.2 10*3/uL (ref 3.8–10.8)

## 2023-02-17 LAB — SURESWAB® ADVANCED VAGINITIS PLUS,TMA
C. trachomatis RNA, TMA: DETECTED — AB
CANDIDA SPECIES: DETECTED — AB
Candida glabrata: NOT DETECTED
N. gonorrhoeae RNA, TMA: NOT DETECTED
SURESWAB(R) ADV BACTERIAL VAGINOSIS(BV),TMA: NEGATIVE
TRICHOMONAS VAGINALIS (TV),TMA: NOT DETECTED

## 2023-02-17 LAB — TSH: TSH: 0.55 m[IU]/L

## 2023-02-19 ENCOUNTER — Encounter: Payer: Self-pay | Admitting: Obstetrics and Gynecology

## 2023-02-19 ENCOUNTER — Other Ambulatory Visit: Payer: Self-pay | Admitting: *Deleted

## 2023-02-19 MED ORDER — FLUCONAZOLE 150 MG PO TABS: 150.0000 mg | ORAL_TABLET | Freq: Once | ORAL | 0 refills | Status: AC

## 2023-02-19 MED ORDER — METRONIDAZOLE 500 MG PO TABS: 2000.0000 mg | ORAL_TABLET | Freq: Once | ORAL | 0 refills | Status: AC

## 2023-02-19 NOTE — Telephone Encounter (Signed)
See result encounter.   Encounter closed.

## 2023-02-26 ENCOUNTER — Ambulatory Visit (HOSPITAL_COMMUNITY)
Admission: RE | Admit: 2023-02-26 | Discharge: 2023-02-26 | Disposition: A | Discharge status: Home / Self Care | Payer: 59 | Source: Ambulatory Visit | Attending: Obstetrics and Gynecology | Admitting: Obstetrics and Gynecology

## 2023-02-26 ENCOUNTER — Ambulatory Visit (HOSPITAL_COMMUNITY): Payer: 59

## 2023-02-26 DIAGNOSIS — N926 Irregular menstruation, unspecified: Secondary | ICD-10-CM | POA: Insufficient documentation

## 2023-02-27 ENCOUNTER — Telehealth: Payer: Self-pay | Admitting: *Deleted

## 2023-02-27 ENCOUNTER — Encounter: Payer: Self-pay | Admitting: Physical Medicine and Rehabilitation

## 2023-02-27 NOTE — Telephone Encounter (Signed)
Dr. Karma Greaser -please review 02/16/23 Lanny Hurst results. Testing positive for Chlamydia. Please advise.

## 2023-02-28 ENCOUNTER — Encounter: Payer: Self-pay | Admitting: Obstetrics and Gynecology

## 2023-02-28 MED ORDER — AZITHROMYCIN 250 MG PO TABS: 1000.0000 mg | ORAL_TABLET | Freq: Once | ORAL | 0 refills | Status: AC

## 2023-02-28 NOTE — Telephone Encounter (Signed)
Spoke with patient. Advised per Dr. Karma Greaser.  Rx to verified pharmacy. TOC already scheduled for 03/19/23 at 1600.   Questions answered. Patient verbalizes understanding and is agreeable.   [facility] Communicable Disease Report completed electronically.   Encounter closed.

## 2023-03-09 NOTE — Progress Notes (Deleted)
Subjective:    Patient ID: Rebecca Rocha, female    DOB: [birth year], 22 y.o.   MRN: [id number]  HPI   Pain Inventory Average Pain {NUMBERS; 0-10:5044} Pain Right Now {NUMBERS; 0-10:5044} My pain is {PAIN DESCRIPTION:21022940}  In the last 24 hours, has pain interfered with the following? General activity {NUMBERS; 0-10:5044} Relation with others {NUMBERS; 0-10:5044} Enjoyment of life {NUMBERS; 0-10:5044} What TIME of day is your pain at its worst? {time of day:24191} Sleep (in general) {BHH GOOD/FAIR/POOR:22877}  Pain is worse with: {ACTIVITIES:21022942} Pain improves with: {PAIN IMPROVES ZHYQ:65784696} Relief from Meds: {NUMBERS; 0-10:5044}  {MOBILITY EXB:28413244}  {FUNCTION:21022946}  {NEURO/PSYCH:21022948}  {CPRM PRIOR STUDIES:21022953}  {CPRM PHYSICIANS INVOLVED IN YOUR CARE:21022954}    Family History  Problem Relation Age of Onset   Hypertension Mother    Liver disease Mother    Anxiety disorder Mother    Kidney disease Mother    Other Mother        Negative hereditary cancer genetic testing   Hypertension Father    Diabetes Father    Anxiety disorder Father    Anxiety disorder Sister    Depression Sister    Breast cancer Maternal Grandmother 73   Diabetes Paternal Grandmother    Hypertension Paternal Grandmother    High Cholesterol Paternal Grandmother    Cancer Paternal Grandfather    Diabetes Paternal Grandfather    Hypertension Paternal Grandfather    High Cholesterol Paternal Grandfather    Other Cousin        maternal second cousin, BRCA2+   Esophageal cancer Neg Hx    Rectal cancer Neg Hx    Stomach cancer Neg Hx    Social History   Socioeconomic History   Marital status: Married    Spouse name: Not on file   Number of children: 0   Years of education: Not on file   Highest education level: Associate degree: occupational, Scientist, product/process development, or vocational program  Occupational History   Not on file  Tobacco Use   Smoking  status: Never   Smokeless tobacco: Never  Vaping Use   Vaping status: Never Used  Substance and Sexual Activity   Alcohol use: Yes    Alcohol/week: 2.0 standard drinks of alcohol    Types: 1 Cans of beer, 1 Shots of liquor per week    Comment: occasionally   Drug use: No   Sexual activity: Yes    Partners: Male    Birth control/protection: Injection    Comment: 1st intercourse- 15, partners- 5  Other Topics Concern   Not on file  Social History Narrative   Consulting civil engineer at [employer]   Financial Resource Strain: Low Risk  (12/22/2022)   Overall Financial Resource Strain (CARDIA)    Difficulty of Paying Living Expenses: Not hard at all  Food Insecurity: Food Insecurity Present (12/22/2022)   Hunger Vital Sign    Worried About Running Out of Food in the Last Year: Sometimes true    Ran Out of Food in the Last Year: Never true  Transportation Needs: No Transportation Needs (12/22/2022)   PRAPARE - Administrator, Civil Service (Medical): No    Lack of Transportation (Non-Medical): No  Physical Activity: Sufficiently Active (12/22/2022)   Exercise Vital Sign    Days of Exercise per Week: 6 days    Minutes of Exercise per Session: 140 min  Stress: Stress Concern Present (12/22/2022)   [employer] of Occupational Health - Occupational Stress Questionnaire  Feeling of Stress : Very much  Social Connections: Unknown (12/22/2022)   Social Connection and Isolation Panel [NHANES]    Frequency of Communication with Friends and Family: More than three times a week    Frequency of Social Gatherings with Friends and Family: More than three times a week    Attends Religious Services: Patient declined    Active Member of Clubs or Organizations: No    Attends Engineer, structural: Not on file    Marital Status: Married   Past Surgical History:  Procedure Laterality Date   CHOLECYSTECTOMY N/A 11/26/2019   Procedure: LAPAROSCOPIC CHOLECYSTECTOMY;   Surgeon: Kinsinger, De Blanch, MD;  Location: [facility];  Service: General;  Laterality: N/A;   Past Medical History:  Diagnosis Date   Acute calculous cholecystitis 11/26/2019   Allergy    Anxiety    Asthma    Chlamydia 02/2023   treatment sent.  needs TOC   Chronic headache disorder    Chronic upper back pain    Depression    Menstrual migraine    Panic attack    Sciatica    Sinusitis    Sleep apnea    Sore of lip 11/24/2021   Syncope and collapse    Vertigo of central origin, unspecified ear    There were no vitals taken for this visit.  Opioid Risk Score:   Fall Risk Score:  `1  Depression screen [facility] 2/9     10/31/2022   11:22 AM 07/20/2022    2:12 PM 07/20/2022    1:57 PM 10/17/2021    9:37 AM 09/19/2021    9:20 AM 07/07/2021    1:31 PM 05/31/2021    1:48 PM  Depression screen PHQ 2/9  Decreased Interest  1 1 1 3 1 3   Down, Depressed, Hopeless 1 1 0 1 3 2 1   PHQ - 2 Score 1 2 1 2 6 3 4   Altered sleeping 3 1 3 3 3 1 3   Tired, decreased energy 3 1 2 3 3 1 3   Change in appetite 3 3 0 1 3 2 3   Feeling bad or failure about yourself  0 0 0 0 3 0 0  Trouble concentrating 0 0 3 0 3 0 3  Moving slowly or fidgety/restless 0 0 0 0 1 0 0  Suicidal thoughts 0 0 0 0 2 0 0  PHQ-9 Score 10 7 9 9 24 7 16   Difficult doing work/chores Very difficult Somewhat difficult Very difficult Somewhat difficult Extremely dIfficult Somewhat difficult Very difficult    Review of Systems     Objective:   Physical Exam        Assessment & Plan:

## 2023-03-12 ENCOUNTER — Encounter: Payer: 59 | Attending: Physical Medicine and Rehabilitation | Admitting: Physical Medicine and Rehabilitation

## 2023-03-14 ENCOUNTER — Encounter: Payer: Self-pay | Admitting: Nurse Practitioner

## 2023-03-14 ENCOUNTER — Ambulatory Visit (INDEPENDENT_AMBULATORY_CARE_PROVIDER_SITE_OTHER): Payer: 59 | Admitting: Nurse Practitioner

## 2023-03-14 VITALS — BP 110/60 | HR 67 | Temp 98.2°F | Resp 18 | Wt 132.2 lb

## 2023-03-14 DIAGNOSIS — G8929 Other chronic pain: Secondary | ICD-10-CM | POA: Diagnosis not present

## 2023-03-14 DIAGNOSIS — M5441 Lumbago with sciatica, right side: Secondary | ICD-10-CM

## 2023-03-14 DIAGNOSIS — J4520 Mild intermittent asthma, uncomplicated: Secondary | ICD-10-CM

## 2023-03-14 DIAGNOSIS — M7918 Myalgia, other site: Secondary | ICD-10-CM | POA: Diagnosis not present

## 2023-03-14 MED ORDER — ALBUTEROL SULFATE HFA 108 (90 BASE) MCG/ACT IN AERS
2.0000 | INHALATION_SPRAY | Freq: Four times a day (QID) | RESPIRATORY_TRACT | 0 refills | Status: DC | PRN
Start: 2023-03-14 — End: 2023-04-10

## 2023-03-14 NOTE — Patient Instructions (Signed)
Schedule appointment with pain clinic as discussed

## 2023-03-14 NOTE — Assessment & Plan Note (Signed)
Declined to use Cymbalta as well due to possible side effects. Did not maintain appointment with pain clinic and declined to reschedule appointment.  She opted to find another pain clinic within her insurance network.

## 2023-03-14 NOTE — Progress Notes (Signed)
Established Patient Visit  Patient: Rebecca Rocha   DOB: 2000/06/27   22 y.o. Female  MRN: [id number] Visit Date: 03/14/2023  Subjective:    Chief Complaint  Patient presents with   office visit     PT is here for back pain management. PT voiced she missed pain management appointment    HPI Asthma Stable with use of albuterol prn Refill sent  Chronic myofascial pain Declined to use Cymbalta as well due to possible side effects. Did not maintain appointment with pain clinic and declined to reschedule appointment.  She opted to find another pain clinic within her insurance network.  Chronic right-sided low back pain Declined to use Cymbalta as well due to possible side effects. Did not maintain appointment with pain clinic and declined to reschedule appointment.  She opted to find another pain clinic within her insurance network.  Reviewed medical, surgical, and social history today  Medications: Outpatient Medications Prior to Visit  Medication Sig   azithromycin (ZITHROMAX) 250 MG tablet Take by mouth 2 (two) times daily.   fluconazole (DIFLUCAN) 150 MG tablet Take 150 mg by mouth once.   metroNIDAZOLE (FLAGYL) 500 MG tablet Take by mouth 2 (two) times daily.   norethindrone (AYGESTIN) 5 MG tablet Take 2 tablets (10 mg total) by mouth daily for 10 days.   [DISCONTINUED] albuterol (PROVENTIL) (2.5 MG/3ML) 0.083% nebulizer solution Take 3 mLs (2.5 mg total) by nebulization every 6 (six) hours as needed for wheezing or shortness of breath. (Patient not taking: Reported on 03/14/2023)   [DISCONTINUED] albuterol (VENTOLIN HFA) 108 (90 Base) MCG/ACT inhaler INHALE 1 TO 2 PUFFS BY MOUTH EVERY 6 HOURS AS NEEDED FOR WHEEZING OR SHORTNESS OF BREATH (Patient not taking: Reported on 03/14/2023)   [DISCONTINUED] amitriptyline (ELAVIL) 10 MG tablet Take 10 mg by mouth at bedtime. (Patient not taking: Reported on 03/14/2023)   [DISCONTINUED] colestipol (COLESTID) 1 g  tablet Take 1 g by mouth 2 (two) times daily. (Patient not taking: Reported on 03/14/2023)   [DISCONTINUED] DULoxetine (CYMBALTA) 20 MG capsule Take 1 capsule (20 mg total) by mouth daily. (Patient not taking: Reported on 03/14/2023)   [DISCONTINUED] ondansetron (ZOFRAN-ODT) 4 MG disintegrating tablet Take 1 tablet (4 mg total) by mouth every 8 (eight) hours as needed. (Patient not taking: Reported on 03/14/2023)   No facility-administered medications prior to visit.   Reviewed past medical and social history.   ROS per HPI above      Objective:  BP 110/60 (BP Location: Left Arm, Patient Position: Sitting, Cuff Size: Normal)   Pulse 67   Temp 98.2 F (36.8 C) (Temporal)   Resp 18   Wt 132 lb 3.2 oz (60 kg)   LMP 03/09/2023 (Exact Date)   SpO2 99%   BMI 24.98 kg/m      Physical Exam  No results found for any visits on 03/14/23.    Assessment & Plan:    Problem List Items Addressed This Visit     Asthma    Stable with use of albuterol prn Refill sent      Relevant Medications   albuterol (VENTOLIN HFA) 108 (90 Base) MCG/ACT inhaler   Chronic myofascial pain - Primary    Declined to use Cymbalta as well due to possible side effects. Did not maintain appointment with pain clinic and declined to reschedule appointment.  She opted to find another pain clinic within her insurance network.  Relevant Orders   Ambulatory referral to Pain Clinic   Chronic right-sided low back pain    Declined to use Cymbalta as well due to possible side effects. Did not maintain appointment with pain clinic and declined to reschedule appointment.  She opted to find another pain clinic within her insurance network.      Relevant Orders   Ambulatory referral to Pain Clinic   Return if symptoms worsen or fail to improve.     Alysia Penna, NP

## 2023-03-14 NOTE — Assessment & Plan Note (Addendum)
Stable with use of albuterol prn Refill sent

## 2023-03-19 ENCOUNTER — Ambulatory Visit (INDEPENDENT_AMBULATORY_CARE_PROVIDER_SITE_OTHER): Payer: 59 | Admitting: Obstetrics and Gynecology

## 2023-03-19 ENCOUNTER — Encounter: Payer: Self-pay | Admitting: Obstetrics and Gynecology

## 2023-03-19 VITALS — BP 110/70 | HR 101

## 2023-03-19 DIAGNOSIS — Z8619 Personal history of other infectious and parasitic diseases: Secondary | ICD-10-CM | POA: Diagnosis not present

## 2023-03-19 DIAGNOSIS — Z113 Encounter for screening for infections with a predominantly sexual mode of transmission: Secondary | ICD-10-CM
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
## 2023-03-21 LAB — SURESWAB® ADVANCED VAGINITIS PLUS,TMA
C. trachomatis RNA, TMA: DETECTED — AB
CANDIDA SPECIES: NOT DETECTED
Candida glabrata: NOT DETECTED
N. gonorrhoeae RNA, TMA: NOT DETECTED
SURESWAB(R) ADV BACTERIAL VAGINOSIS(BV),TMA: NEGATIVE
TRICHOMONAS VAGINALIS (TV),TMA: NOT DETECTED

## 2023-03-22 ENCOUNTER — Other Ambulatory Visit: Payer: Self-pay

## 2023-03-22 DIAGNOSIS — Z8619 Personal history of other infectious and parasitic diseases: Secondary | ICD-10-CM

## 2023-03-22 MED ORDER — AZITHROMYCIN 1 G PO PACK
1.0000 | PACK | Freq: Once | ORAL | 0 refills | Status: AC
Start: 2023-03-22 — End: 2023-03-22

## 2023-04-05 ENCOUNTER — Ambulatory Visit: Payer: 59 | Admitting: Obstetrics and Gynecology

## 2023-04-08 ENCOUNTER — Other Ambulatory Visit: Payer: Self-pay | Admitting: Nurse Practitioner

## 2023-04-08 DIAGNOSIS — J4520 Mild intermittent asthma, uncomplicated: Secondary | ICD-10-CM

## 2023-04-10 ENCOUNTER — Other Ambulatory Visit: Payer: Self-pay | Admitting: Nurse Practitioner

## 2023-04-10 DIAGNOSIS — J4520 Mild intermittent asthma, uncomplicated: Secondary | ICD-10-CM

## 2023-04-10 MED ORDER — ALBUTEROL SULFATE HFA 108 (90 BASE) MCG/ACT IN AERS
2.0000 | INHALATION_SPRAY | Freq: Four times a day (QID) | RESPIRATORY_TRACT | 0 refills | Status: DC | PRN
Start: 2023-04-10 — End: 2023-08-08

## 2023-04-10 NOTE — Addendum Note (Signed)
Addended by: Michaela Corner on: 04/10/2023 02:33 PM   Modules accepted: Orders

## 2023-04-19 ENCOUNTER — Ambulatory Visit: Payer: 59 | Admitting: Obstetrics and Gynecology

## 2023-05-08 ENCOUNTER — Other Ambulatory Visit: Payer: Self-pay | Admitting: Nurse Practitioner

## 2023-05-08 DIAGNOSIS — J4520 Mild intermittent asthma, uncomplicated: Secondary | ICD-10-CM

## 2023-05-22 ENCOUNTER — Encounter: Payer: Self-pay | Admitting: Obstetrics and Gynecology

## 2023-06-04 ENCOUNTER — Telehealth: Payer: Self-pay | Admitting: Obstetrics and Gynecology

## 2023-06-04 ENCOUNTER — Encounter: Payer: Self-pay | Admitting: Obstetrics and Gynecology

## 2023-06-04 NOTE — Telephone Encounter (Signed)
Spoke with patient in December trying to rescheduled her missed test of cure appointment she stated she would call back after the holidays to schedule. Letter mailed on January 7 for patient to call and reschedule; patient has not called back.

## 2023-06-13 NOTE — Telephone Encounter (Signed)
Left message to call Noreene Larsson, RN at [facility], [phone], option 5.

## 2023-06-28 NOTE — Telephone Encounter (Signed)
From: Lillia Carmel, CMA  Sent: 06/13/2023  11:16 AM EST  To: [facility] Triage   Patient returned our call, we called about patient needing to follow up and schedule TOC appt. Patient stated she wanted to see a different provider in our office but didn't know who and would call back later this day to schedule   From: Lillia Carmel, CMA  Sent: 06/27/2023   4:39 PM EST  To: Selinda Eon, CMA   Just informing provider patient cancelled the last one and then no showed, it looks the office wanted her to come in for the fu but she is not wanting to schedule it now     Routing to Dr. Karma Greaser, please advise if any additional f/u is needed.

## 2023-07-24 ENCOUNTER — Encounter: Payer: 59 | Admitting: Nurse Practitioner

## 2023-08-07 ENCOUNTER — Telehealth: Payer: Self-pay | Admitting: Nurse Practitioner

## 2023-08-07 NOTE — Telephone Encounter (Signed)
 07/24/2023 same day cancellation, 1st missed visit since 12/2021, letter sent via mychart, pt has rescheduled

## 2023-08-08 ENCOUNTER — Encounter: Payer: Self-pay | Admitting: Nurse Practitioner

## 2023-08-08 ENCOUNTER — Ambulatory Visit (INDEPENDENT_AMBULATORY_CARE_PROVIDER_SITE_OTHER): Admitting: Nurse Practitioner

## 2023-08-08 VITALS — BP 114/68 | HR 80 | Temp 98.0°F | Ht 61.0 in | Wt 120.0 lb

## 2023-08-08 DIAGNOSIS — J4520 Mild intermittent asthma, uncomplicated: Secondary | ICD-10-CM | POA: Diagnosis not present

## 2023-08-08 DIAGNOSIS — K2950 Unspecified chronic gastritis without bleeding: Secondary | ICD-10-CM | POA: Diagnosis not present

## 2023-08-08 MED ORDER — ALBUTEROL SULFATE (2.5 MG/3ML) 0.083% IN NEBU
2.5000 mg | INHALATION_SOLUTION | Freq: Four times a day (QID) | RESPIRATORY_TRACT | 0 refills | Status: AC | PRN
Start: 2023-08-08 — End: ?

## 2023-08-08 MED ORDER — ONDANSETRON HCL 4 MG PO TABS: 4.0000 mg | ORAL_TABLET | Freq: Three times a day (TID) | ORAL | 0 refills | Status: AC | PRN

## 2023-08-08 MED ORDER — ALBUTEROL SULFATE HFA 108 (90 BASE) MCG/ACT IN AERS: 2.0000 | INHALATION_SPRAY | Freq: Four times a day (QID) | RESPIRATORY_TRACT | 0 refills | Status: AC | PRN

## 2023-08-08 NOTE — Assessment & Plan Note (Addendum)
 Use of albuterol prn in last 2months Reports increased use 2months ago while on cruise ship due to URI and motion sickness. Symptoms have since resolved and used inhaler sparingly since then She is requesting for albuterol hand held inhaler and nebulizer due to upcoming travel out of the country.  Albuterol sent Advised to avoid marijuana use and even second hand exposure.

## 2023-08-08 NOTE — Patient Instructions (Addendum)
 Schedule another appointment if breast returns and does not resolve within 1week.  Breast Self-Awareness Breast self-awareness means being familiar with how your breasts look and feel. It involves checking your breasts regularly and telling your health care provider about any changes. Practicing breast self-awareness helps to maintain breast health. Sometimes, changes are not harmful (are benign). Other times, a change in your breasts can be a sign of a serious medical problem. Being familiar with the look and feel of your breasts can help you catch a breast problem while it is still small and can be treated. You should do breast self-exams even if you have breast implants. What you need: A mirror. A well-lit room. A pillow or other soft object. How to do a breast self-exam A breast self-exam is one way to learn what is normal for your breasts and whether your breasts are changing. To do a breast self-exam: Look for changes  Remove all the clothing above your waist. Stand in front of a mirror in a room with good lighting. Put your hands down at your sides. Compare your breasts in the mirror. Look for differences between them (asymmetry), such as: Differences in shape. Differences in size. Puckers, dips, and bumps in one breast and not the other. Look at each breast for changes in the skin, such as: Redness. Scaly areas. Skin thickening. Dimpling. Open sores (ulcers). Look for changes in your nipples, such as: Discharge. Bleeding. Dimpling. Redness. A nipple that looks pushed in (retracted), or that has changed position. Feel for changes Carefully feel your breasts for lumps and changes. It is best to do this self-exam while lying down. Follow these steps to feel each breast: Place a pillow under the shoulder of one side of your body. Place the arm of that side of your body behind your head. Feel the breast of that side of your body using the hand of the opposite arm. To do  this: Start in the nipple area and use the pads of your three middle fingers to make -inch (2 cm) overlapping circles. Use light, medium, and then firm pressure as you feel your breast, gently covering the entire breast area and armpit. Continue the overlapping circles, moving downward over the breast until you feel your ribs below your breast. Then, make circles with your fingers going upward until you reach your collarbone. Next, make circles by moving outward across your breast and into your armpit area. Squeeze the nipple. Check for discharge and lumps. Repeat steps 1-7 to check your other breast. Sit or stand in the tub or shower. With soapy water on your skin, feel each breast the same way you did when you were lying down. Write down what you find Writing down what you find can help you remember what to discuss with your health care provider. Write down: What is normal for each breast. Any changes that you find in each breast. These include: The kind of changes you find. Any pain or tenderness. Size and location of any lumps. Where you are in your menstrual cycle, if you are still getting your menstrual period (menstruating). General tips If you are breastfeeding, the best time to examine your breasts is after a feeding or after using a breast pump. If you menstruate, the best time to examine your breasts is 5-7 days after your menstrual period. Breasts are generally lumpier during menstrual periods, and it may be more difficult to notice changes. With time and practice, you will become more familiar with the differences  in your breasts and more comfortable with the exam. Contact a health care provider if: You see a change in the shape or size of your breasts or nipples. You see a change in the skin of your breast or nipples, such as a reddened or scaly area. You have unusual discharge from your nipples. You find a new lump or thick area. You have breast pain. You have any concerns  about your breast health. Summary Breast self-awareness includes looking for physical changes in your breasts and feeling for any changes within your breasts. Breast self-awareness should be done in front of a mirror in a well-lit room. If you menstruate, the best time to examine your breasts is 5-7 days after your menstrual period. Tell your health care provider about any changes you notice in your breasts. Changes include changes in size, changes on the skin, pain or tenderness, or unusual fluid from your nipples. This information is not intended to replace advice given to you by your health care provider. Make sure you discuss any questions you have with your health care provider. Document Revised: 10/06/2021 Document Reviewed: 03/03/2021 Elsevier Patient Education  2024 [employer].
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
## 2023-08-08 NOTE — Assessment & Plan Note (Addendum)
 Marijuana use: every 1-2 weeks Reports Intermittent nausea with increased stress No improvement with previous PPI. Denies any heartburn or ABDOMEN pain.  Zofran sent Advised to stop use of marijuana

## 2023-08-09 ENCOUNTER — Telehealth: Payer: Self-pay | Admitting: Nurse Practitioner

## 2023-08-09 NOTE — Telephone Encounter (Signed)
 Dismissal printed and sent via mail.

## 2023-08-09 NOTE — Telephone Encounter (Signed)
-----   Message from [place] sent at 08/08/2023 12:47 PM EDT ----- Regarding: Dismissal Please look at my appointment note today and dismissal letter.   CN

## 2023-08-29 ENCOUNTER — Encounter: Admitting: Nurse Practitioner

## 2023-08-30 ENCOUNTER — Emergency Department (HOSPITAL_BASED_OUTPATIENT_CLINIC_OR_DEPARTMENT_OTHER)
Admission: EM | Admit: 2023-08-30 | Discharge: 2023-08-30 | Disposition: A | Discharge status: Home / Self Care | Attending: Emergency Medicine | Admitting: Emergency Medicine

## 2023-08-30 ENCOUNTER — Emergency Department (HOSPITAL_BASED_OUTPATIENT_CLINIC_OR_DEPARTMENT_OTHER)

## 2023-08-30 ENCOUNTER — Encounter (HOSPITAL_BASED_OUTPATIENT_CLINIC_OR_DEPARTMENT_OTHER): Payer: Self-pay | Admitting: Emergency Medicine

## 2023-08-30 DIAGNOSIS — Z79899 Other long term (current) drug therapy: Secondary | ICD-10-CM | POA: Diagnosis not present

## 2023-08-30 DIAGNOSIS — E860 Dehydration: Secondary | ICD-10-CM | POA: Diagnosis not present

## 2023-08-30 DIAGNOSIS — R55 Syncope and collapse: Secondary | ICD-10-CM | POA: Diagnosis not present

## 2023-08-30 DIAGNOSIS — R1084 Generalized abdominal pain: Secondary | ICD-10-CM | POA: Diagnosis present

## 2023-08-30 DIAGNOSIS — J45909 Unspecified asthma, uncomplicated: Secondary | ICD-10-CM | POA: Insufficient documentation

## 2023-08-30 DIAGNOSIS — K529 Noninfective gastroenteritis and colitis, unspecified: Secondary | ICD-10-CM | POA: Insufficient documentation

## 2023-08-30 HISTORY — DX: Unspecified chronic gastritis without bleeding: K29.50

## 2023-08-30 LAB — URINALYSIS, ROUTINE W REFLEX MICROSCOPIC
Bilirubin Urine: NEGATIVE
Glucose, UA: NEGATIVE mg/dL
Ketones, ur: >=80 mg/dL — AB
Leukocytes,Ua: NEGATIVE
Nitrite: NEGATIVE
Protein, ur: 100 mg/dL — AB
Specific Gravity, Urine: 1.02 (ref 1.005–1.030)
pH: 6 (ref 5.0–8.0)

## 2023-08-30 LAB — CBC
HCT: 37 % (ref 36.0–46.0)
Hemoglobin: 12.1 g/dL (ref 12.0–15.0)
MCH: 30.5 pg (ref 26.0–34.0)
MCHC: 32.7 g/dL (ref 30.0–36.0)
MCV: 93.2 fL (ref 80.0–100.0)
Platelets: 352 10*3/uL (ref 150–400)
RBC: 3.97 MIL/uL (ref 3.87–5.11)
RDW: 11.9 % (ref 11.5–15.5)
WBC: 13.8 10*3/uL — ABNORMAL HIGH (ref 4.0–10.5)
nRBC: 0 % (ref 0.0–0.2)

## 2023-08-30 LAB — RAPID URINE DRUG SCREEN, HOSP PERFORMED
Amphetamines: NOT DETECTED
Barbiturates: NOT DETECTED
Benzodiazepines: NOT DETECTED
Cocaine: NOT DETECTED
Opiates: NOT DETECTED
Tetrahydrocannabinol: POSITIVE — AB

## 2023-08-30 LAB — COMPREHENSIVE METABOLIC PANEL WITH GFR
ALT: 23 U/L (ref 0–44)
AST: 22 U/L (ref 15–41)
Albumin: 4.5 g/dL (ref 3.5–5.0)
Alkaline Phosphatase: 62 U/L (ref 38–126)
Anion gap: 11 (ref 5–15)
BUN: 11 mg/dL (ref 6–20)
CO2: 21 mmol/L — ABNORMAL LOW (ref 22–32)
Calcium: 9.4 mg/dL (ref 8.9–10.3)
Chloride: 106 mmol/L (ref 98–111)
Creatinine, Ser: 0.71 mg/dL (ref 0.44–1.00)
GFR, Estimated: >60 mL/min (ref >60)
Glucose, Bld: 88 mg/dL (ref 70–99)
Potassium: 3.4 mmol/L — ABNORMAL LOW (ref 3.5–5.1)
Sodium: 138 mmol/L (ref 135–145)
Total Bilirubin: 0.8 mg/dL (ref 0.0–1.2)
Total Protein: 8.4 g/dL — ABNORMAL HIGH (ref 6.5–8.1)

## 2023-08-30 LAB — URINALYSIS, MICROSCOPIC (REFLEX)

## 2023-08-30 LAB — LIPASE, BLOOD: Lipase: 23 U/L (ref 11–51)

## 2023-08-30 LAB — PREGNANCY, URINE: Preg Test, Ur: NEGATIVE

## 2023-08-30 MED ORDER — IOHEXOL 300 MG/ML  SOLN
100.0000 mL | Freq: Once | INTRAMUSCULAR | Status: AC | PRN
  Administered 2023-08-30: 100 mL via INTRAVENOUS

## 2023-08-30 MED ORDER — ONDANSETRON 4 MG PO TBDP: 4.0000 mg | ORAL_TABLET | Freq: Three times a day (TID) | ORAL | 0 refills | Status: DC | PRN

## 2023-08-30 MED ORDER — KETOROLAC TROMETHAMINE 15 MG/ML IJ SOLN
15.0000 mg | Freq: Once | INTRAMUSCULAR | Status: AC
  Administered 2023-08-30: 15 mg via INTRAVENOUS
  Filled 2023-08-30: qty 1

## 2023-08-30 MED ORDER — DICYCLOMINE HCL 10 MG PO CAPS
10.0000 mg | ORAL_CAPSULE | Freq: Once | ORAL | Status: AC
  Administered 2023-08-30: 10 mg via ORAL
  Filled 2023-08-30: qty 1

## 2023-08-30 MED ORDER — MORPHINE SULFATE 15 MG PO TABS: 15.0000 mg | ORAL_TABLET | Freq: Four times a day (QID) | ORAL | 0 refills | Status: DC | PRN

## 2023-08-30 MED ORDER — SODIUM CHLORIDE 0.9 % IV BOLUS
1000.0000 mL | Freq: Once | INTRAVENOUS | Status: AC
  Administered 2023-08-30: 1000 mL via INTRAVENOUS

## 2023-08-30 MED ORDER — MORPHINE SULFATE (PF) 4 MG/ML IV SOLN
4.0000 mg | Freq: Once | INTRAVENOUS | Status: AC
  Administered 2023-08-30: 4 mg via INTRAVENOUS
  Filled 2023-08-30: qty 1

## 2023-08-30 MED ORDER — ONDANSETRON HCL 4 MG/2ML IJ SOLN
4.0000 mg | Freq: Once | INTRAMUSCULAR | Status: AC
  Administered 2023-08-30: 4 mg via INTRAVENOUS
  Filled 2023-08-30: qty 2

## 2023-08-30 NOTE — ED Provider Notes (Signed)
 Patient presenting today with abdominal pain nausea and vomiting.  Patient's labs positive for marijuana and white count of 13.  Despite improvement in nausea continues to have abdominal pain and abdominal CT is pending.  I have independently visualized and interpreted pt's images today.  CT today is negative for obstruction or renal stones.  Radiology does report mild colitis and periportal edema which is nonspecific as well as a 4.2 cm right adnexal cyst.  Patient's LFTs are within normal limits, UA does show greater than 80 ketones consistent with dehydration but no signs of infection and pregnancy test is negative.  On repeat evaluation patient is holding down fluids but reports when she eats she still feels nauseated.  At this time feel that patient can be discharged home with a liquid diet progressing back to her normal diet.  She was given antiemetics and a short course of pain medication.    Almond Army, MD 08/30/23 [phone]
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
## 2023-08-30 NOTE — ED Notes (Signed)
 Pt alert and oriented X 4 at the time of discharge. RR even and unlabored. No acute distress noted. Pt verbalized understanding of discharge instructions as discussed. Pt ambulatory to lobby at time of discharge.

## 2023-08-30 NOTE — ED Provider Notes (Signed)
 Emergency Department Provider Note   I have reviewed the triage vital signs and the nursing notes.   HISTORY  Chief Complaint Emesis   HPI Rebecca Rocha is a 23 y.o. female with past history reviewed below presents to the emergency department for evaluation of diffuse abdominal discomfort, vomiting, diarrhea, syncope.  Symptoms been ongoing for the past 3 days.  She has no associated headache which is typical of her migraines.  No fevers.  She states with the vomiting she has had multiple episodes of syncope, mainly with vomiting but sometimes with standing.  She has not been able to tolerate significant liquids or solids.  Denies any chest pain or palpitations.   Notes that yesterday she had some increased stress and transient suicidal thoughts.  Mom is at bedside and states they work through these and have been referred to an outpatient counselor.  She is not feeling suicidal or homicidal currently and feels safe from a psychiatry perspective.    Past Medical History:  Diagnosis Date   Acute calculous cholecystitis 11/26/2019   Allergy    Anxiety    Asthma    Chlamydia 02/2023   treatment sent.  needs TOC   Chronic headache disorder    Chronic upper back pain    Depression    Gastritis, chronic    Menstrual migraine    Panic attack    Sciatica    whole body   Sinusitis    Sleep apnea    Sore of lip 11/24/2021   STD (sexually transmitted disease)    chlamydia treated 10/24   Syncope and collapse    Vertigo of central origin, unspecified ear     Review of Systems  Constitutional: No fever/chills Cardiovascular: Denies chest pain. Positive syncope.  Respiratory: Denies shortness of breath. Gastrointestinal: Positive abdominal pain. Positive nausea, vomiting, and diarrhea.  No constipation. Skin: Negative for rash. Neurological: Positive HA.   ____________________________________________   PHYSICAL EXAM:  VITAL SIGNS: ED Triage Vitals   Encounter Vitals Group     BP 08/30/23 1218 124/81     Pulse Rate 08/30/23 1218 (!) 59     Resp 08/30/23 1218 17     Temp 08/30/23 1218 98.2 F (36.8 C)     Temp Source 08/30/23 1218 Oral     SpO2 08/30/23 1218 99 %     Weight 08/30/23 1219 119 lb (54 kg)   Constitutional: Alert and oriented. Well appearing and in no acute distress. Eyes: Conjunctivae are normal. PERRL. Head: Atraumatic. Nose: No congestion/rhinnorhea. Mouth/Throat: Mucous membranes are moist.   Neck: No stridor.   Cardiovascular: Normal rate, regular rhythm. Good peripheral circulation. Grossly normal heart sounds.   Respiratory: Normal respiratory effort.  No retractions. Lungs CTAB. Gastrointestinal: Soft with mild diffuse tenderness. No peritonitis of focal tenderness. No distention.  Musculoskeletal: No lower extremity tenderness nor edema. No gross deformities of extremities. Neurologic:  Normal speech and language. No gross focal neurologic deficits are appreciated.  Skin:  Skin is warm, dry and intact. No rash noted. ____________________________________________   LABS (all labs ordered are listed, but only abnormal results are displayed)  Labs Reviewed  COMPREHENSIVE METABOLIC PANEL WITH GFR - Abnormal; Notable for the following components:      Result Value   Potassium 3.4 (*)    CO2 21 (*)    Total Protein 8.4 (*)    All other components within normal limits  CBC - Abnormal; Notable for the following components:   WBC 13.8 (*)  All other components within normal limits  URINALYSIS, ROUTINE W REFLEX MICROSCOPIC - Abnormal; Notable for the following components:   APPearance HAZY (*)    Hgb urine dipstick LARGE (*)    Ketones, ur >=80 (*)    Protein, ur 100 (*)    All other components within normal limits  URINALYSIS, MICROSCOPIC (REFLEX) - Abnormal; Notable for the following components:   Bacteria, UA RARE (*)    All other components within normal limits  RAPID URINE DRUG SCREEN, HOSP  PERFORMED - Abnormal; Notable for the following components:   Tetrahydrocannabinol POSITIVE (*)    All other components within normal limits  LIPASE, BLOOD  PREGNANCY, URINE   ____________________________________________  EKG   EKG Interpretation Date/Time:  Thursday August 30 2023 13:22:56 EDT Ventricular Rate:  51 PR Interval:  143 QRS Duration:  75 QT Interval:  441 QTC Calculation: 407 R Axis:   87  Text Interpretation: Sinus rhythm Low voltage, precordial leads Nonspecific T abnrm, anterolateral leads ST elev, probable normal early repol pattern Confirmed by Abby Hocking [phone]) on 08/30/2023 1:29:44 PM       ____________________________________________   PROCEDURES  Procedure(s) performed:   Procedures  None ____________________________________________   INITIAL IMPRESSION / ASSESSMENT AND PLAN / ED COURSE  Pertinent labs & imaging results that were available during my care of the patient were reviewed by me and considered in my medical decision making (see chart for details).   This patient is Presenting for Evaluation of abdominal pain, which does require a range of treatment options, and is a complaint that involves a high risk of morbidity and mortality.  The Differential Diagnoses includes but is not exclusive to acute cholecystitis, intrathoracic causes for epigastric abdominal pain, gastritis, duodenitis, pancreatitis, small bowel or large bowel obstruction, abdominal aortic aneurysm, hernia, gastritis, etc.   Critical Interventions-    Medications  sodium chloride  0.9 % bolus 1,000 mL (0 mLs Intravenous Stopped 08/30/23 1657)  ondansetron  (ZOFRAN ) injection 4 mg (4 mg Intravenous Given 08/30/23 1314)  dicyclomine  (BENTYL ) capsule 10 mg (10 mg Oral Given 08/30/23 1314)  morphine  (PF) 4 MG/ML injection 4 mg (4 mg Intravenous Given 08/30/23 1428)  iohexol  (OMNIPAQUE ) 300 MG/ML solution 100 mL (100 mLs Intravenous Contrast Given 08/30/23 1539)  ketorolac   (TORADOL ) 15 MG/ML injection 15 mg (15 mg Intravenous Given 08/30/23 1825)    Reassessment after intervention: symptoms improved.   Clinical Laboratory Tests Ordered, included THC positive on UDS. CMP with normal LFTs. Mild leukocytosis at 13.8. No UTI.   Radiologic Tests: CT abdomen/pelvis pending at time of handoff.   Cardiac Monitor Tracing which shows NSR.    Social Determinants of Health Risk patient does regularly use THC.   Medical Decision Making: Summary:  Patient presents emergency department with abdominal pain with vomiting and diarrhea along with headache.  Overall vital signs are within normal limits.  No SIRS vitals.  Doubt sepsis.  No abdomen diffusely soft with minimal tenderness.  Hold on CT imaging for now, pending labs.   Reevaluation with update and discussion with patient. Care transferred to Dr. Leida Puna pending CT.   Considered admission but workup is pending.   Patient's presentation is most consistent with acute presentation with potential threat to life or bodily function.   Disposition: pending   ____________________________________________  FINAL CLINICAL IMPRESSION(S) / ED DIAGNOSES  Final diagnoses:  Dehydration  Colitis    Note:  This document was prepared using Dragon voice recognition software and may include unintentional dictation errors.  Abby Hocking, MD, [facility] Emergency Medicine    Dianely Krehbiel, Shereen Dike, MD 08/31/23 [phone]

## 2023-09-01 ENCOUNTER — Other Ambulatory Visit: Payer: Self-pay | Admitting: Nurse Practitioner

## 2023-09-01 DIAGNOSIS — J4520 Mild intermittent asthma, uncomplicated: Secondary | ICD-10-CM

## 2023-09-05 ENCOUNTER — Ambulatory Visit: Admitting: Obstetrics and Gynecology

## 2023-09-05 ENCOUNTER — Encounter: Payer: Self-pay | Admitting: Obstetrics and Gynecology

## 2023-09-05 VITALS — BP 98/62 | HR 69 | Temp 98.3°F | Ht 61.75 in | Wt 114.0 lb

## 2023-09-05 DIAGNOSIS — Z1331 Encounter for screening for depression: Secondary | ICD-10-CM | POA: Diagnosis not present

## 2023-09-05 DIAGNOSIS — N83201 Unspecified ovarian cyst, right side: Secondary | ICD-10-CM | POA: Insufficient documentation

## 2023-09-05 DIAGNOSIS — A749 Chlamydial infection, unspecified: Secondary | ICD-10-CM

## 2023-09-05 DIAGNOSIS — N6311 Unspecified lump in the right breast, upper outer quadrant: Secondary | ICD-10-CM

## 2023-09-05 DIAGNOSIS — Z01419 Encounter for gynecological examination (general) (routine) without abnormal findings: Secondary | ICD-10-CM | POA: Diagnosis not present

## 2023-09-05 DIAGNOSIS — Z3041 Encounter for surveillance of contraceptive pills: Secondary | ICD-10-CM

## 2023-09-05 DIAGNOSIS — Z113 Encounter for screening for infections with a predominantly sexual mode of transmission: Secondary | ICD-10-CM

## 2023-09-05 DIAGNOSIS — E559 Vitamin D deficiency, unspecified: Secondary | ICD-10-CM

## 2023-09-05 LAB — PREGNANCY, URINE: Preg Test, Ur: NEGATIVE

## 2023-09-05 MED ORDER — DROSPIRENONE-ETHINYL ESTRADIOL 3-0.03 MG PO TABS: 1.0000 | ORAL_TABLET | Freq: Every day | ORAL | 11 refills | Status: AC

## 2023-09-05 NOTE — Progress Notes (Signed)
 23 y.o. G0P0000 female right ovarian cyst, stable liver hemangioma here for annual exam. Married.  Patient's last menstrual period was 08/28/2023 (approximate). Period Duration (Days): 5-7 Period Pattern: Regular Menstrual Flow: Moderate Menstrual Control: Maxi pad, Tampon Dysmenorrhea: (!) Mild Dysmenorrhea Symptoms: Headache  Having severe pelvic pain at night 2-3x/wk since Oct 2024. Taking ibuprofen  as needed Went to ED 4/17, CT scan showed 4.2cm right adnexal cyst (increased from Oct 2024 TVUS), mild colitis and periportal edema (normal CMP) 02/2023 TVUS with 2.0 cm right ovarian complex mass  Abnormal bleeding: none Pelvic discharge or pain: yes, as noted above Breast mass, nipple discharge or skin changes : known right benign breast mass, no change (06/28/21 US : 15mm at 9:30) Birth control: stopped Depo, her and husband would like to get pregnant Last PAP:     Component Value Date/Time   DIAGPAP  10/14/2020 0836    - Negative for intraepithelial lesion or malignancy (NILM)   ADEQPAP  10/14/2020 0836    Satisfactory for evaluation; transformation zone component PRESENT.   Gardasil:  complete Sexually active: yes  Exercising: Yes. Walk and Running  Smoker: no, occasional marijuana use  Flowsheet Row Office Visit from 09/05/2023 [facility]  PHQ-2 Total Score 6       Flowsheet Row Office Visit from 09/05/2023 in [facility]  PHQ-9 Total Score 27     Patient experiencing significant work stress.  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  She is now working from home to decrease the stress. She has a history of self-harm.  And was experiencing suicidal and homicidal ideation earlier this week. She has a psych appt scheduled 09/07/23. No active SI. Husband has removed guns from the home  GYN HISTORY: Chlamydia, treated 02/2023 &03/2023  OB  History  Gravida Para Term Preterm AB Living  0 0 0 0 0 0  SAB IAB Ectopic Multiple Live Births  0 0 0 0 0    Past Medical History:  Diagnosis Date   Acute calculous cholecystitis 11/26/2019   Allergy    Anxiety    Asthma    Chlamydia 02/2023   treatment sent.  needs TOC   Chronic headache disorder    Chronic upper back pain    Depression    Gastritis, chronic    Menstrual migraine    Panic attack    Sciatica    whole body   Sinusitis    Sleep apnea    Sore of lip 11/24/2021   STD (sexually transmitted disease)    chlamydia treated 10/24   Syncope and collapse    Vertigo of central origin, unspecified ear     Past Surgical History:  Procedure Laterality Date   CHOLECYSTECTOMY N/A 11/26/2019   Procedure: LAPAROSCOPIC CHOLECYSTECTOMY;  Surgeon: Derral Flick, MD;  Location: [facility] ORS;  Service: General;  Laterality: N/A;    Current Outpatient Medications on File Prior to Visit  Medication Sig Dispense Refill   albuterol  (PROVENTIL ) (2.5 MG/3ML) 0.083% nebulizer solution Take 3 mLs (2.5 mg total) by nebulization every 6 (six) hours as needed for wheezing or shortness of breath. 50 mL 0   albuterol  (VENTOLIN  HFA) 108 (90 Base) MCG/ACT inhaler Inhale 2 puffs into the lungs every 6 (six) hours as needed for wheezing or shortness of breath. 6.7 g 0   morphine  (MSIR) 15 MG tablet Take 1 tablet (15 mg total) by mouth  every 6 (six) hours as needed for severe pain (pain score 7-10). 6 tablet 0   ondansetron  (ZOFRAN ) 4 MG tablet Take 1 tablet (4 mg total) by mouth every 8 (eight) hours as needed for nausea or vomiting. 15 tablet 0   No current facility-administered medications on file prior to visit.    Social History   Socioeconomic History   Marital status: Married    Spouse name: Not on file   Number of children: 0   Years of education: Not on file   Highest education level: Associate degree: occupational, Scientist, product/process development, or vocational program  Occupational History    Not on file  Tobacco Use   Smoking status: Never   Smokeless tobacco: Never  Vaping Use   Vaping status: Never Used  Substance and Sexual Activity   Alcohol use: Not Currently   Drug use: Yes    Types: Marijuana    Comment: occ   Sexual activity: Yes    Partners: Male    Comment: 1st intercourse- 18, partners- 5  Other Topics Concern   Not on file  Social History Narrative   Consulting civil engineer at [employer]   Social Drivers of Health   Financial Resource Strain: Low Risk  (08/07/2023)   Overall Financial Resource Strain (CARDIA)    Difficulty of Paying Living Expenses: Not hard at all  Food Insecurity: No Food Insecurity (08/07/2023)   Hunger Vital Sign    Worried About Running Out of Food in the Last Year: Never true    Ran Out of Food in the Last Year: Never true  Transportation Needs: Patient Declined (08/07/2023)   PRAPARE - Transportation    Lack of Transportation (Medical): Patient declined    Lack of Transportation (Non-Medical): Patient declined  Physical Activity: Sufficiently Active (08/07/2023)   Exercise Vital Sign    Days of Exercise per Week: 7 days    Minutes of Exercise per Session: 150+ min  Stress: Stress Concern Present (08/07/2023)   [employer] of Occupational Health - Occupational Stress Questionnaire    Feeling of Stress : Very much  Social Connections: Moderately Isolated (08/07/2023)   Social Connection and Isolation Panel [NHANES]    Frequency of Communication with Friends and Family: More than three times a week    Frequency of Social Gatherings with Friends and Family: Once a week    Attends Religious Services: Never    Database administrator or Organizations: No    Attends Engineer, structural: Not on file    Marital Status: Married  Catering manager Violence: Not on file    Family History  Problem Relation Age of Onset   Hypertension Mother    Liver disease Mother    Anxiety disorder Mother    Kidney disease Mother    Other Mother         Negative hereditary cancer genetic testing   Hypertension Father    Diabetes Father    Anxiety disorder Father    Anxiety disorder Sister    Depression Sister    Breast cancer Maternal Grandmother 57   Diabetes Paternal Grandmother    Hypertension Paternal Grandmother    High Cholesterol Paternal Grandmother    Cancer Paternal Grandfather    Diabetes Paternal Grandfather    Hypertension Paternal Grandfather    High Cholesterol Paternal Grandfather    Other Cousin        maternal second cousin, BRCA2+   Esophageal cancer Neg Hx    Rectal cancer Neg Hx  Stomach cancer Neg Hx     No Known Allergies    PE Today's Vitals   09/05/23 1153  BP: 98/62  Pulse: 69  Temp: 98.3 F (36.8 C)  TempSrc: Oral  SpO2: 98%  Weight: 114 lb (51.7 kg)  Height: 5' 1.75" (1.568 m)   Body mass index is 21.02 kg/m.  Physical Exam Vitals reviewed. Exam conducted with a chaperone present.  Constitutional:      General: She is not in acute distress.    Appearance: Normal appearance.  HENT:     Head: Normocephalic and atraumatic.     Nose: Nose normal.  Eyes:     Extraocular Movements: Extraocular movements intact.     Conjunctiva/sclera: Conjunctivae normal.  Neck:     Thyroid : No thyroid  mass, thyromegaly or thyroid  tenderness.  Pulmonary:     Effort: Pulmonary effort is normal.  Chest:     Chest wall: No mass or tenderness.  Breasts:    Right: Normal. No swelling, mass, nipple discharge, skin change or tenderness.     Left: Normal. No swelling, mass, nipple discharge, skin change or tenderness.  Abdominal:     General: There is no distension.     Palpations: Abdomen is soft.     Tenderness: There is no abdominal tenderness.  Genitourinary:    General: Normal vulva.     Exam position: Lithotomy position.     Urethra: No prolapse.     Vagina: Normal. No vaginal discharge or bleeding.     Cervix: Normal. No lesion.     Uterus: Normal. Not enlarged and not tender.      Adnexa:  Right adnexa normal and left adnexa normal.     Comments: Mild TTP RLQ Musculoskeletal:        General: Normal range of motion.     Cervical back: Normal range of motion.  Lymphadenopathy:     Upper Body:     Right upper body: No axillary adenopathy.     Left upper body: No axillary adenopathy.     Lower Body: No right inguinal adenopathy. No left inguinal adenopathy.  Skin:    General: Skin is warm and dry.  Neurological:     General: No focal deficit present.     Mental Status: She is alert.  Psychiatric:        Mood and Affect: Mood normal.        Behavior: Behavior normal.      Assessment and Plan:        Well woman exam with routine gynecological exam Assessment & Plan: Will need PAP with US  appt Labs and immunizations with her primary Encouraged safe sexual practices as indicated Encouraged healthy lifestyle practices with diet and exercise For patients under 50yo, I recommend 1000mg  calcium daily and 600IU of vitamin D  daily.   Orders: -     VITAMIN D  25 Hydroxy (Vit-D Deficiency, Fractures) -     Lipid panel  Screen for STD (sexually transmitted disease) -     SURESWAB CT/NG/T. vaginalis  Oral contraceptive pill surveillance -     Drospirenone -Ethinyl Estradiol ; Take 1 tablet by mouth daily.  Dispense: 28 tablet; Refill: 11 -     Pregnancy, urine  Right ovarian cyst Assessment & Plan: 02/2023 TVUS with 2.0 cm right ovarian complex mass 08/30/23, CT scan showed 4.2cm right adnexal cyst (increased from Oct 2024 TVUS), mild colitis and periportal edema (normal CMP) Discussed that pelvic pain could be related to intermittent torsion, however nonsurgical abdomen  today to no acute intervention recommended. Pain could also be 2/ 2 pelvic inflammation given persistent CT infection over 6 months. No CMT on exam today. Warning signs reviewed RTO in 4 weeks for f/u   Orders: -     US  PELVIS TRANSVAGINAL NON-OB (TV ONLY); Future  Mass of upper outer quadrant of right  breast Assessment & Plan: 06/28/21 US : 15mm at 9:30 No appreciated on exam today, stable  Positive depression screening Has appt 09/07/23 and good support through her mother  Chlamydia -     Doxycycline Monohydrate; Take 1 capsule (100 mg total) by mouth 2 (two) times daily for 7 days.  Dispense: 14 capsule; Refill: 0  Vitamin D  insufficiency -     Vitamin D ; Take 1 tablet (2,000 Units total) by mouth daily.  Dispense: 90 tablet; Refill: 3    Romaine Closs, MD

## 2023-09-05 NOTE — Patient Instructions (Signed)

## 2023-09-06 LAB — SURESWAB CT/NG/T. VAGINALIS
C. trachomatis RNA, TMA: DETECTED — AB
N. gonorrhoeae RNA, TMA: NOT DETECTED
Trichomonas vaginalis RNA: NOT DETECTED

## 2023-09-06 LAB — LIPID PANEL
Cholesterol: 106 mg/dL (ref <200)
HDL: 47 mg/dL — ABNORMAL LOW (ref >=50)
LDL Cholesterol (Calc): 49 mg/dL
Non-HDL Cholesterol (Calc): 59 mg/dL (ref <130)
Total CHOL/HDL Ratio: 2.3 (calc) (ref <5.0)
Triglycerides: 35 mg/dL (ref <150)

## 2023-09-06 LAB — VITAMIN D 25 HYDROXY (VIT D DEFICIENCY, FRACTURES): Vit D, 25-Hydroxy: 25 ng/mL — ABNORMAL LOW (ref 30–100)

## 2023-09-10 ENCOUNTER — Other Ambulatory Visit: Payer: Self-pay

## 2023-09-10 ENCOUNTER — Encounter: Payer: Self-pay | Admitting: Obstetrics and Gynecology

## 2023-09-10 DIAGNOSIS — Z01419 Encounter for gynecological examination (general) (routine) without abnormal findings: Secondary | ICD-10-CM | POA: Insufficient documentation

## 2023-09-10 MED ORDER — DOXYCYCLINE MONOHYDRATE 100 MG PO CAPS: 100.0000 mg | ORAL_CAPSULE | Freq: Two times a day (BID) | ORAL | 0 refills | Status: AC

## 2023-09-10 MED ORDER — VITAMIN D 50 MCG (2000 UT) PO TABS: 2000.0000 [IU] | ORAL_TABLET | Freq: Every day | ORAL | 3 refills | Status: AC

## 2023-09-10 NOTE — Assessment & Plan Note (Signed)
 No active SI or HI Recommend contraception until psych evaluation and improved psych status To call with worsening sx

## 2023-09-10 NOTE — Assessment & Plan Note (Signed)
 02/2023 TVUS with 2.0 cm right ovarian complex mass 08/30/23, CT scan showed 4.2cm right adnexal cyst (increased from Oct 2024 TVUS), mild colitis and periportal edema (normal CMP) Discussed that pelvic pain could be related to intermittent torsion, however nonsurgical abdomen today to no acute intervention recommended. Pain could also be 2/ 2 pelvic inflammation given persistent CT infection over 6 months. No CMT on exam today. Warning signs reviewed RTO in 4 weeks for f/u

## 2023-09-10 NOTE — Assessment & Plan Note (Signed)
 06/28/21 US : 15mm at 9:30 No appreciated on exam today, stable

## 2023-09-10 NOTE — Assessment & Plan Note (Addendum)
 Will need PAP with US  appt Labs and immunizations with her primary Encouraged safe sexual practices as indicated Encouraged healthy lifestyle practices with diet and exercise For patients under 25yo, I recommend 1000mg  calcium daily and 600IU of vitamin D  daily.

## 2023-09-14 ENCOUNTER — Other Ambulatory Visit: Payer: Self-pay

## 2023-09-14 DIAGNOSIS — A749 Chlamydial infection, unspecified: Secondary | ICD-10-CM

## 2023-09-14 MED ORDER — DOXYCYCLINE HYCLATE 100 MG PO CAPS: 100.0000 mg | ORAL_CAPSULE | Freq: Two times a day (BID) | ORAL | 0 refills | Status: AC

## 2023-10-02 ENCOUNTER — Other Ambulatory Visit: Admitting: Obstetrics and Gynecology

## 2023-10-02 ENCOUNTER — Other Ambulatory Visit

## 2023-10-17 ENCOUNTER — Encounter: Payer: Self-pay | Admitting: Obstetrics and Gynecology

## 2023-10-17 ENCOUNTER — Ambulatory Visit (INDEPENDENT_AMBULATORY_CARE_PROVIDER_SITE_OTHER): Admitting: Obstetrics and Gynecology

## 2023-10-17 ENCOUNTER — Ambulatory Visit (INDEPENDENT_AMBULATORY_CARE_PROVIDER_SITE_OTHER)

## 2023-10-17 VITALS — BP 102/58 | HR 62 | Wt 116.0 lb

## 2023-10-17 DIAGNOSIS — Z113 Encounter for screening for infections with a predominantly sexual mode of transmission: Secondary | ICD-10-CM

## 2023-10-17 DIAGNOSIS — R102 Pelvic and perineal pain unspecified side: Secondary | ICD-10-CM

## 2023-10-17 DIAGNOSIS — N83201 Unspecified ovarian cyst, right side: Secondary | ICD-10-CM | POA: Diagnosis not present

## 2023-10-17 DIAGNOSIS — Z3009 Encounter for other general counseling and advice on contraception: Secondary | ICD-10-CM | POA: Diagnosis not present

## 2023-10-17 LAB — PREGNANCY, URINE: Preg Test, Ur: NEGATIVE

## 2023-10-17 MED ORDER — IBUPROFEN 800 MG PO TABS: 800.0000 mg | ORAL_TABLET | Freq: Three times a day (TID) | ORAL | 3 refills | Status: AC | PRN

## 2023-10-17 NOTE — Assessment & Plan Note (Addendum)
 History notable for chronic myofascial pain/?fibromyalgia and chronic right-sided lower back pain noted in 2022 in 2024. Prescribed Lyrica  but insurance never covered. Completed PT for lower back pain in 2024.

## 2023-10-17 NOTE — Assessment & Plan Note (Signed)
 02/2023 TVUS with 2.0 cm right ovarian complex mass 08/30/23, CT scan showed 4.2cm right adnexal cyst (increased from Oct 2024 TVUS), mild colitis and periportal edema (normal CMP) Reviewed TVUS with patient today which shows stable complex cyst of the right ovary.  Differential diagnosis includes hemorrhagic cyst versus endometrioma. Given pelvic pain offered medical management with COC versus surgical management with diagnostic lap and ovarian cystectomy. Patient would like to proceed with medical management and start COC today with follow-up ultrasound in 3 months. Will also send prescription for 800 mg ibuprofen  3 times daily as needed. Warning signs concerning for ovarian torsion reviewed with patient

## 2023-10-17 NOTE — Progress Notes (Signed)
 23 y.o. G16P0000 female with right ovarian cyst, stable liver hemangioma, recurrent chlamydia infection, chronic myofascial pain/?fibromyalgia(managed by PCP) here for TVUS. Married.  Patient's last menstrual period was 09/27/2023 (approximate).   At annual, she reported: Having severe pelvic pain at night 2-3x/wk since Oct 2024. Taking ibuprofen  as needed Went to ED 4/17, CT scan showed 4.2cm right adnexal cyst (increased from Oct 2024 TVUS), mild colitis and periportal edema (normal CMP) 02/2023 TVUS with 2.0 cm right ovarian complex mass  Diagnosed with chlamydia at last appointment and started on COC. Believes chlamydia diagnosis came from her friend using her sex toys.  She has discarded all the toys at this point.  Has not started birth control pill since last appointment.  Her and her husband have discussed waiting on pregnancy for 1 to 1-1/2 years in the setting of starting recent psychiatric medications.  Started on Prozac and Seroquel at the beginning of this month.  Following with Dr. Lincoln Renshaw at [facility].  Seeing once a month.  Pain is a 7.5 out of 10 today.  She has not started birth control pills and is treating pain with home remedies like tea, heating pads and hydration.  GYN HISTORY: Chlamydia, treated 02/2023 &03/2023   OB History  Gravida Para Term Preterm AB Living  0 0 0 0 0 0  SAB IAB Ectopic Multiple Live Births  0 0 0 0 0   Past Medical History:  Diagnosis Date   Acute calculous cholecystitis 11/26/2019   Allergy    Anxiety    Asthma    Chlamydia 02/2023   treatment sent.  needs TOC   Chronic headache disorder    Chronic upper back pain    Depression    Gastritis, chronic    Menstrual migraine    Panic attack    Sciatica    whole body   Sinusitis    Sleep apnea    Sore of lip 11/24/2021   STD (sexually transmitted disease)    chlamydia treated 10/24   Syncope and collapse    Vertigo of central origin, unspecified ear    Past  Surgical History:  Procedure Laterality Date   CHOLECYSTECTOMY N/A 11/26/2019   Procedure: LAPAROSCOPIC CHOLECYSTECTOMY;  Surgeon: Derral Flick, MD;  Location: [facility];  Service: General;  Laterality: N/A;   Current Outpatient Medications on File Prior to Visit  Medication Sig Dispense Refill   albuterol  (PROVENTIL ) (2.5 MG/3ML) 0.083% nebulizer solution Take 3 mLs (2.5 mg total) by nebulization every 6 (six) hours as needed for wheezing or shortness of breath. 50 mL 0   albuterol  (VENTOLIN  HFA) 108 (90 Base) MCG/ACT inhaler Inhale 2 puffs into the lungs every 6 (six) hours as needed for wheezing or shortness of breath. 6.7 g 0   Cholecalciferol (VITAMIN D ) 50 MCG (2000 UT) tablet Take 1 tablet (2,000 Units total) by mouth daily. 90 tablet 3   cyclobenzaprine  (FLEXERIL ) 5 MG tablet Take 5 mg by mouth.     FLUoxetine (PROZAC) 10 MG tablet Take 10 mg by mouth daily.     morphine  (MSIR) 15 MG tablet Take 1 tablet (15 mg total) by mouth every 6 (six) hours as needed for severe pain (pain score 7-10). 6 tablet 0   naproxen  (NAPROSYN ) 500 MG tablet Take 500 mg by mouth.     ondansetron  (ZOFRAN ) 4 MG tablet Take 1 tablet (4 mg total) by mouth every 8 (eight) hours as needed for nausea or vomiting. 15 tablet 0  QUEtiapine (SEROQUEL) 25 MG tablet Take by mouth.     drospirenone -ethinyl estradiol  (YASMIN  28) 3-0.03 MG tablet Take 1 tablet by mouth daily. (Patient not taking: Reported on 10/17/2023) 28 tablet 11   No current facility-administered medications on file prior to visit.   No Known Allergies    PE Today's Vitals   10/17/23 1042  BP: (!) 102/58  Pulse: 62  SpO2: (!) 87%  Weight: 116 lb (52.6 kg)   Body mass index is 21.39 kg/m.  Physical Exam Vitals reviewed.  Constitutional:      General: She is not in acute distress.    Appearance: Normal appearance.  HENT:     Head: Normocephalic and atraumatic.     Nose: Nose normal.  Eyes:     Extraocular Movements: Extraocular  movements intact.     Conjunctiva/sclera: Conjunctivae normal.  Pulmonary:     Effort: Pulmonary effort is normal.  Musculoskeletal:        General: Normal range of motion.     Cervical back: Normal range of motion.  Neurological:     General: No focal deficit present.     Mental Status: She is alert.  Psychiatric:        Mood and Affect: Mood normal.        Behavior: Behavior normal.     10/17/23 TVUS: Indications: Right ovarian cyst, pelvic pain  Findings:   Uterus: 8.7 x 4.2 x 3.8 cm, anteverted uterus. Endometrial thickness: 4.4 mm. Left ovary: 2.8 x 2.2 x 2.1 cm, normal-appearing. Right ovary: 4.7 x 4.6 x 4.3 cm, 4.3 cm complex cyst.  Ovarian blood flow preserved. No free fluid.  Impression:  Stable 4.3 cm complex cyst of the right ovary.  Consider hemorrhagic cyst versus endometrioma.  Romaine Closs, MD    Assessment and Plan:        Right ovarian cyst Assessment & Plan: 02/2023 TVUS with 2.0 cm right ovarian complex mass 08/30/23, CT scan showed 4.2cm right adnexal cyst (increased from Oct 2024 TVUS), mild colitis and periportal edema (normal CMP) Reviewed TVUS with patient today which shows stable complex cyst of the right ovary.  Differential diagnosis includes hemorrhagic cyst versus endometrioma. Given pelvic pain offered medical management with COC versus surgical management with diagnostic lap and ovarian cystectomy. Patient would like to proceed with medical management and start COC today with follow-up ultrasound in 3 months. Will also send prescription for 800 mg ibuprofen  3 times daily as needed. Warning signs concerning for ovarian torsion reviewed with patient   Orders: -     US  PELVIS TRANSVAGINAL NON-OB (TV ONLY); Future  Pelvic pain History notable for chronic myofascial pain/?fibromyalgia and chronic right-sided lower back pain noted in 2022 in 2024. Prescribed Lyrica  but insurance never covered. Completed PT for lower back pain in  2024. Plan as above -     Ibuprofen ; Take 1 tablet (800 mg total) by mouth every 8 (eight) hours as needed for up to 42 doses.  Dispense: 60 tablet; Refill: 3  Screen for STD (sexually transmitted disease) -     Hepatitis B surface antigen -     Hepatitis C antibody -     HIV Antibody (routine testing w rflx) -     RPR Given chlamydia diagnosis 3 times in the last 6 months, recommend complete STD screening.  Patient returning 6/10 for test of cure.  Encounter for counseling regarding contraception -     Pregnancy, urine    Romaine Closs, MD

## 2023-10-18 ENCOUNTER — Telehealth: Payer: Self-pay

## 2023-10-18 ENCOUNTER — Ambulatory Visit: Payer: Self-pay | Admitting: Obstetrics and Gynecology

## 2023-10-18 LAB — RPR: RPR Ser Ql: NONREACTIVE

## 2023-10-18 LAB — HIV ANTIBODY (ROUTINE TESTING W REFLEX): HIV 1&2 Ab, 4th Generation: NONREACTIVE

## 2023-10-18 LAB — HEPATITIS B SURFACE ANTIGEN: Hepatitis B Surface Ag: NONREACTIVE

## 2023-10-18 LAB — HEPATITIS C ANTIBODY: Hepatitis C Ab: NONREACTIVE

## 2023-10-18 NOTE — Telephone Encounter (Signed)
 Pt LVM in triage line stating that she actually would like to go ahead and get the ball rolling for scheduling her surgery instead of waiting until PUS to be done first, PUS appt scheduled for 01/17/2024.  CC: Rebecca Rocha Please advise.

## 2023-10-19 ENCOUNTER — Encounter: Payer: Self-pay | Admitting: Obstetrics and Gynecology

## 2023-10-22 NOTE — Telephone Encounter (Signed)
 Also, telephone encounter sent on 6/5.  CC: [facility]

## 2023-10-23 ENCOUNTER — Ambulatory Visit (INDEPENDENT_AMBULATORY_CARE_PROVIDER_SITE_OTHER): Admitting: Obstetrics and Gynecology

## 2023-10-23 ENCOUNTER — Other Ambulatory Visit (HOSPITAL_COMMUNITY)
Admission: RE | Admit: 2023-10-23 | Discharge: 2023-10-23 | Disposition: A | Discharge status: Home / Self Care | Source: Ambulatory Visit | Attending: Obstetrics and Gynecology | Admitting: Obstetrics and Gynecology

## 2023-10-23 ENCOUNTER — Encounter: Payer: Self-pay | Admitting: Obstetrics and Gynecology

## 2023-10-23 VITALS — BP 98/60 | HR 71 | Temp 98.2°F | Wt 114.0 lb

## 2023-10-23 DIAGNOSIS — Z113 Encounter for screening for infections with a predominantly sexual mode of transmission: Secondary | ICD-10-CM | POA: Insufficient documentation

## 2023-10-23 DIAGNOSIS — Z124 Encounter for screening for malignant neoplasm of cervix: Secondary | ICD-10-CM | POA: Insufficient documentation

## 2023-10-23 DIAGNOSIS — R102 Pelvic and perineal pain: Secondary | ICD-10-CM

## 2023-10-23 DIAGNOSIS — N83201 Unspecified ovarian cyst, right side: Secondary | ICD-10-CM

## 2023-10-23 MED ORDER — CYCLOBENZAPRINE HCL 10 MG PO TABS
10.0000 mg | ORAL_TABLET | Freq: Three times a day (TID) | ORAL | 0 refills | Status: AC | PRN
Start: 2023-10-23 — End: ?

## 2023-10-23 NOTE — Assessment & Plan Note (Addendum)
 02/2023 TVUS with 2.0 cm right ovarian complex mass 08/30/23, CT scan showed 4.2cm right adnexal cyst (increased from Oct 2024 TVUS), mild colitis and periportal edema (normal CMP) 10/17/23 TVUS shows stable complex cyst of the right ovary.  Differential diagnosis includes hemorrhagic cyst versus endometrioma. Given pelvic pain offered medical management with COC versus surgical management with diagnostic lap and ovarian cystectomy. Patient would like to proceed with surgical management.  Recommend laparoscopic right ovarian cystectomy, possible oophorectomy with general endotracheal anesthesia. Reviewed benefits including removal of persistent complex ovarian cyst, minimizes the risk of rupture or torsion, may improve right pelvic pain.  Reviewed risk including infection, bleeding, damage to surrounding structures.  Reviewed outpatient procedure that will require transportation to hospital and 1-2 weeks out of work. NPO after midnight.  Preop checklist: Antibiotics: none DVT ppx: SCDs Postop visit: 2 week Additional clearance: none  Continue COC+ibuprofen  PRN, will send in flexeril  rx. Gabapentin  has not helped patient before.

## 2023-10-23 NOTE — Progress Notes (Signed)
 23 y.o. G68P0000 female with right ovarian cyst on COC, stable liver hemangioma, recurrent chlamydia infection, chronic myofascial pain/?fibromyalgia(managed by PCP), asthma here for chlamydia TOC and follow-up. Married.  Patient's last menstrual period was 09/27/2023 (approximate).   At annual, she reported: Having severe pelvic pain at night 2-3x/wk since Oct 2024. Taking ibuprofen  as needed Went to ED 4/17, CT scan showed 4.2cm right adnexal cyst (increased from Oct 2024 TVUS), mild colitis and periportal edema (normal CMP) 02/2023 TVUS with 2.0 cm right ovarian complex mass  Diagnosed with chlamydia at last appointment and started on COC. Believes chlamydia diagnosis came from her friend using her sex toys.  She has discarded all the toys at this point.  Started on Prozac and Seroquel at the beginning of this month.  Following with Dr. Lincoln Renshaw at [facility].  Seeing once a month.  Continue to have right sided RLQ pain, usually a 7/10. Often keeps her in bed. She would like to proceed with surgical management of cyst as she may lose her insurance next year- on her parent's insurance. No N/V, CP or SOB.  Last inhaler use >1 month ago. No prior intubations for asthma.  GYN HISTORY: Chlamydia, treated 02/2023 &03/2023   OB History  Gravida Para Term Preterm AB Living  0 0 0 0 0 0  SAB IAB Ectopic Multiple Live Births  0 0 0 0 0   Past Medical History:  Diagnosis Date   Acute calculous cholecystitis 11/26/2019   Allergy    Anxiety    Asthma    Chlamydia 02/2023   treatment sent.  needs TOC   Chronic headache disorder    Chronic upper back pain    Depression    Gastritis, chronic    Menstrual migraine    Panic attack    Sciatica    whole body   Sinusitis    Sleep apnea    Sore of lip 11/24/2021   STD (sexually transmitted disease)    chlamydia treated 10/24   Syncope and collapse    Vertigo of central origin, unspecified ear    Past Surgical History:   Procedure Laterality Date   CHOLECYSTECTOMY N/A 11/26/2019   Procedure: LAPAROSCOPIC CHOLECYSTECTOMY;  Surgeon: Derral Flick, MD;  Location: [facility];  Service: General;  Laterality: N/A;   Current Outpatient Medications on File Prior to Visit  Medication Sig Dispense Refill   albuterol  (PROVENTIL ) (2.5 MG/3ML) 0.083% nebulizer solution Take 3 mLs (2.5 mg total) by nebulization every 6 (six) hours as needed for wheezing or shortness of breath. 50 mL 0   albuterol  (VENTOLIN  HFA) 108 (90 Base) MCG/ACT inhaler Inhale 2 puffs into the lungs every 6 (six) hours as needed for wheezing or shortness of breath. 6.7 g 0   Cholecalciferol (VITAMIN D ) 50 MCG (2000 UT) tablet Take 1 tablet (2,000 Units total) by mouth daily. 90 tablet 3   drospirenone -ethinyl estradiol  (YASMIN  28) 3-0.03 MG tablet Take 1 tablet by mouth daily. 28 tablet 11   FLUoxetine (PROZAC) 10 MG tablet Take 10 mg by mouth daily.     ibuprofen  (ADVIL ) 800 MG tablet Take 1 tablet (800 mg total) by mouth every 8 (eight) hours as needed for up to 42 doses. 60 tablet 3   naproxen  (NAPROSYN ) 500 MG tablet Take 500 mg by mouth.     ondansetron  (ZOFRAN ) 4 MG tablet Take 1 tablet (4 mg total) by mouth every 8 (eight) hours as needed for nausea or vomiting. 15 tablet 0  QUEtiapine (SEROQUEL) 25 MG tablet Take by mouth.     No current facility-administered medications on file prior to visit.   No Known Allergies    PE Today's Vitals   10/23/23 1210  BP: 98/60  Pulse: 71  Temp: 98.2 F (36.8 C)  TempSrc: Oral  SpO2: 98%  Weight: 114 lb (51.7 kg)   Body mass index is 21.02 kg/m.  Physical Exam Vitals reviewed. Exam conducted with a chaperone present.  Constitutional:      General: She is not in acute distress.    Appearance: Normal appearance.  HENT:     Head: Normocephalic and atraumatic.     Nose: Nose normal.  Eyes:     Extraocular Movements: Extraocular movements intact.     Conjunctiva/sclera: Conjunctivae  normal.  Cardiovascular:     Rate and Rhythm: Normal rate and regular rhythm.     Heart sounds: No murmur heard.    No friction rub. No gallop.  Pulmonary:     Effort: Pulmonary effort is normal. No respiratory distress.     Breath sounds: Normal breath sounds. No stridor. No wheezing, rhonchi or rales.  Genitourinary:    General: Normal vulva.     Exam position: Lithotomy position.     Vagina: Normal. No vaginal discharge.     Cervix: Normal. No cervical motion tenderness, discharge or lesion.     Uterus: Normal. Not enlarged and not tender.      Adnexa: Right adnexa normal and left adnexa normal.  Musculoskeletal:        General: Normal range of motion.     Cervical back: Normal range of motion.  Neurological:     General: No focal deficit present.     Mental Status: She is alert.  Psychiatric:        Mood and Affect: Mood normal.        Behavior: Behavior normal.      Assessment and Plan:        Right ovarian cyst Assessment & Plan: 02/2023 TVUS with 2.0 cm right ovarian complex mass 08/30/23, CT scan showed 4.2cm right adnexal cyst (increased from Oct 2024 TVUS), mild colitis and periportal edema (normal CMP) 10/17/23 TVUS shows stable complex cyst of the right ovary.  Differential diagnosis includes hemorrhagic cyst versus endometrioma. Given pelvic pain offered medical management with COC versus surgical management with diagnostic lap and ovarian cystectomy. Patient would like to proceed with surgical management.  Recommend laparoscopic right ovarian cystectomy, possible oophorectomy with general endotracheal anesthesia. Reviewed benefits including removal of persistent complex ovarian cyst, minimizes the risk of rupture or torsion, may improve right pelvic pain.  Reviewed risk including infection, bleeding, damage to surrounding structures.  Reviewed outpatient procedure that will require transportation to hospital and 1-2 weeks out of work. NPO after midnight.  Preop  checklist: Antibiotics: none DVT ppx: SCDs Postop visit: 2 week Additional clearance: none  Continue COC+ibuprofen  PRN, will send in flexeril  rx. Gabapentin  has not helped patient before.  Orders: -     Ambulatory Referral For Surgery Scheduling  Pelvic pain Assessment & Plan: History notable for chronic myofascial pain/?fibromyalgia and chronic right-sided lower back pain noted in 2022 in 2024. Prescribed Lyrica  but insurance never covered. Completed PT for lower back pain in 2024. Now with chronic RLQ pelvic pain since ~October 2024 and 4cm complex ovarian cyst.  Orders: -     Ambulatory Referral For Surgery Scheduling -     Cyclobenzaprine  HCl; Take 1 tablet (10 mg total)  by mouth 3 (three) times daily as needed for muscle spasms.  Dispense: 30 tablet; Refill: 0  Screen for STD (sexually transmitted disease) -     Cytology - PAP  Cervical cancer screening -     Cytology - PAP    Jyra Lagares Hadassah Letters, MD

## 2023-10-23 NOTE — Assessment & Plan Note (Signed)
 History notable for chronic myofascial pain/?fibromyalgia and chronic right-sided lower back pain noted in 2022 in 2024. Prescribed Lyrica  but insurance never covered. Completed PT for lower back pain in 2024. Now with chronic RLQ pelvic pain since ~October 2024 and 4cm complex ovarian cyst.

## 2023-10-29 LAB — CYTOLOGY - PAP
Chlamydia: POSITIVE — AB
Comment: NEGATIVE
Comment: NEGATIVE
Comment: NEGATIVE
Comment: NORMAL
Diagnosis: UNDETERMINED — AB
High risk HPV: NEGATIVE
Neisseria Gonorrhea: NEGATIVE
Trichomonas: NEGATIVE

## 2023-10-30 ENCOUNTER — Ambulatory Visit: Payer: Self-pay | Admitting: Obstetrics and Gynecology

## 2023-11-02 MED ORDER — DOXYCYCLINE HYCLATE 100 MG PO TABS: 100.0000 mg | ORAL_TABLET | Freq: Two times a day (BID) | ORAL | 0 refills | Status: DC

## 2023-11-02 NOTE — Progress Notes (Signed)
 MyChart message read by patient.   Message sent with recommendations.

## 2023-11-11 ENCOUNTER — Other Ambulatory Visit: Payer: Self-pay | Admitting: Obstetrics and Gynecology

## 2023-11-13 ENCOUNTER — Encounter: Payer: Self-pay | Admitting: *Deleted

## 2023-11-13 NOTE — Telephone Encounter (Signed)
 Med refill request: norethindrone  5 mg Last OV: 10/23/23 Dr. Dallie Last AEX: 09/05/23 Dr. Dallie Next AEX: none scheduled Last MMG (if hormonal med) n/a RX d/c'd 03/2023  Refill sent to provider for approval or denial as appropriate.

## 2023-11-21 NOTE — Telephone Encounter (Signed)
 See surgery referral.   Encounter closed.

## 2023-11-26 ENCOUNTER — Encounter: Payer: Self-pay | Admitting: *Deleted

## 2023-11-27 ENCOUNTER — Encounter: Payer: Self-pay | Admitting: Obstetrics and Gynecology

## 2023-11-27 ENCOUNTER — Ambulatory Visit (INDEPENDENT_AMBULATORY_CARE_PROVIDER_SITE_OTHER): Admitting: Obstetrics and Gynecology

## 2023-11-27 VITALS — BP 102/60 | HR 62 | Temp 98.1°F | Ht 61.5 in | Wt 113.0 lb

## 2023-11-27 DIAGNOSIS — Z113 Encounter for screening for infections with a predominantly sexual mode of transmission: Secondary | ICD-10-CM | POA: Diagnosis not present

## 2023-11-27 DIAGNOSIS — N83201 Unspecified ovarian cyst, right side: Secondary | ICD-10-CM

## 2023-11-27 DIAGNOSIS — R102 Pelvic and perineal pain: Secondary | ICD-10-CM | POA: Diagnosis not present

## 2023-11-27 NOTE — Patient Instructions (Signed)
 Preoperative instructions: Nothing to eat or drink after midnight, unless instructed differently regarding clear liquids by the anesthesia team at [facility]. Do not take any medications on the day of surgery, except those listed below: NONE Please follow all other instructions as provided by our surgical scheduler at [facility] and the anesthesia team at [facility].  Postoperative instructions: Clean your incision daily with mild soapy water.  Sitz bath are helpful for wound healing.   Ensure that you thoroughly dry your wound following cleaning and keep dry throughout the day.  You can apply a thin layer of Aquaphor or Vaseline over your incision. Ice packs can be applied to your incision for up to 20 minutes at a time to help with pain and swelling. Take ibuprofen as prescribed and over-the-counter Tylenol as needed.

## 2023-11-27 NOTE — Assessment & Plan Note (Signed)
 Will proceed with diagnostic laparoscopy, Right ovarian cystectomy, possible oophorectomy.    Reviewed risks including bleeding, infection, and damage to surrounding organs and vessels.  Reviewed outpatient procedure that will require transportation to hospital and 1-2 weeks out of work. NPO after midnight.  Preop checklist: Antibiotics: none DVT ppx: SCDs Postop visit: 2 week Additional clearance: none

## 2023-11-27 NOTE — H&P (View-Only) (Signed)
 23 y.o. G32P0000 female with right ovarian cyst on COC, pelvic pain, stable liver hemangioma, recurrent chlamydia infection, chronic myofascial pain/?fibromyalgia(managed by PCP), asthma here for chlamydia TOC and follow-up. Married.  Scheduled 12/04/23. Mom is coming to procedure.  Patient's last menstrual period was 11/24/2023 (approximate). Period Duration (Days): 5 Period Pattern: Regular Menstrual Flow: Moderate Menstrual Control: Maxi pad, Tampon Dysmenorrhea: (!) Mild Dysmenorrhea Symptoms: Nausea, Diarrhea, Headache At annual, she reported: Having severe pelvic pain at night 2-3x/wk since Oct 2024. Taking ibuprofen  as needed Went to ED 4/17, CT scan showed 4.2cm right adnexal cyst (increased from Oct 2024 TVUS), mild colitis and periportal edema (normal CMP) 02/2023 TVUS with 2.0 cm right ovarian complex mass  Diagnosed with chlamydia at last appointment and started on COC. Believes chlamydia diagnosis came from her friend using her sex toys.  She has discarded all the toys at this point.  Started on Prozac and Seroquel at the beginning of this month.  Following with Dr. Vicci at [facility].  Seeing once a month.  Continue to have right sided RLQ pain, usually a 7/10. Often keeps her in bed. She would like to proceed with surgical management of cyst as she may lose her insurance next year- on her parent's insurance.   Today, she reports no N/V, CP or SOB. Last inhaler use >1 month ago. No prior intubations for asthma. Pain is still severe, has bouts where she is doubled over, 8.5/10 today. Had not taken anything for pain.  Not SA since June treatment for chlamydia, due to pain.  Her husband was not treated after most recent positive.  No PCP.  10/17/2023 TVUS:    Uterus: 8.7 x 4.2 x 3.8 cm, anteverted uterus. Endometrial thickness: 4.4 mm. Left ovary: 2.8 x 2.2 x 2.1 cm, normal-appearing. Right ovary: 4.7 x 4.6 x 4.3 cm, 4.3 cm complex cyst.  Ovarian blood  flow preserved. No free fluid.   Impression:  Stable 4.3 cm complex cyst of the right ovary.  Consider hemorrhagic cyst versus endometrioma.   Vera LULLA Pa, MD  GYN HISTORY: Chlamydia, treated 02/2023 &03/2023   OB History  Gravida Para Term Preterm AB Living  0 0 0 0 0 0  SAB IAB Ectopic Multiple Live Births  0 0 0 0 0   Past Medical History:  Diagnosis Date   Acute calculous cholecystitis 11/26/2019   Allergy    Anxiety    Asthma    Chlamydia 02/2023   treatment sent.  needs TOC   Chronic headache disorder    Chronic upper back pain    Depression    Gastritis, chronic    Menstrual migraine    Panic attack    Sciatica    whole body   Sinusitis    Sleep apnea    Sore of lip 11/24/2021   STD (sexually transmitted disease)    chlamydia treated 10/24   Syncope and collapse    Vertigo of central origin, unspecified ear    Past Surgical History:  Procedure Laterality Date   CHOLECYSTECTOMY N/A 11/26/2019   Procedure: LAPAROSCOPIC CHOLECYSTECTOMY;  Surgeon: Stevie Herlene Righter, MD;  Location: [facility];  Service: General;  Laterality: N/A;   Current Outpatient Medications on File Prior to Visit  Medication Sig Dispense Refill   albuterol  (PROVENTIL ) (2.5 MG/3ML) 0.083% nebulizer solution Take 3 mLs (2.5 mg total) by nebulization every 6 (six) hours as needed for wheezing or shortness of breath. 50 mL 0   albuterol  (VENTOLIN  HFA) 108 (  90 Base) MCG/ACT inhaler Inhale 2 puffs into the lungs every 6 (six) hours as needed for wheezing or shortness of breath. 6.7 g 0   Cholecalciferol (VITAMIN D ) 50 MCG (2000 UT) tablet Take 1 tablet (2,000 Units total) by mouth daily. 90 tablet 3   cyclobenzaprine  (FLEXERIL ) 10 MG tablet Take 1 tablet (10 mg total) by mouth 3 (three) times daily as needed for muscle spasms. 30 tablet 0   drospirenone -ethinyl estradiol  (YASMIN  28) 3-0.03 MG tablet Take 1 tablet by mouth daily. 28 tablet 11   FLUoxetine (PROZAC) 10 MG tablet Take 10 mg by  mouth daily.     ibuprofen  (ADVIL ) 800 MG tablet Take 1 tablet (800 mg total) by mouth every 8 (eight) hours as needed for up to 42 doses. 60 tablet 3   naproxen  (NAPROSYN ) 500 MG tablet Take 500 mg by mouth.     ondansetron  (ZOFRAN ) 4 MG tablet Take 1 tablet (4 mg total) by mouth every 8 (eight) hours as needed for nausea or vomiting. 15 tablet 0   QUEtiapine (SEROQUEL) 25 MG tablet Take by mouth.     No current facility-administered medications on file prior to visit.   No Known Allergies    PE Today's Vitals   11/27/23 1114  BP: 102/60  Pulse: 62  Temp: 98.1 F (36.7 C)  TempSrc: Oral  SpO2: 99%  Weight: 113 lb (51.3 kg)  Height: 5' 1.5 (1.562 m)   Body mass index is 21.01 kg/m.  Physical Exam Vitals reviewed.  Constitutional:      General: She is not in acute distress.    Appearance: Normal appearance.  HENT:     Head: Normocephalic and atraumatic.     Nose: Nose normal.  Eyes:     Extraocular Movements: Extraocular movements intact.     Conjunctiva/sclera: Conjunctivae normal.  Cardiovascular:     Rate and Rhythm: Normal rate and regular rhythm.     Heart sounds: No murmur heard.    No friction rub. No gallop.  Pulmonary:     Effort: Pulmonary effort is normal. No respiratory distress.     Breath sounds: Normal breath sounds. No stridor. No wheezing, rhonchi or rales.  Abdominal:     General: There is no distension.     Palpations: Abdomen is soft.     Tenderness: There is abdominal tenderness (mild in RLQ). There is no guarding or rebound.  Musculoskeletal:        General: Normal range of motion.     Cervical back: Normal range of motion.  Neurological:     General: No focal deficit present.     Mental Status: She is alert.  Psychiatric:        Mood and Affect: Mood normal.        Behavior: Behavior normal.      Assessment and Plan:        Pelvic pain Right ovarian cyst Assessment & Plan: Will proceed with diagnostic laparoscopy, Right ovarian  cystectomy, possible oophorectomy.    Reviewed risks including bleeding, infection, and damage to surrounding organs and vessels.  Reviewed outpatient procedure that will require transportation to hospital and 1-2 weeks out of work. NPO after midnight.  Preop checklist: Antibiotics: none DVT ppx: SCDs Postop visit: 2 week Additional clearance: none   Screen for STD (sexually transmitted disease) -     C. trachomatis/N. gonorrhoeae RNA for TOC  Will send in expedited partner therapy for husband.   Vera LULLA Pa, MD

## 2023-11-27 NOTE — Progress Notes (Signed)
 23 y.o. G32P0000 female with right ovarian cyst on COC, pelvic pain, stable liver hemangioma, recurrent chlamydia infection, chronic myofascial pain/?fibromyalgia(managed by PCP), asthma here for chlamydia TOC and follow-up. Married.  Scheduled 12/04/23. Mom is coming to procedure.  Patient's last menstrual period was 11/24/2023 (approximate). Period Duration (Days): 5 Period Pattern: Regular Menstrual Flow: Moderate Menstrual Control: Maxi pad, Tampon Dysmenorrhea: (!) Mild Dysmenorrhea Symptoms: Nausea, Diarrhea, Headache At annual, she reported: Having severe pelvic pain at night 2-3x/wk since Oct 2024. Taking ibuprofen  as needed Went to ED 4/17, CT scan showed 4.2cm right adnexal cyst (increased from Oct 2024 TVUS), mild colitis and periportal edema (normal CMP) 02/2023 TVUS with 2.0 cm right ovarian complex mass  Diagnosed with chlamydia at last appointment and started on COC. Believes chlamydia diagnosis came from her friend using her sex toys.  She has discarded all the toys at this point.  Started on Prozac and Seroquel at the beginning of this month.  [facility] with [facility].  Seeing once a month.  Continue to have right sided RLQ pain, usually a 7/10. Often keeps her in bed. She would like to proceed with surgical management of cyst as she may lose her insurance next year- on her parent's insurance.   Today, she reports no N/V, CP or SOB. Last inhaler use >1 month ago. No prior intubations for asthma. Pain is still severe, has bouts where she is doubled over, 8.5/10 today. Had not taken anything for pain.  Not SA since June treatment for chlamydia, due to pain.  Her husband was not treated after most recent positive.  No PCP.  10/17/2023 TVUS:    Uterus: 8.7 x 4.2 x 3.8 cm, anteverted uterus. Endometrial thickness: 4.4 mm. Left ovary: 2.8 x 2.2 x 2.1 cm, normal-appearing. Right ovary: 4.7 x 4.6 x 4.3 cm, 4.3 cm complex cyst.  Ovarian blood  flow preserved. No free fluid.   Impression:  Stable 4.3 cm complex cyst of the right ovary.  Consider hemorrhagic cyst versus endometrioma.   Vera LULLA Pa, MD  GYN HISTORY: Chlamydia, treated 02/2023 &03/2023   OB History  Gravida Para Term Preterm AB Living  0 0 0 0 0 0  SAB IAB Ectopic Multiple Live Births  0 0 0 0 0   Past Medical History:  Diagnosis Date   Acute calculous cholecystitis 11/26/2019   Allergy    Anxiety    Asthma    Chlamydia 02/2023   treatment sent.  needs TOC   Chronic headache disorder    Chronic upper back pain    Depression    Gastritis, chronic    Menstrual migraine    Panic attack    Sciatica    whole body   Sinusitis    Sleep apnea    Sore of lip 11/24/2021   STD (sexually transmitted disease)    chlamydia treated 10/24   Syncope and collapse    Vertigo of central origin, unspecified ear    Past Surgical History:  Procedure Laterality Date   CHOLECYSTECTOMY N/A 11/26/2019   Procedure: LAPAROSCOPIC CHOLECYSTECTOMY;  Surgeon: Stevie Herlene Righter, MD;  Location: [facility];  Service: General;  Laterality: N/A;   Current Outpatient Medications on File Prior to Visit  Medication Sig Dispense Refill   albuterol  (PROVENTIL ) (2.5 MG/3ML) 0.083% nebulizer solution Take 3 mLs (2.5 mg total) by nebulization every 6 (six) hours as needed for wheezing or shortness of breath. 50 mL 0   albuterol  (VENTOLIN  HFA) 108 (  90 Base) MCG/ACT inhaler Inhale 2 puffs into the lungs every 6 (six) hours as needed for wheezing or shortness of breath. 6.7 g 0   Cholecalciferol (VITAMIN D ) 50 MCG (2000 UT) tablet Take 1 tablet (2,000 Units total) by mouth daily. 90 tablet 3   cyclobenzaprine  (FLEXERIL ) 10 MG tablet Take 1 tablet (10 mg total) by mouth 3 (three) times daily as needed for muscle spasms. 30 tablet 0   drospirenone -ethinyl estradiol  (YASMIN  28) 3-0.03 MG tablet Take 1 tablet by mouth daily. 28 tablet 11   FLUoxetine (PROZAC) 10 MG tablet Take 10 mg by  mouth daily.     ibuprofen  (ADVIL ) 800 MG tablet Take 1 tablet (800 mg total) by mouth every 8 (eight) hours as needed for up to 42 doses. 60 tablet 3   naproxen  (NAPROSYN ) 500 MG tablet Take 500 mg by mouth.     ondansetron  (ZOFRAN ) 4 MG tablet Take 1 tablet (4 mg total) by mouth every 8 (eight) hours as needed for nausea or vomiting. 15 tablet 0   QUEtiapine (SEROQUEL) 25 MG tablet Take by mouth.     No current facility-administered medications on file prior to visit.   No Known Allergies    PE Today's Vitals   11/27/23 1114  BP: 102/60  Pulse: 62  Temp: 98.1 F (36.7 C)  TempSrc: Oral  SpO2: 99%  Weight: 113 lb (51.3 kg)  Height: 5' 1.5 (1.562 m)   Body mass index is 21.01 kg/m.  Physical Exam Vitals reviewed.  Constitutional:      General: She is not in acute distress.    Appearance: Normal appearance.  HENT:     Head: Normocephalic and atraumatic.     Nose: Nose normal.  Eyes:     Extraocular Movements: Extraocular movements intact.     Conjunctiva/sclera: Conjunctivae normal.  Cardiovascular:     Rate and Rhythm: Normal rate and regular rhythm.     Heart sounds: No murmur heard.    No friction rub. No gallop.  Pulmonary:     Effort: Pulmonary effort is normal. No respiratory distress.     Breath sounds: Normal breath sounds. No stridor. No wheezing, rhonchi or rales.  Abdominal:     General: There is no distension.     Palpations: Abdomen is soft.     Tenderness: There is abdominal tenderness (mild in RLQ). There is no guarding or rebound.  Musculoskeletal:        General: Normal range of motion.     Cervical back: Normal range of motion.  Neurological:     General: No focal deficit present.     Mental Status: She is alert.  Psychiatric:        Mood and Affect: Mood normal.        Behavior: Behavior normal.      Assessment and Plan:        Pelvic pain Right ovarian cyst Assessment & Plan: Will proceed with diagnostic laparoscopy, Right ovarian  cystectomy, possible oophorectomy.    Reviewed risks including bleeding, infection, and damage to surrounding organs and vessels.  Reviewed outpatient procedure that will require transportation to hospital and 1-2 weeks out of work. NPO after midnight.  Preop checklist: Antibiotics: none DVT ppx: SCDs Postop visit: 2 week Additional clearance: none   Screen for STD (sexually transmitted disease) -     C. trachomatis/N. gonorrhoeae RNA for TOC  Will send in expedited partner therapy for husband.   Vera LULLA Pa, MD

## 2023-11-28 ENCOUNTER — Encounter (HOSPITAL_COMMUNITY): Payer: Self-pay | Admitting: Obstetrics and Gynecology

## 2023-11-28 ENCOUNTER — Telehealth: Payer: Self-pay | Admitting: *Deleted

## 2023-11-28 LAB — C. TRACHOMATIS/N. GONORRHOEAE RNA
C. trachomatis RNA, TMA: DETECTED — AB
N. gonorrhoeae RNA, TMA: NOT DETECTED

## 2023-11-28 NOTE — Telephone Encounter (Signed)
 Spoke with patient, confirmed partner information, NKDA and pharmacy. Advised Rx will be called in. Patient agreeable.   Call placed to [facility], spoke with Penne.  Verbal order provided: EPT for chlamydia for patient's partner, Gleen Bachelor, DOB 12/22/94 per protocol. PO Doxycycline  100mg  BID x7d, disp 14tab, refills 0.  Read back and confirmed.   Encounter closed.

## 2023-11-28 NOTE — Telephone Encounter (Signed)
-----   Message from Vera LULLA Pa sent at 11/27/2023  4:29 PM EDT ----- Please EPT for chlamydia for patient's partner, Gleen Bachelor, DOB 12/22/94 per protocol. PO Doxycycline  100mg  BID x7d, disp 14tab, refills 0.

## 2023-11-28 NOTE — Progress Notes (Signed)
 Spoke w/ via phone for pre-op interview--- Rebecca Rocha needs dos----   UPT per surgeon       Rocha results------ Current EKG in Epic dated 09/04/23 COVID test -----patient states asymptomatic no test needed Arrive at -------1000 NPO after MN NO Solid Food.  Clear liquids from MN until---0900 Pre-Surgery Ensure or G2:  Med rec completed Medications to take morning of surgery -----Bring Albuterol  inhaler Diabetic medication -----  GLP1 agonist last dose: GLP1 instructions:  Patient instructed no nail polish to be worn day of surgery Patient instructed to bring photo id and insurance card day of surgery Patient aware to have Driver (ride ) / caregiver    for 24 hours after surgery - Mother Rebecca Rocha Patient Special Instructions ----- Shower with antibacterial soap. Pre-Op special Instructions -----  Patient verbalized understanding of instructions that were given at this phone interview. Patient denies chest pain, sob, fever, cough at the interview.

## 2023-11-30 ENCOUNTER — Ambulatory Visit (HOSPITAL_BASED_OUTPATIENT_CLINIC_OR_DEPARTMENT_OTHER): Payer: Self-pay | Admitting: Obstetrics and Gynecology

## 2023-11-30 DIAGNOSIS — A749 Chlamydial infection, unspecified: Secondary | ICD-10-CM

## 2023-11-30 MED ORDER — AZITHROMYCIN 500 MG PO TABS: 1000.0000 mg | ORAL_TABLET | Freq: Once | ORAL | 0 refills | Status: AC

## 2023-12-03 DIAGNOSIS — Z01818 Encounter for other preprocedural examination: Secondary | ICD-10-CM

## 2023-12-04 ENCOUNTER — Ambulatory Visit (HOSPITAL_COMMUNITY)
Admission: RE | Admit: 2023-12-04 | Discharge: 2023-12-04 | Disposition: A | Discharge status: Home / Self Care | Attending: Obstetrics and Gynecology | Admitting: Obstetrics and Gynecology

## 2023-12-04 ENCOUNTER — Other Ambulatory Visit: Payer: Self-pay

## 2023-12-04 ENCOUNTER — Ambulatory Visit (HOSPITAL_COMMUNITY): Admitting: Anesthesiology

## 2023-12-04 ENCOUNTER — Encounter (HOSPITAL_COMMUNITY)
Admission: RE | Disposition: A | Discharge status: Home / Self Care | Payer: Self-pay | Source: Home / Self Care | Attending: Obstetrics and Gynecology

## 2023-12-04 ENCOUNTER — Encounter (HOSPITAL_COMMUNITY): Payer: Self-pay | Admitting: Obstetrics and Gynecology

## 2023-12-04 DIAGNOSIS — R102 Pelvic and perineal pain: Secondary | ICD-10-CM | POA: Insufficient documentation

## 2023-12-04 DIAGNOSIS — F418 Other specified anxiety disorders: Secondary | ICD-10-CM | POA: Diagnosis not present

## 2023-12-04 DIAGNOSIS — N83201 Unspecified ovarian cyst, right side: Secondary | ICD-10-CM | POA: Diagnosis present

## 2023-12-04 DIAGNOSIS — J45909 Unspecified asthma, uncomplicated: Secondary | ICD-10-CM | POA: Insufficient documentation

## 2023-12-04 DIAGNOSIS — Z01818 Encounter for other preprocedural examination: Secondary | ICD-10-CM

## 2023-12-04 HISTORY — PX: LAPAROSCOPIC OVARIAN CYSTECTOMY: SHX6248

## 2023-12-04 HISTORY — PX: LAPAROSCOPY: SHX197

## 2023-12-04 LAB — POCT PREGNANCY, URINE: Preg Test, Ur: NEGATIVE

## 2023-12-04 SURGERY — EXCISION, CYST, OVARY, LAPAROSCOPIC
Anesthesia: General | Site: Pelvis | Laterality: Right

## 2023-12-04 MED ORDER — SCOPOLAMINE 1 MG/3DAYS TD PT72
MEDICATED_PATCH | TRANSDERMAL | Status: DC
Start: 2023-12-04 — End: 2023-12-04
  Filled 2023-12-04: qty 1

## 2023-12-04 MED ORDER — ROCURONIUM BROMIDE 10 MG/ML (PF) SYRINGE
PREFILLED_SYRINGE | INTRAVENOUS | Status: DC | PRN
  Administered 2023-12-04: 60 mg via INTRAVENOUS
  Administered 2023-12-04: 40 mg via INTRAVENOUS

## 2023-12-04 MED ORDER — KETOROLAC TROMETHAMINE 30 MG/ML IJ SOLN
INTRAMUSCULAR | Status: DC | PRN
  Administered 2023-12-04: 30 mg via INTRAVENOUS

## 2023-12-04 MED ORDER — SCOPOLAMINE 1 MG/3DAYS TD PT72
1.0000 | MEDICATED_PATCH | TRANSDERMAL | Status: DC
  Administered 2023-12-04: 1.5 mg via TRANSDERMAL

## 2023-12-04 MED ORDER — AZITHROMYCIN 500 MG PO TABS
1000.0000 mg | ORAL_TABLET | Freq: Once | ORAL | Status: AC
  Administered 2023-12-04: 1000 mg via ORAL
  Filled 2023-12-04: qty 2

## 2023-12-04 MED ORDER — MIDAZOLAM HCL 2 MG/2ML IJ SOLN
INTRAMUSCULAR | Status: DC | PRN
  Administered 2023-12-04: 2 mg via INTRAVENOUS

## 2023-12-04 MED ORDER — CHLORHEXIDINE GLUCONATE 0.12 % MT SOLN
15.0000 mL | Freq: Once | OROMUCOSAL | Status: AC
  Administered 2023-12-04: 15 mL via OROMUCOSAL

## 2023-12-04 MED ORDER — 0.9 % SODIUM CHLORIDE (POUR BTL) OPTIME
TOPICAL | Status: DC | PRN
  Administered 2023-12-04: 1000 mL

## 2023-12-04 MED ORDER — DEXAMETHASONE SODIUM PHOSPHATE 10 MG/ML IJ SOLN
INTRAMUSCULAR | Status: AC
  Filled 2023-12-04: qty 1

## 2023-12-04 MED ORDER — SUGAMMADEX SODIUM 200 MG/2ML IV SOLN
INTRAVENOUS | Status: DC | PRN
  Administered 2023-12-04: 200 mg via INTRAVENOUS

## 2023-12-04 MED ORDER — LACTATED RINGERS IV SOLN: INTRAVENOUS | Status: DC

## 2023-12-04 MED ORDER — FENTANYL CITRATE (PF) 250 MCG/5ML IJ SOLN
INTRAMUSCULAR | Status: DC | PRN
  Administered 2023-12-04 (×2): 50 ug via INTRAVENOUS
  Administered 2023-12-04: 100 ug via INTRAVENOUS

## 2023-12-04 MED ORDER — BUPIVACAINE HCL (PF) 0.5 % IJ SOLN
INTRAMUSCULAR | Status: DC | PRN
  Administered 2023-12-04: 15 mL

## 2023-12-04 MED ORDER — HEMOSTATIC AGENTS (NO CHARGE) OPTIME
TOPICAL | Status: DC | PRN
Start: 2023-12-04 — End: 2023-12-04
  Administered 2023-12-04: 1

## 2023-12-04 MED ORDER — LIDOCAINE 2% (20 MG/ML) 5 ML SYRINGE
INTRAMUSCULAR | Status: DC | PRN
  Administered 2023-12-04: 40 mg via INTRAVENOUS

## 2023-12-04 MED ORDER — PROPOFOL 10 MG/ML IV BOLUS
INTRAVENOUS | Status: DC | PRN
  Administered 2023-12-04: 160 mg via INTRAVENOUS
  Administered 2023-12-04: 40 mg via INTRAVENOUS

## 2023-12-04 MED ORDER — ACETAMINOPHEN 500 MG PO TABS
1000.0000 mg | ORAL_TABLET | ORAL | Status: AC
Start: 2023-12-04 — End: 2023-12-04
  Administered 2023-12-04: 1000 mg via ORAL

## 2023-12-04 MED ORDER — AMISULPRIDE (ANTIEMETIC) 5 MG/2ML IV SOLN: 10.0000 mg | Freq: Once | INTRAVENOUS | Status: DC | PRN

## 2023-12-04 MED ORDER — ACETAMINOPHEN 500 MG PO TABS
ORAL_TABLET | ORAL | Status: AC
  Filled 2023-12-04: qty 2

## 2023-12-04 MED ORDER — DEXMEDETOMIDINE HCL IN NACL 80 MCG/20ML IV SOLN
INTRAVENOUS | Status: DC | PRN
Start: 2023-12-04 — End: 2023-12-04
  Administered 2023-12-04: 4 ug via INTRAVENOUS
  Administered 2023-12-04: 8 ug via INTRAVENOUS

## 2023-12-04 MED ORDER — DEXAMETHASONE SODIUM PHOSPHATE 10 MG/ML IJ SOLN
INTRAMUSCULAR | Status: DC | PRN
  Administered 2023-12-04: 10 mg via INTRAVENOUS

## 2023-12-04 MED ORDER — AZITHROMYCIN 1 G PO PACK
1.0000 g | PACK | Freq: Once | ORAL | Status: DC
  Filled 2023-12-04: qty 1

## 2023-12-04 MED ORDER — MEPERIDINE HCL 25 MG/ML IJ SOLN: 6.2500 mg | INTRAMUSCULAR | Status: DC | PRN

## 2023-12-04 MED ORDER — HYDROMORPHONE HCL 1 MG/ML IJ SOLN: 0.2500 mg | INTRAMUSCULAR | Status: DC | PRN

## 2023-12-04 MED ORDER — OXYCODONE HCL 5 MG PO TABS: 5.0000 mg | ORAL_TABLET | Freq: Once | ORAL | Status: DC | PRN

## 2023-12-04 MED ORDER — LIDOCAINE 2% (20 MG/ML) 5 ML SYRINGE
INTRAMUSCULAR | Status: AC
  Filled 2023-12-04: qty 5

## 2023-12-04 MED ORDER — KETOROLAC TROMETHAMINE 30 MG/ML IJ SOLN: 30.0000 mg | Freq: Once | INTRAMUSCULAR | Status: DC | PRN

## 2023-12-04 MED ORDER — ACETAMINOPHEN 500 MG PO TABS: 1000.0000 mg | ORAL_TABLET | Freq: Three times a day (TID) | ORAL | 0 refills | Status: AC | PRN

## 2023-12-04 MED ORDER — ONDANSETRON HCL 4 MG/2ML IJ SOLN: 4.0000 mg | Freq: Once | INTRAMUSCULAR | Status: DC | PRN

## 2023-12-04 MED ORDER — CHLORHEXIDINE GLUCONATE 0.12 % MT SOLN
OROMUCOSAL | Status: AC
  Filled 2023-12-04: qty 15

## 2023-12-04 MED ORDER — PROPOFOL 10 MG/ML IV BOLUS
INTRAVENOUS | Status: AC
  Filled 2023-12-04: qty 20

## 2023-12-04 MED ORDER — MIDAZOLAM HCL 2 MG/2ML IJ SOLN
INTRAMUSCULAR | Status: AC
  Filled 2023-12-04: qty 2

## 2023-12-04 MED ORDER — ROCURONIUM BROMIDE 10 MG/ML (PF) SYRINGE
PREFILLED_SYRINGE | INTRAVENOUS | Status: AC
  Filled 2023-12-04: qty 10

## 2023-12-04 MED ORDER — ONDANSETRON HCL 4 MG/2ML IJ SOLN
INTRAMUSCULAR | Status: DC | PRN
  Administered 2023-12-04: 4 mg via INTRAVENOUS

## 2023-12-04 MED ORDER — OXYCODONE HCL 5 MG/5ML PO SOLN: 5.0000 mg | Freq: Once | ORAL | Status: DC | PRN

## 2023-12-04 MED ORDER — ORAL CARE MOUTH RINSE: 15.0000 mL | Freq: Once | OROMUCOSAL | Status: AC

## 2023-12-04 MED ORDER — FENTANYL CITRATE (PF) 250 MCG/5ML IJ SOLN
INTRAMUSCULAR | Status: AC
  Filled 2023-12-04: qty 5

## 2023-12-04 SURGICAL SUPPLY — 30 items
APPLICATOR ARISTA FLEXITIP XL (MISCELLANEOUS) ×1 IMPLANT
CHLORAPREP W/TINT 26 (MISCELLANEOUS) ×3 IMPLANT
COVER MAYO STAND STRL (DRAPES) ×3 IMPLANT
DERMABOND ADVANCED .7 DNX12 (GAUZE/BANDAGES/DRESSINGS) ×1 IMPLANT
DRAPE SURG IRRIG POUCH 19X23 (DRAPES) ×3 IMPLANT
GAUZE 4X4 16PLY ~~LOC~~+RFID DBL (SPONGE) IMPLANT
GLOVE BIO SURGEON STRL SZ7 (GLOVE) ×3 IMPLANT
GLOVE BIOGEL PI IND STRL 7.0 (GLOVE) ×6 IMPLANT
GOWN STRL REUS W/ TWL LRG LVL3 (GOWN DISPOSABLE) ×3 IMPLANT
HEMOSTAT ARISTA ABSORB 3G PWDR (HEMOSTASIS) ×1 IMPLANT
IRRIGATION SUCT STRKRFLW 2 WTP (MISCELLANEOUS) IMPLANT
KIT PINK PAD W/HEAD ARM REST (MISCELLANEOUS) ×3 IMPLANT
KIT TURNOVER KIT B (KITS) ×3 IMPLANT
LIGASURE LAP MARYLAND 5MM 37CM (ELECTROSURGICAL) ×1 IMPLANT
LIGASURE VESSEL 5MM BLUNT TIP (ELECTROSURGICAL) IMPLANT
NS IRRIG 1000ML POUR BTL (IV SOLUTION) ×3 IMPLANT
PACK LAPAROSCOPY BASIN (CUSTOM PROCEDURE TRAY) ×3 IMPLANT
PAD OB MATERNITY 11 LF (PERSONAL CARE ITEMS) ×3 IMPLANT
SET TUBE SMOKE EVAC HIGH FLOW (TUBING) ×3 IMPLANT
SLEEVE ADV FIXATION 5X100MM (TROCAR) IMPLANT
SUT MNCRL AB 4-0 PS2 18 (SUTURE) ×3 IMPLANT
SUT VICRYL 0 UR6 27IN ABS (SUTURE) IMPLANT
SYR 30ML LL (SYRINGE) ×1 IMPLANT
SYSTEM BAG RETRIEVAL 10MM (BASKET) IMPLANT
TOWEL GREEN STERILE FF (TOWEL DISPOSABLE) ×3 IMPLANT
TRAY FOLEY W/BAG SLVR 14FR (SET/KITS/TRAYS/PACK) ×3 IMPLANT
TROCAR ADV FIXATION 11X100MM (TROCAR) IMPLANT
TROCAR ADV FIXATION 5X100MM (TROCAR) ×3 IMPLANT
TROCAR KII 8X100ML NONTHREADED (TROCAR) ×1 IMPLANT
WARMER LAPAROSCOPE (MISCELLANEOUS) ×3 IMPLANT

## 2023-12-04 NOTE — Transfer of Care (Signed)
 Immediate Anesthesia Transfer of Care Note  Patient: Rebecca Rocha  Procedure(s) Performed: DRAINAGE, CYST, OVARY, LAPAROSCOPIC (Right: Pelvis) LAPAROSCOPY, DIAGNOSTIC (Pelvis)  Patient Location: PACU  Anesthesia Type:General  Level of Consciousness: drowsy and patient cooperative  Airway & Oxygen Therapy: Patient Spontanous Breathing  Post-op Assessment: Report given to RN and Post -op Vital signs reviewed and stable  Post vital signs: Reviewed and stable  Last Vitals:  Vitals Value Taken Time  BP 98/65 12/04/23 15:01  Temp    Pulse 68 12/04/23 15:02  Resp 21 12/04/23 15:02  SpO2 100 % 12/04/23 15:02  Vitals shown include unfiled device data.  Last Pain:  Vitals:   12/04/23 1000  TempSrc: Oral  PainSc: 10-Worst pain ever      Patients Stated Pain Goal: 5 (12/04/23 1000)  Complications: No notable events documented.

## 2023-12-04 NOTE — Anesthesia Procedure Notes (Signed)
 Procedure Name: Intubation Date/Time: 12/04/2023 1:24 PM  Performed by: Marva Lonni PARAS, CRNAPre-anesthesia Checklist: Patient identified, Emergency Drugs available, Suction available and Patient being monitored Patient Re-evaluated:Patient Re-evaluated prior to induction Oxygen Delivery Method: Circle System Utilized Preoxygenation: Pre-oxygenation with 100% oxygen Induction Type: IV induction Ventilation: Mask ventilation without difficulty Laryngoscope Size: Mac and 3 Grade View: Grade I Tube type: Oral Tube size: 7.0 mm Number of attempts: 1 Airway Equipment and Method: Stylet and Oral airway Placement Confirmation: ETT inserted through vocal cords under direct vision, positive ETCO2 and breath sounds checked- equal and bilateral Secured at: 21 cm Tube secured with: Tape Dental Injury: Teeth and Oropharynx as per pre-operative assessment

## 2023-12-04 NOTE — Op Note (Addendum)
 12/04/23  [id number] Rebecca Rocha  OPERATIVE REPORT  Preop Diagnosis: Right ovarian cyst, pelvic pain Post op Diagnosis: Same Procedure: Procedure(s): LAPAROSCOPY, DIAGNOSTIC, DRAINAGE, CYST, OVARY, LAPAROSCOPIC    Surgeon: Vera LULLA Pa, MD  Assistant: Circulator: Renay Emerick DASEN, RN Scrub Person: Arbutus Suzen HERO   Fluids: please see anesthesia report   Complications: None Anesthesia: General    Findings:  Normal uterus, tubes. Small simple cyst of right ovary. Normal appendix and liver edge. Estimated blood loss: Minimal <15cc   Specimens: None   PROCEDURE IN DETAIL:  The patient had sequential compression devices applied to her lower extremities while in the preoperative area.  She was then taken to the operating room where general anesthesia was administered and was found to be adequate.  She was placed in the dorsal lithotomy position, and was prepped and draped in a sterile manner.  After an adequate timeout was performed, a foley catheter was inserted into her bladder and attached to constant drainage.  A Hulka tenaculum was placed on the cervix, as a means of moving the uterus throughout the procedure.  Attention was then turned to the patient's abdomen where a 8mm skin incision was made at the umbilicus. Two attempts were made to place the trocar under direct visualization but due to tight fascia were unsuccessful. The Veress needle was inserted at the umbilicus without complication. Pneumoperitoneum was obtained and the 8mm trocar was placed under direct visualization. The patient was then placed in trendelenburg. A survey of the patient's pelvis and abdomen revealed the findings above.  Two 5 mm ports were in bilateral lower quadrants under direct visualization. Simple cyst of the right ovary was drained- clear fluid was noted and was not sent due to limited amount and low risk for ovarian malignancy. Arista was placed over drainage site.  The operative site  was surveyed, and it was found to be hemostatic. The abdomen was desufflated and all trocars were then removed from the patient's abdomen under direct visualization. Local anesthetic was injected at all trocar sites. All skin incisions were closed with 4-0 monocryl with a subcuticular stitches/Dermabond. All instruments were removed.  The foley and Hulka tenaculum were removed as well.  Sponge, laps, instrument count were correct x2.  Patient was taken to recover in stable condition.   The patient will be discharged to home as per PACU criteria.  Routine postoperative instructions given.    Vera LULLA Pa, MD

## 2023-12-04 NOTE — Discharge Instructions (Signed)

## 2023-12-04 NOTE — Anesthesia Preprocedure Evaluation (Addendum)
 Anesthesia Evaluation  Patient identified by MRN, date of birth, ID band Patient awake    Reviewed: Allergy & Precautions, NPO status , Patient's Chart, lab work & pertinent test results  Airway Mallampati: I  TM Distance: >3 FB Neck ROM: Full    Dental  (+) Teeth Intact, Dental Advisory Given   Pulmonary asthma (well controlled, hasnt used in a few months)    Pulmonary exam normal breath sounds clear to auscultation       Cardiovascular negative cardio ROS Normal cardiovascular exam Rhythm:Regular Rate:Normal     Neuro/Psych  Headaches PSYCHIATRIC DISORDERS Anxiety Depression       GI/Hepatic negative GI ROS, Neg liver ROS,,,  Endo/Other  negative endocrine ROS    Renal/GU negative Renal ROS  negative genitourinary   Musculoskeletal negative musculoskeletal ROS (+)    Abdominal   Peds  Hematology   Anesthesia Other Findings   Reproductive/Obstetrics negative OB ROS                              Anesthesia Physical Anesthesia Plan  ASA: 2  Anesthesia Plan: General   Post-op Pain Management: Tylenol  PO (pre-op)*, Precedex , Ketamine IV*, Dilaudid  IV and Toradol  IV (intra-op)*   Induction: Intravenous  PONV Risk Score and Plan: 3 and Ondansetron , Dexamethasone , Midazolam  and Treatment may vary due to age or medical condition  Airway Management Planned: Oral ETT  Additional Equipment: None  Intra-op Plan:   Post-operative Plan: Extubation in OR  Informed Consent: I have reviewed the patients History and Physical, chart, labs and discussed the procedure including the risks, benefits and alternatives for the proposed anesthesia with the patient or authorized representative who has indicated his/her understanding and acceptance.     Dental advisory given  Plan Discussed with: CRNA  Anesthesia Plan Comments:          Anesthesia Quick Evaluation

## 2023-12-04 NOTE — Interval H&P Note (Signed)
 History and Physical Interval Note:  12/04/2023 12:11 PM  Rebecca Rocha  has presented today for surgery, with the diagnosis of Right ovarian cyst, pelvic pain.  The various methods of treatment have been discussed with the patient and family. After consideration of risks, benefits and other options for treatment, the patient has consented to  Procedure(s): EXCISION, CYST, OVARY, LAPAROSCOPIC (Right) OOPHORECTOMY, LAPAROSCOPIC (Right) as a surgical intervention.  The patient's history has been reviewed, patient examined, no change in status, stable for surgery.  I have reviewed the patient's chart and labs.  Questions were answered to the patient's satisfaction.     Cameryn Schum LULLA Pa

## 2023-12-05 ENCOUNTER — Encounter (HOSPITAL_COMMUNITY): Payer: Self-pay | Admitting: Obstetrics and Gynecology

## 2023-12-05 NOTE — Anesthesia Postprocedure Evaluation (Signed)
 Anesthesia Post Note  Patient: Rebecca Rocha  Procedure(s) Performed: DRAINAGE, CYST, OVARY, LAPAROSCOPIC (Right: Pelvis) LAPAROSCOPY, DIAGNOSTIC (Pelvis)     Patient location during evaluation: PACU Anesthesia Type: General Level of consciousness: awake and alert Pain management: pain level controlled Vital Signs Assessment: post-procedure vital signs reviewed and stable Respiratory status: spontaneous breathing, nonlabored ventilation and respiratory function stable Cardiovascular status: blood pressure returned to baseline and stable Postop Assessment: no apparent nausea or vomiting Anesthetic complications: no   No notable events documented.  Last Vitals:  Vitals:   12/04/23 1540 12/04/23 1544  BP:    Pulse: 71 (!) 57  Resp: 18 17  Temp:  36.7 C  SpO2: 99% 100%    Last Pain:  Vitals:   12/04/23 1000  TempSrc: Oral  PainSc: 10-Worst pain ever                 [name]

## 2023-12-18 ENCOUNTER — Encounter: Payer: Self-pay | Admitting: Obstetrics and Gynecology

## 2023-12-18 ENCOUNTER — Ambulatory Visit (INDEPENDENT_AMBULATORY_CARE_PROVIDER_SITE_OTHER): Admitting: Obstetrics and Gynecology

## 2023-12-18 VITALS — BP 102/62 | HR 89 | Temp 98.0°F | Wt 112.0 lb

## 2023-12-18 DIAGNOSIS — Z09 Encounter for follow-up examination after completed treatment for conditions other than malignant neoplasm: Secondary | ICD-10-CM

## 2023-12-18 NOTE — Progress Notes (Signed)
 23 y.o. G39P0000 female with right ovarian cyst on COC, pelvic pain, stable liver hemangioma, recurrent chlamydia infection, chronic myofascial pain/?fibromyalgia(managed by PCP), asthma here for postop exam: 2wk s/p dx lap, ovarian cyst drainage. Married.  Patient's last menstrual period was 11/24/2023 (approximate).   At annual, she reported: Having severe pelvic pain at night 2-3x/wk since Oct 2024. Taking ibuprofen  as needed Went to ED 4/17, CT scan showed 4.2cm right adnexal cyst (increased from Oct 2024 TVUS), mild colitis and periportal edema (normal CMP) 02/2023 TVUS with 2.0 cm right ovarian complex mass  Diagnosed with chlamydia at last appointment and started on COC. Believes chlamydia diagnosis came from her friend using her sex toys.  She has discarded all the toys at this point.  Started on Prozac and Seroquel at the beginning of this month.  Following with [facility].  Seeing once a month.  Continue to have right sided RLQ pain, usually a 7/10. Often keeps her in bed. She would like to proceed with surgical management of cyst as she may lose her insurance next year- on her parent's insurance.   Today, she reports driving is uncomfortable. Pain free for 1 wk for the first time in a long time! Still has skin glue present. Denies N/V, VB, dysuria. Normal BM and voids.   GYN HISTORY: Chlamydia, treated 02/2023 &03/2023   OB History  Gravida Para Term Preterm AB Living  0 0 0 0 0 0  SAB IAB Ectopic Multiple Live Births  0 0 0 0 0   Past Medical History:  Diagnosis Date   Acute calculous cholecystitis 11/26/2019   Allergy    Anxiety    Asthma    Chlamydia 02/2023   treatment sent.  needs TOC   Chronic headache disorder    Chronic upper back pain    Depression    Gastritis, chronic    Menstrual migraine    Panic attack    Sciatica    whole body   Sinusitis    Sore of lip 11/24/2021   STD (sexually transmitted disease)    chlamydia  treated 10/24   Syncope and collapse    a couple months ago, pt feels was related to mental issues, was taken to hospital   Vertigo of central origin, unspecified ear    Past Surgical History:  Procedure Laterality Date   CHOLECYSTECTOMY N/A 11/26/2019   Procedure: LAPAROSCOPIC CHOLECYSTECTOMY;  Surgeon: Kinsinger, Herlene Righter, MD;  Location: [facility];  Service: General;  Laterality: N/A;   LAPAROSCOPIC OVARIAN CYSTECTOMY Right 12/04/2023   Procedure: DRAINAGE, CYST, OVARY, LAPAROSCOPIC;  Surgeon: Dallie Vera GAILS, MD;  Location: [facility];  Service: Gynecology;  Laterality: Right;   LAPAROSCOPY  12/04/2023   Procedure: LAPAROSCOPY, DIAGNOSTIC;  Surgeon: Dallie Vera GAILS, MD;  Location: [facility];  Service: Gynecology;;   Current Outpatient Medications on File Prior to Visit  Medication Sig Dispense Refill   acetaminophen  (TYLENOL ) 500 MG tablet Take 2 tablets (1,000 mg total) by mouth every 8 (eight) hours as needed. 90 tablet 0   albuterol  (PROVENTIL ) (2.5 MG/3ML) 0.083% nebulizer solution Take 3 mLs (2.5 mg total) by nebulization every 6 (six) hours as needed for wheezing or shortness of breath. 50 mL 0   albuterol  (VENTOLIN  HFA) 108 (90 Base) MCG/ACT inhaler Inhale 2 puffs into the lungs every 6 (six) hours as needed for wheezing or shortness of breath. 6.7 g 0   Cholecalciferol (VITAMIN D ) 50 MCG (2000 UT) tablet Take 1  tablet (2,000 Units total) by mouth daily. 90 tablet 3   cyclobenzaprine  (FLEXERIL ) 10 MG tablet Take 1 tablet (10 mg total) by mouth 3 (three) times daily as needed for muscle spasms. 30 tablet 0   drospirenone -ethinyl estradiol  (YASMIN  28) 3-0.03 MG tablet Take 1 tablet by mouth daily. 28 tablet 11   FLUoxetine (PROZAC) 10 MG tablet Take 10 mg by mouth daily.     ibuprofen  (ADVIL ) 800 MG tablet Take 1 tablet (800 mg total) by mouth every 8 (eight) hours as needed for up to 42 doses. 60 tablet 3   ondansetron  (ZOFRAN ) 4 MG tablet Take 1 tablet (4 mg total) by mouth every 8 (eight)  hours as needed for nausea or vomiting. 15 tablet 0   QUEtiapine (SEROQUEL) 25 MG tablet Take by mouth.     No current facility-administered medications on file prior to visit.   No Known Allergies    PE Today's Vitals   12/18/23 1057  BP: 102/62  Pulse: 89  Temp: 98 F (36.7 C)  TempSrc: Oral  SpO2: 99%  Weight: 112 lb (50.8 kg)    Body mass index is 20.82 kg/m.  Physical Exam Vitals reviewed.  Constitutional:      General: She is not in acute distress.    Appearance: Normal appearance.  HENT:     Head: Normocephalic and atraumatic.     Nose: Nose normal.  Eyes:     Extraocular Movements: Extraocular movements intact.     Conjunctiva/sclera: Conjunctivae normal.  Pulmonary:     Effort: Pulmonary effort is normal.  Abdominal:     General: There is no distension.     Palpations: Abdomen is soft.     Tenderness: There is no abdominal tenderness.     Comments: Incisions c/d/i  Musculoskeletal:        General: Normal range of motion.     Cervical back: Normal range of motion.  Neurological:     General: No focal deficit present.     Mental Status: She is alert.  Psychiatric:        Mood and Affect: Mood normal.        Behavior: Behavior normal.      Assessment and Plan:        Postop check  Normal postop exam.  Pathology reviewed with patient. Cleared to return to work. Vaseline to incisions to dissolved skin glue. All questions answered.    Vera LULLA Pa, MD

## 2024-01-17 ENCOUNTER — Other Ambulatory Visit: Admitting: Obstetrics and Gynecology

## 2024-01-17 ENCOUNTER — Other Ambulatory Visit

## 2024-02-07 ENCOUNTER — Telehealth: Payer: Self-pay | Admitting: *Deleted

## 2024-02-07 NOTE — Telephone Encounter (Signed)
-----   Message from Vera LULLA Pa sent at 02/07/2024  9:41 AM EDT ----- Regarding: RE: +chl with no retesting TOC at 12 wk ----- Message ----- From: Brutus Kate SAILOR, RN Sent: 02/07/2024   8:24 AM EDT To: Davina J Johnson, CMA; Genesis Pa LULLA, MD Subject: FW: +chl with no retesting                     Routing to Dr. Pa to review and advise.   [facility] ----- Message ----- From: Vicci Arvin PARAS, CMA Sent: 02/04/2024   3:10 PM EDT To: Kate SAILOR Brutus, RN Subject: +chl with no retesting                         Pt in pap recall. She had + chl June 2025, retested in July 2025 still postive. No appt scheduled or note stated when patient should have retesting done.

## 2024-02-07 NOTE — Telephone Encounter (Signed)
 Left message to call [facility] Triage at [phone], option 4.

## 2024-03-14 NOTE — Telephone Encounter (Signed)
 Spoke with patient, advised f/u on TOC. Patient request to schedule 05/2024. Patient declines any earlier appts. States she will call if her schedule changes or if any concerns, OV scheduled for 05/16/24 at 1030 with Dr. Dallie.   Routing to provider for final review. Patient is agreeable to disposition. Will close encounter.

## 2024-05-16 ENCOUNTER — Ambulatory Visit: Admitting: Obstetrics and Gynecology

## 2024-06-02 NOTE — Telephone Encounter (Signed)
 Positive Chlamydia 11/27/23, treated. Return for TOC 12 weeks.  Patient no show for TOC on 04/18/25 and 05/16/24  Call placed to patient, Left message to call Kate, RN at [facility], [phone], option 1 to reschedule missed appt.  Letter pended and printed. To Dr. Dallie to review.

## 2024-06-03 NOTE — Telephone Encounter (Signed)
 Letter also sent via MyChart.   Encounter closed.

## 2024-06-10 ENCOUNTER — Encounter: Payer: Self-pay | Admitting: Obstetrics and Gynecology

## 2024-06-18 ENCOUNTER — Telehealth: Payer: Self-pay | Admitting: Obstetrics and Gynecology

## 2024-06-18 NOTE — Telephone Encounter (Signed)
 Patient no show to her test of cure appointment and cancelled her ultrasound appointment. One message was left, Mychart message sent and letter mailed asking patient to call and reschedule both appointment; patient has not called back.

## 2024-06-19 NOTE — Telephone Encounter (Signed)
 Letter was mailed for [facility] on 06/02/24.   PUS ordered 10/17/23 for right ovarian cyst.   12/04/23: dx lap, ovarian cyst drainage   2 wk post-op 12/18/23  Dr. Dallie -please advise on PUS, is this still recommended?

## 2024-06-19 NOTE — Telephone Encounter (Signed)
 PUS cancelled.   Routing to [employer].   Encounter closed.
